# Patient Record
Sex: Female | Born: 1955 | Race: White | Hispanic: No | Smoking: Former smoker
Health system: Southern US, Community
[De-identification: ages and names within clinical notes are randomized; demographics above are authoritative.]

## PROBLEM LIST (undated history)

## (undated) DIAGNOSIS — K219 Gastro-esophageal reflux disease without esophagitis: Secondary | ICD-10-CM

## (undated) DIAGNOSIS — I739 Peripheral vascular disease, unspecified: Secondary | ICD-10-CM

## (undated) DIAGNOSIS — I219 Acute myocardial infarction, unspecified: Secondary | ICD-10-CM

## (undated) DIAGNOSIS — I509 Heart failure, unspecified: Secondary | ICD-10-CM

## (undated) DIAGNOSIS — I2510 Atherosclerotic heart disease of native coronary artery without angina pectoris: Secondary | ICD-10-CM

## (undated) DIAGNOSIS — I10 Essential (primary) hypertension: Secondary | ICD-10-CM

## (undated) DIAGNOSIS — E785 Hyperlipidemia, unspecified: Secondary | ICD-10-CM

## (undated) DIAGNOSIS — I5020 Unspecified systolic (congestive) heart failure: Secondary | ICD-10-CM

## (undated) DIAGNOSIS — I429 Cardiomyopathy, unspecified: Secondary | ICD-10-CM

## (undated) DIAGNOSIS — E119 Type 2 diabetes mellitus without complications: Secondary | ICD-10-CM

## (undated) HISTORY — DX: Heart failure, unspecified: I50.9

## (undated) HISTORY — DX: Atherosclerotic heart disease of native coronary artery without angina pectoris: I25.10

## (undated) HISTORY — PX: TONSILLECTOMY: SUR1361

## (undated) HISTORY — PX: CORONARY ARTERY BYPASS GRAFT: SHX141

## (undated) HISTORY — DX: Gastro-esophageal reflux disease without esophagitis: K21.9

## (undated) HISTORY — PX: CARDIAC DEFIBRILLATOR PLACEMENT: SHX171

## (undated) HISTORY — PX: PERCUTANEOUS CORONARY STENT INTERVENTION (PCI-S): SHX6016

## (undated) HISTORY — PX: CARDIAC CATHETERIZATION: SHX172

## (undated) HISTORY — DX: Essential (primary) hypertension: I10

## (undated) HISTORY — PX: CORONARY STENT PLACEMENT: SHX1402

## (undated) HISTORY — DX: Hyperlipidemia, unspecified: E78.5

## (undated) HISTORY — DX: Peripheral vascular disease, unspecified: I73.9

## (undated) HISTORY — PX: CATARACT EXTRACTION: SUR2

## (undated) HISTORY — DX: Unspecified systolic (congestive) heart failure: I50.20

## (undated) HISTORY — DX: Cardiomyopathy, unspecified: I42.9

## (undated) HISTORY — PX: COMBINED AUGMENTATION MAMMAPLASTY AND ABDOMINOPLASTY: SUR291

## (undated) HISTORY — DX: Type 2 diabetes mellitus without complications: E11.9

## (undated) HISTORY — DX: Acute myocardial infarction, unspecified: I21.9

---

## 2014-04-10 HISTORY — PX: BREAST BIOPSY: SHX20

## 2014-11-03 DIAGNOSIS — E1159 Type 2 diabetes mellitus with other circulatory complications: Secondary | ICD-10-CM | POA: Insufficient documentation

## 2014-11-03 DIAGNOSIS — I10 Essential (primary) hypertension: Secondary | ICD-10-CM | POA: Insufficient documentation

## 2015-04-11 HISTORY — PX: REDUCTION MAMMAPLASTY: SUR839

## 2017-08-08 HISTORY — PX: CORONARY ARTERY BYPASS GRAFT: SHX141

## 2018-12-30 ENCOUNTER — Ambulatory Visit: Payer: Self-pay | Admitting: Internal Medicine

## 2019-01-02 ENCOUNTER — Encounter: Payer: Self-pay | Admitting: *Deleted

## 2019-01-03 ENCOUNTER — Ambulatory Visit (INDEPENDENT_AMBULATORY_CARE_PROVIDER_SITE_OTHER): Payer: 59 | Admitting: Internal Medicine

## 2019-01-03 ENCOUNTER — Encounter: Payer: Self-pay | Admitting: Internal Medicine

## 2019-01-03 ENCOUNTER — Encounter: Payer: Self-pay | Admitting: Cardiology

## 2019-01-03 ENCOUNTER — Other Ambulatory Visit: Payer: Self-pay

## 2019-01-03 ENCOUNTER — Ambulatory Visit (INDEPENDENT_AMBULATORY_CARE_PROVIDER_SITE_OTHER): Payer: 59 | Admitting: Cardiology

## 2019-01-03 VITALS — BP 112/80 | HR 77 | Ht 63.0 in | Wt 181.0 lb

## 2019-01-03 DIAGNOSIS — E119 Type 2 diabetes mellitus without complications: Secondary | ICD-10-CM

## 2019-01-03 DIAGNOSIS — E7800 Pure hypercholesterolemia, unspecified: Secondary | ICD-10-CM

## 2019-01-03 DIAGNOSIS — Z95810 Presence of automatic (implantable) cardiac defibrillator: Secondary | ICD-10-CM | POA: Diagnosis not present

## 2019-01-03 DIAGNOSIS — I509 Heart failure, unspecified: Secondary | ICD-10-CM | POA: Diagnosis not present

## 2019-01-03 DIAGNOSIS — I2510 Atherosclerotic heart disease of native coronary artery without angina pectoris: Secondary | ICD-10-CM

## 2019-01-03 DIAGNOSIS — I252 Old myocardial infarction: Secondary | ICD-10-CM | POA: Insufficient documentation

## 2019-01-03 DIAGNOSIS — I10 Essential (primary) hypertension: Secondary | ICD-10-CM | POA: Insufficient documentation

## 2019-01-03 DIAGNOSIS — I429 Cardiomyopathy, unspecified: Secondary | ICD-10-CM

## 2019-01-03 DIAGNOSIS — I219 Acute myocardial infarction, unspecified: Secondary | ICD-10-CM | POA: Insufficient documentation

## 2019-01-03 DIAGNOSIS — K219 Gastro-esophageal reflux disease without esophagitis: Secondary | ICD-10-CM

## 2019-01-03 DIAGNOSIS — E785 Hyperlipidemia, unspecified: Secondary | ICD-10-CM | POA: Insufficient documentation

## 2019-01-03 NOTE — Assessment & Plan Note (Signed)
Compensated Reinforced DASH diet and exercise for weight loss Will schedule lab visit for CMET in 1 month Continue Carvedilol, Lisinopril and Furosemide

## 2019-01-03 NOTE — Assessment & Plan Note (Signed)
She will schedule lab visit in 1 month for Lipid and CMET Continue Rosuvastatin Encouraged low fat diet

## 2019-01-03 NOTE — Patient Instructions (Signed)

## 2019-01-03 NOTE — Assessment & Plan Note (Signed)
Controlled on Carvedilol, Furosemide and Lisinopril Reinforced DASH diet and exercise for weight loss Lab only appt in 1 month for CMET

## 2019-01-03 NOTE — Assessment & Plan Note (Signed)
Controlled on Carvedilol, Furosemide and Lisinopril Reinforced DASH diet and exercise for weight loss Lab only appt in 1 month for CMET 

## 2019-01-03 NOTE — Assessment & Plan Note (Signed)
Will have her schedule lab only appt in 1 month for A1c No microalbumin secondary to ACEI therapy Encouraged her to consume a low carb diet and exercise for weight loss Continue Metformin, Glimepiride and Invokana Foot exam today Eye exam UTD Flu shot today Will see if she has had a pneumonia vaccine

## 2019-01-03 NOTE — Assessment & Plan Note (Signed)
No angina Continue Rosuvastatin and ASA Established with cardiology today

## 2019-01-03 NOTE — Assessment & Plan Note (Signed)
Discussed avoiding foods that trigger your reflux Discussed how weight loss could help improve symptoms. Continue Tums PRN

## 2019-01-03 NOTE — Patient Instructions (Signed)
Medication Instructions:  Your physician recommends that you continue on your current medications as directed. Please refer to the Current Medication list given to you today.  If you need a refill on your cardiac medications before your next appointment, please call your pharmacy.   Lab work: NONE If you have labs (blood work) drawn today and your tests are completely normal, you will receive your results only by: Marland Kitchen MyChart Message (if you have MyChart) OR . A paper copy in the mail If you have any lab test that is abnormal or we need to change your treatment, we will call you to review the results.  Testing/Procedures: Your physician has requested that you have an echocardiogram. Echocardiography is a painless test that uses sound waves to create images of your heart. It provides your doctor with information about the size and shape of your heart and how well your heart's chambers and valves are working. This procedure takes approximately one hour. There are no restrictions for this procedure. You may get an IV, if needed, to receive an ultrasound enhancing agent through to better visualize your heart.    Follow-Up: You have been referred to Cardiac Electrophysiology - Dr Graciela Husbands.   At [facility], you and your health needs are our priority.  As part of our continuing mission to provide you with exceptional heart care, we have created designated Provider Care Teams.  These Care Teams include your primary Cardiologist (physician) and Advanced Practice Providers (APPs -  Physician Assistants and Nurse Practitioners) who all work together to provide you with the care you need, when you need it. You will need a follow up appointment in 2 months.   You may see Debbe Odea, MD or one of the following Advanced Practice Providers on your designated Care Team:   Nicolasa Ducking, NP Eula Listen, PA-C . Marisue Ivan, PA-C    Echocardiogram An echocardiogram is a procedure that uses  painless sound waves (ultrasound) to produce an image of the heart. Images from an echocardiogram can provide important information about:  Signs of coronary artery disease (CAD).  Aneurysm detection. An aneurysm is a weak or damaged part of an artery wall that bulges out from the normal force of blood pumping through the body.  Heart size and shape. Changes in the size or shape of the heart can be associated with certain conditions, including heart failure, aneurysm, and CAD.  Heart muscle function.  Heart valve function.  Signs of a past heart attack.  Fluid buildup around the heart.  Thickening of the heart muscle.  A tumor or infectious growth around the heart valves. Tell a health care provider about:  Any allergies you have.  All medicines you are taking, including vitamins, herbs, eye drops, creams, and over-the-counter medicines.  Any blood disorders you have.  Any surgeries you have had.  Any medical conditions you have.  Whether you are pregnant or may be pregnant. What are the risks? Generally, this is a safe procedure. However, problems may occur, including:  Allergic reaction to dye (contrast) that may be used during the procedure. What happens before the procedure? No specific preparation is needed. You may eat and drink normally. What happens during the procedure?   An IV tube may be inserted into one of your veins.  You may receive contrast through this tube. A contrast is an injection that improves the quality of the pictures from your heart.  A gel will be applied to your chest.  A wand-like tool (  transducer) will be moved over your chest. The gel will help to transmit the sound waves from the transducer.  The sound waves will harmlessly bounce off of your heart to allow the heart images to be captured in real-time motion. The images will be recorded on a computer. The procedure may vary among health care providers and hospitals. What happens after  the procedure?  You may return to your normal, everyday life, including diet, activities, and medicines, unless your health care provider tells you not to do that. Summary  An echocardiogram is a procedure that uses painless sound waves (ultrasound) to produce an image of the heart.  Images from an echocardiogram can provide important information about the size and shape of your heart, heart muscle function, heart valve function, and fluid buildup around your heart.  You do not need to do anything to prepare before this procedure. You may eat and drink normally.  After the echocardiogram is completed, you may return to your normal, everyday life, unless your health care provider tells you not to do that. This information is not intended to replace advice given to you by your health care provider. Make sure you discuss any questions you have with your health care provider. Document Released: 03/24/2000 Document Revised: 07/18/2018 Document Reviewed: 04/29/2016 Elsevier Patient Education  2020 [employer].

## 2019-01-03 NOTE — Progress Notes (Signed)
HPI  HTN with Cardiomyopathy: Her BP today is 110/68. She is taking Carvedilol, Furosemide and Lisinopril as prescribed. There is no ECG on file.  HLD with CAD, PAD s/p MI with Stents: She denies myalgias on Rosuvastatin. She is taking Carvedilol and ASA as well. She follows with cardiology.   CHF: She denies increased edema or SOB. She is taking Lisinopril, Furosemide and Carvedilol as prescribed. She is following with cardiology.   GERD: Triggered by spicy foods. She used to take Omeprazole but no longer take its. She takes Tums as needed with good relief.  DM 2: Her last A1C was 7.6%, 10/2018. She does not check her sugars routinely. She is taking Metformin, Glimepiride and Farxiga as prescribed. She checks her feet routinely. Her last eye exam was 05/2018.  Flu: 2019 Tetanus: unsure Pneumovax: unsure Shingrix: 2020 Mammogram: 2019 Pap Smear: mammogram Bone Density:> 2 years ago Colon Screening: > 10 years ago Vision Screening: annually Dentist: annually  Past Medical History:  Diagnosis Date  . Cardiomyopathy (HCC)   . CHF (congestive heart failure) (HCC)    Class I  . Coronary artery disease   . Diabetes mellitus without complication (HCC)   . GERD (gastroesophageal reflux disease)   . HFrEF (heart failure with reduced ejection fraction) (HCC)   . Hyperlipidemia   . Hypertension   . MI (myocardial infarction) (HCC)   . PAD (peripheral artery disease) (HCC)     Current Outpatient Medications  Medication Sig Dispense Refill  . aspirin EC 81 MG tablet Take 81 mg by mouth daily.    . carvedilol (COREG) 6.25 MG tablet Take 6.25 mg by mouth 2 (two) times daily with a meal.    . dapagliflozin propanediol (FARXIGA) 10 MG TABS tablet Take 10 mg by mouth daily.    . furosemide (LASIX) 40 MG tablet Take 40 mg by mouth every other day.    Marland Kitchen glimepiride (AMARYL) 2 MG tablet Take 2 mg by mouth daily with breakfast.    . lisinopril (ZESTRIL) 5 MG tablet Take 5 mg by mouth daily.     . metFORMIN (GLUCOPHAGE) 1000 MG tablet Take 1,000 mg by mouth 2 (two) times daily with a meal.    . rosuvastatin (CRESTOR) 40 MG tablet Take 40 mg by mouth daily.     No current facility-administered medications for this visit.     Allergies  Allergen Reactions  . Brilinta [Ticagrelor]     Family History  Problem Relation Age of Onset  . Heart attack Mother   . COPD Father   . Thyroid cancer Sister   . Breast cancer Sister   . Diabetes Sister   . Diabetes Brother     Social History   Socioeconomic History  . Marital status: Widowed    Spouse name: Not on file  . Number of children: Not on file  . Years of education: Not on file  . Highest education level: Not on file  Occupational History  . Not on file  Social Needs  . Financial resource strain: Not on file  . Food insecurity    Worry: Not on file    Inability: Not on file  . Transportation needs    Medical: Not on file    Non-medical: Not on file  Tobacco Use  . Smoking status: Former Smoker    Packs/day: 1.00    Years: 33.00    Pack years: 33.00  . Smokeless tobacco: Never Used  Substance and Sexual Activity  . Alcohol  use: Yes  . Drug use: Never  . Sexual activity: Not on file  Lifestyle  . Physical activity    Days per week: Not on file    Minutes per session: Not on file  . Stress: Not on file  Relationships  . Social Herbalist on phone: Not on file    Gets together: Not on file    Attends religious service: Not on file    Active member of club or organization: Not on file    Attends meetings of clubs or organizations: Not on file    Relationship status: Not on file  . Intimate partner violence    Fear of current or ex partner: Not on file    Emotionally abused: Not on file    Physically abused: Not on file    Forced sexual activity: Not on file  Other Topics Concern  . Not on file  Social History Narrative  . Not on file    ROS:  Constitutional: Denies fever, malaise,  fatigue, headache or abrupt weight changes.  HEENT: Denies eye pain, eye redness, ear pain, ringing in the ears, wax buildup, runny nose, nasal congestion, bloody nose, or sore throat. Respiratory: Denies difficulty breathing, shortness of breath, cough or sputum production.   Cardiovascular: Denies chest pain, chest tightness, palpitations or swelling in the hands or feet.  Gastrointestinal: Denies abdominal pain, bloating, constipation, diarrhea or blood in the stool.  GU: Denies frequency, urgency, pain with urination, blood in urine, odor or discharge. Musculoskeletal: Denies decrease in range of motion, difficulty with gait, muscle pain or joint pain and swelling.  Skin: Denies redness, rashes, lesions or ulcercations.  Neurological: Denies dizziness, difficulty with memory, difficulty with speech or problems with balance and coordination.  Psych: Denies anxiety, depression, SI/HI.  No other specific complaints in a complete review of systems (except as listed in HPI above).  PE:  BP 110/68   Pulse 85   Temp 98.3 F (36.8 C) (Temporal)   Ht 5' 3.25" (1.607 m)   Wt 181 lb (82.1 kg)   SpO2 98%   BMI 31.81 kg/m   Wt Readings from Last 3 Encounters:  01/03/19 181 lb (82.1 kg)    General: Appears her stated age, obese, in NAD. Skin: Dry and intact. Cardiovascular: Normal rate and rhythm. S1,S2 noted.  No murmur, rubs or gallops noted. No JVD or BLE edema. No carotid bruits noted. Pulmonary/Chest: Normal effort and positive vesicular breath sounds. No respiratory distress. No wheezes, rales or ronchi noted.  Abdomen: Soft and nontender. Normal bowel sounds MSK: No difficulty with gait. Neurological: Alert and oriented. Sensation intact to BLE. Psychiatric: Mood and affect normal. Behavior is normal. Judgment and thought content normal.    Assessment and Plan:

## 2019-01-03 NOTE — Progress Notes (Signed)
Cardiology Office Note:    Date:  01/03/2019   ID:  Monique Franklin, DOB 12/27/1955, MRN [id number]  PCP:  Lorre Munroe, NP  Cardiologist:  Debbe Odea, MD  Electrophysiologist:  None   Referring MD: No ref. provider found   Chief Complaint  Patient presents with  . New Patient (Initial Visit)    self referral. Patietn wants to establish care. Meds reviewed verbally with patient.    History of Present Illness:    Monique Franklin is a 63 y.o. female with a hx of hypertension, hyperlipidemia, CAD, PCI to LAD and LCx(5 stents total), status post CABG x2 (LIMA to LAD, SVG to OM 2019), HFrEF EF 31%, s/p ICD 2020(St. Jude's device), PAD(left femoral thrombo-endarterectomy with patch angioplasty 12/2017) who presents to establish care.  She used to be followed at [facility] and recently moved into the area.  She states having CAD and prior 5 stents.  Also had CABG last year and during the procedure her left femoral complication requiring left femoral patch angioplasty.  Earlier this year she had an ICD placed due to low EF with ejection fraction of 31%.  She is a former smoker for about 20 years.  She otherwise feels great, is able to walk about 2 miles with intermittent breaks.  She denies edema, takes her Lasix every other day.  Past Medical History:  Diagnosis Date  . Cardiomyopathy (HCC)   . CHF (congestive heart failure) (HCC)    Class I  . Coronary artery disease   . Diabetes mellitus without complication (HCC)   . GERD (gastroesophageal reflux disease)   . HFrEF (heart failure with reduced ejection fraction) (HCC)   . Hyperlipidemia   . Hypertension   . MI (myocardial infarction) (HCC)   . PAD (peripheral artery disease) (HCC)     Past Surgical History:  Procedure Laterality Date  . BREAST BIOPSY    . CARDIAC CATHETERIZATION    . CARDIAC DEFIBRILLATOR PLACEMENT    . COMBINED AUGMENTATION MAMMAPLASTY AND ABDOMINOPLASTY    . CORONARY ARTERY BYPASS GRAFT  08/2017    X2 with LIMA to LAD; 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;Severe 3 vessel CAD-PCI to LAD and LC x(5stents) Coronary dissection of LM to LAD   . CORONARY ARTERY BYPASS GRAFT    . CORONARY STENT PLACEMENT    . PERCUTANEOUS CORONARY STENT INTERVENTION (PCI-S)    . TONSILLECTOMY      Current Medications: Current Meds  Medication Sig  . aspirin EC 81 MG tablet Take 81 mg by mouth daily.  . carvedilol (COREG) 6.25 MG tablet Take 6.25 mg by mouth 2 (two) times daily with a meal.  . dapagliflozin propanediol (FARXIGA) 10 MG TABS tablet Take 10 mg by mouth daily.  . furosemide (LASIX) 40 MG tablet Take 40 mg by mouth.  Marland Kitchen glimepiride (AMARYL) 2 MG tablet Take 2 mg by mouth daily with breakfast.  . lisinopril (ZESTRIL) 5 MG tablet Take 5 mg by mouth daily.  . metFORMIN (GLUCOPHAGE) 1000 MG tablet Take 1,000 mg by mouth 2 (two) times daily with a meal.  . rosuvastatin (CRESTOR) 40 MG tablet Take 40 mg by mouth daily.     Allergies:   Brilinta [ticagrelor]   Social History   Socioeconomic History  . Marital status: Widowed    Spouse name: Not on file  . Number of children: Not on file  . Years of education: Not on file  . Highest education level: Not on file  Occupational History  . Not  on file  Social Needs  . Financial resource strain: Not on file  . Food insecurity    Worry: Not on file    Inability: Not on file  . Transportation needs    Medical: Not on file    Non-medical: Not on file  Tobacco Use  . Smoking status: Former Smoker    Packs/day: 1.00    Years: 33.00    Pack years: 33.00  . Smokeless tobacco: Never Used  Substance and Sexual Activity  . Alcohol use: Yes  . Drug use: Never  . Sexual activity: Not on file  Lifestyle  . Physical activity    Days per week: Not on file    Minutes per session: Not on file  . Stress: Not on file  Relationships  . Social Musicianconnections    Talks on phone: Not on file    Gets together: Not on file    Attends religious service: Not on file     Active member of club or organization: Not on file    Attends meetings of clubs or organizations: Not on file    Relationship status: Not on file  Other Topics Concern  . Not on file  Social History Narrative  . Not on file     Family History: The patient's family history includes Breast cancer in her sister; COPD in her father; Diabetes in her brother and sister; Heart attack in her mother; Thyroid cancer in her sister.  ROS:   Please see the history of present illness.     All other systems reviewed and are negative.  EKGs/Labs/Other Studies Reviewed:    The following studies were reviewed today:   EKG:  EKG is  ordered today.  The ekg ordered today demonstrates normal sinus rhythm old inferior infarct.  Low voltage in precordial leads.  Recent Labs: No results found for requested labs within last 8760 hours.  Recent Lipid Panel No results found for: CHOL, TRIG, HDL, CHOLHDL, VLDL, LDLCALC, LDLDIRECT  Physical Exam:    VS:  BP 112/80 (BP Location: Right Arm, Patient Position: Sitting, Cuff Size: Normal)   Pulse 77   Ht 5\' 3"  (1.6 m)   Wt 181 lb (82.1 kg)   BMI 32.06 kg/m     Wt Readings from Last 3 Encounters:  01/03/19 181 lb (82.1 kg)     GEN:  Well nourished, well developed in no acute distress HEENT: Normal NECK: No JVD; No carotid bruits LYMPHATICS: No lymphadenopathy CARDIAC: RRR, no murmurs, rubs, gallops RESPIRATORY:  Clear to auscultation without rales, wheezing or rhonchi  ABDOMEN: Soft, non-tender, non-distended MUSCULOSKELETAL:  No edema; No deformity  SKIN: Warm and dry NEUROLOGIC:  Alert and oriented x 3 PSYCHIATRIC:  Normal affect   ASSESSMENT:   Patient appears euvolemic, 1. Congestive heart failure, unspecified HF chronicity, unspecified heart failure type (HCC)   2. Coronary artery disease involving native heart, angina presence unspecified, unspecified vessel or lesion type   3. ICD (implantable cardioverter-defibrillator) in place    4. Hypertension, unspecified type   5. Pure hypercholesterolemia    PLAN:    In order of problems listed above:  1. Get echocardiogram.  Continue Coreg 6.25 twice daily, lisinopril 5 daily, Lasix 40 every other day. 2. Continue aspirin 81 mg, Crestor 40 mg daily. 3. Referral to EP for device checks.  St. Jude's device. 4. Blood pressure well controlled.  Continue current meds for blood pressure. 5. Continue Crestor 40 mg daily.  Follow-up after echo  Medication Adjustments/Labs and Tests Ordered: Current medicines are reviewed at length with the patient today.  Concerns regarding medicines are outlined above.  Orders Placed This Encounter  Procedures  . Ambulatory referral to Cardiac Electrophysiology  . EKG 12-Lead  . ECHOCARDIOGRAM COMPLETE   No orders of the defined types were placed in this encounter.   Patient Instructions  Medication Instructions:  Your physician recommends that you continue on your current medications as directed. Please refer to the Current Medication list given to you today.  If you need a refill on your cardiac medications before your next appointment, please call your pharmacy.   Lab work: NONE If you have labs (blood work) drawn today and your tests are completely normal, you will receive your results only by: Marland Kitchen MyChart Message (if you have MyChart) OR . A paper copy in the mail If you have any lab test that is abnormal or we need to change your treatment, we will call you to review the results.  Testing/Procedures: Your physician has requested that you have an echocardiogram. Echocardiography is a painless test that uses sound waves to create images of your heart. It provides your doctor with information about the size and shape of your heart and how well your heart's chambers and valves are working. This procedure takes approximately one hour. There are no restrictions for this procedure. You may get an IV, if needed, to receive an ultrasound  enhancing agent through to better visualize your heart.    Follow-Up: You have been referred to Cardiac Electrophysiology - Dr Graciela Husbands.   At [facility], you and your health needs are our priority.  As part of our continuing mission to provide you with exceptional heart care, we have created designated Provider Care Teams.  These Care Teams include your primary Cardiologist (physician) and Advanced Practice Providers (APPs -  Physician Assistants and Nurse Practitioners) who all work together to provide you with the care you need, when you need it. You will need a follow up appointment in 2 months.   You may see Debbe Odea, MD or one of the following Advanced Practice Providers on your designated Care Team:   Nicolasa Ducking, NP Eula Listen, PA-C . Marisue Ivan, PA-C    Echocardiogram An echocardiogram is a procedure that uses painless sound waves (ultrasound) to produce an image of the heart. Images from an echocardiogram can provide important information about:  Signs of coronary artery disease (CAD).  Aneurysm detection. An aneurysm is a weak or damaged part of an artery wall that bulges out from the normal force of blood pumping through the body.  Heart size and shape. Changes in the size or shape of the heart can be associated with certain conditions, including heart failure, aneurysm, and CAD.  Heart muscle function.  Heart valve function.  Signs of a past heart attack.  Fluid buildup around the heart.  Thickening of the heart muscle.  A tumor or infectious growth around the heart valves. Tell a health care provider about:  Any allergies you have.  All medicines you are taking, including vitamins, herbs, eye drops, creams, and over-the-counter medicines.  Any blood disorders you have.  Any surgeries you have had.  Any medical conditions you have.  Whether you are pregnant or may be pregnant. What are the risks? Generally, this is a safe procedure.  However, problems may occur, including:  Allergic reaction to dye (contrast) that may be used during the procedure. What happens before the procedure? No specific  preparation is needed. You may eat and drink normally. What happens during the procedure?   An IV tube may be inserted into one of your veins.  You may receive contrast through this tube. A contrast is an injection that improves the quality of the pictures from your heart.  A gel will be applied to your chest.  A wand-like tool (transducer) will be moved over your chest. The gel will help to transmit the sound waves from the transducer.  The sound waves will harmlessly bounce off of your heart to allow the heart images to be captured in real-time motion. The images will be recorded on a computer. The procedure may vary among health care providers and hospitals. What happens after the procedure?  You may return to your normal, everyday life, including diet, activities, and medicines, unless your health care provider tells you not to do that. Summary  An echocardiogram is a procedure that uses painless sound waves (ultrasound) to produce an image of the heart.  Images from an echocardiogram can provide important information about the size and shape of your heart, heart muscle function, heart valve function, and fluid buildup around your heart.  You do not need to do anything to prepare before this procedure. You may eat and drink normally.  After the echocardiogram is completed, you may return to your normal, everyday life, unless your health care provider tells you not to do that. This information is not intended to replace advice given to you by your health care provider. Make sure you discuss any questions you have with your health care provider. Document Released: 03/24/2000 Document Revised: 07/18/2018 Document Reviewed: 04/29/2016 Elsevier Patient Education  2020 [employer].       Signed, Kate Sable, MD   01/03/2019 10:08 AM    [place]

## 2019-01-22 ENCOUNTER — Other Ambulatory Visit (INDEPENDENT_AMBULATORY_CARE_PROVIDER_SITE_OTHER): Payer: 59

## 2019-01-22 ENCOUNTER — Other Ambulatory Visit: Payer: Self-pay

## 2019-01-22 DIAGNOSIS — E119 Type 2 diabetes mellitus without complications: Secondary | ICD-10-CM

## 2019-01-22 DIAGNOSIS — E782 Mixed hyperlipidemia: Secondary | ICD-10-CM

## 2019-01-22 LAB — LIPID PANEL
Cholesterol: 153 mg/dL (ref 0–200)
HDL: 56.6 mg/dL (ref >39.00)
NonHDL: 96.75
Total CHOL/HDL Ratio: 3
Triglycerides: 285 mg/dL — ABNORMAL HIGH (ref 0.0–149.0)
VLDL: 57 mg/dL — ABNORMAL HIGH (ref 0.0–40.0)

## 2019-01-22 LAB — CBC
HCT: 36.6 % (ref 36.0–46.0)
Hemoglobin: 12.2 g/dL (ref 12.0–15.0)
MCHC: 33.4 g/dL (ref 30.0–36.0)
MCV: 85.8 fl (ref 78.0–100.0)
Platelets: 247 10*3/uL (ref 150.0–400.0)
RBC: 4.26 Mil/uL (ref 3.87–5.11)
RDW: 15.4 % (ref 11.5–15.5)
WBC: 7.3 10*3/uL (ref 4.0–10.5)

## 2019-01-22 LAB — COMPREHENSIVE METABOLIC PANEL
ALT: 13 U/L (ref 0–35)
AST: 13 U/L (ref 0–37)
Albumin: 4 g/dL (ref 3.5–5.2)
Alkaline Phosphatase: 53 U/L (ref 39–117)
BUN: 18 mg/dL (ref 6–23)
CO2: 26 mEq/L (ref 19–32)
Calcium: 9.6 mg/dL (ref 8.4–10.5)
Chloride: 104 mEq/L (ref 96–112)
Creatinine, Ser: 0.98 mg/dL (ref 0.40–1.20)
GFR: 57.21 mL/min — ABNORMAL LOW (ref >60.00)
Glucose, Bld: 172 mg/dL — ABNORMAL HIGH (ref 70–99)
Potassium: 4.6 mEq/L (ref 3.5–5.1)
Sodium: 139 mEq/L (ref 135–145)
Total Bilirubin: 0.3 mg/dL (ref 0.2–1.2)
Total Protein: 7.1 g/dL (ref 6.0–8.3)

## 2019-01-22 LAB — LDL CHOLESTEROL, DIRECT: Direct LDL: 68 mg/dL

## 2019-01-22 LAB — HEMOGLOBIN A1C: Hgb A1c MFr Bld: 7 % — ABNORMAL HIGH (ref 4.6–6.5)

## 2019-01-23 MED ORDER — FENOFIBRATE 54 MG PO TABS: 54.0000 mg | ORAL_TABLET | Freq: Every day | ORAL | 2 refills | Status: DC

## 2019-01-23 NOTE — Addendum Note (Signed)
Addended by: Jearld Fenton on: 01/23/2019 09:28 AM   Modules accepted: Orders

## 2019-01-25 ENCOUNTER — Encounter: Payer: Self-pay | Admitting: Internal Medicine

## 2019-01-28 ENCOUNTER — Ambulatory Visit (INDEPENDENT_AMBULATORY_CARE_PROVIDER_SITE_OTHER): Payer: 59 | Admitting: Internal Medicine

## 2019-01-28 ENCOUNTER — Encounter: Payer: Self-pay | Admitting: Internal Medicine

## 2019-01-28 ENCOUNTER — Other Ambulatory Visit: Payer: Self-pay

## 2019-01-28 VITALS — BP 106/70 | HR 88 | Ht 63.0 in | Wt 184.5 lb

## 2019-01-28 DIAGNOSIS — I509 Heart failure, unspecified: Secondary | ICD-10-CM | POA: Diagnosis not present

## 2019-01-28 DIAGNOSIS — Z79899 Other long term (current) drug therapy: Secondary | ICD-10-CM | POA: Diagnosis not present

## 2019-01-28 DIAGNOSIS — Z95810 Presence of automatic (implantable) cardiac defibrillator: Secondary | ICD-10-CM | POA: Diagnosis not present

## 2019-01-28 DIAGNOSIS — I429 Cardiomyopathy, unspecified: Secondary | ICD-10-CM

## 2019-01-28 LAB — CUP PACEART INCLINIC DEVICE CHECK
Battery Remaining Longevity: 91 mo
Brady Statistic RV Percent Paced: 0 %
Date Time Interrogation Session: 20201020115817
HighPow Impedance: 74 Ohm
Implantable Lead Implant Date: 20200707
Implantable Lead Location: 753860
Implantable Pulse Generator Implant Date: 20200707
Lead Channel Impedance Value: 650 Ohm
Lead Channel Pacing Threshold Amplitude: 0.5 V
Lead Channel Pacing Threshold Pulse Width: 0.5 ms
Lead Channel Sensing Intrinsic Amplitude: 11.7 mV
Lead Channel Setting Pacing Amplitude: 2.5 V
Lead Channel Setting Pacing Pulse Width: 0.5 ms
Lead Channel Setting Sensing Sensitivity: 0.5 mV
Pulse Gen Serial Number: 9816687

## 2019-01-28 MED ORDER — SPIRONOLACTONE 25 MG PO TABS: 12.5000 mg | ORAL_TABLET | Freq: Every day | ORAL | 3 refills | Status: DC

## 2019-01-28 NOTE — Progress Notes (Signed)
ELECTROPHYSIOLOGY CONSULT NOTE  Patient ID: Monique Franklin, MRN: [id number], DOB/AGE: January 26, 1956 63 y.o. Admit date: (Not on file) Date of Consult: 01/28/2019  Primary Physician: Lorre Munroe, NP Primary Cardiologist: *BAE     Monique Franklin is a 63 y.o. female who is being seen today for the evaluation of ICD at the request of Dr BAE.    HPI Monique Franklin is a 63 y.o. female seen to establish care for previously implanted ICD-Saint Jude (2020) having moved from [place].  Apparently device was implanted for primary prevention for her ejection fraction of 31%; echocardiogram 5/20. Outpatient records were reviewed.  Mild shortness of breath.  No chest pain.  No peripheral edema.  Some orthostatic lightheadedness.    Date Cr K Hgb  10/20 0.98 4.6 12.2           She has a history of ischemic heart disease with prior bypass surgery; multiple prior stents.    Past Medical History:  Diagnosis Date  . Cardiomyopathy (HCC)   . CHF (congestive heart failure) (HCC)    Class I  . Coronary artery disease   . Diabetes mellitus without complication (HCC)   . GERD (gastroesophageal reflux disease)   . HFrEF (heart failure with reduced ejection fraction) (HCC)   . Hyperlipidemia   . Hypertension   . MI (myocardial infarction) (HCC)   . PAD (peripheral artery disease) [facility])       Surgical History:  Past Surgical History:  Procedure Laterality Date  . BREAST BIOPSY    . CARDIAC CATHETERIZATION    . CARDIAC DEFIBRILLATOR PLACEMENT    . COMBINED AUGMENTATION MAMMAPLASTY AND ABDOMINOPLASTY    . CORONARY ARTERY BYPASS GRAFT  08/2017   X2 with LIMA to LAD; 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;Severe 3 vessel CAD-PCI to LAD and LC x(5stents) Coronary dissection of LM to LAD   . CORONARY ARTERY BYPASS GRAFT    . CORONARY STENT PLACEMENT    . PERCUTANEOUS CORONARY STENT INTERVENTION (PCI-S)    . TONSILLECTOMY       Home Meds: Current Meds  Medication Sig  . aspirin EC 81  MG tablet Take 81 mg by mouth daily.  . carvedilol (COREG) 6.25 MG tablet Take 6.25 mg by mouth 2 (two) times daily with a meal.  . dapagliflozin propanediol (FARXIGA) 10 MG TABS tablet Take 10 mg by mouth daily.  . fenofibrate 54 MG tablet Take 1 tablet (54 mg total) by mouth daily.  . furosemide (LASIX) 40 MG tablet Take 40 mg by mouth every other day.  Marland Kitchen glimepiride (AMARYL) 2 MG tablet Take 2 mg by mouth daily with breakfast.  . lisinopril (ZESTRIL) 5 MG tablet Take 5 mg by mouth daily.  . metFORMIN (GLUCOPHAGE) 1000 MG tablet Take 1,000 mg by mouth 2 (two) times daily with a meal.  . rosuvastatin (CRESTOR) 40 MG tablet Take 40 mg by mouth daily.    Allergies:  Allergies  Allergen Reactions  . Brilinta [Ticagrelor]     Social History   Socioeconomic History  . Marital status: Widowed    Spouse name: Not on file  . Number of children: Not on file  . Years of education: Not on file  . Highest education level: Not on file  Occupational History  . Not on file  Social Needs  . Financial resource strain: Not on file  . Food insecurity    Worry: Not on file    Inability: Not on file  . Transportation needs  Medical: Not on file    Non-medical: Not on file  Tobacco Use  . Smoking status: Former Smoker    Packs/day: 1.00    Years: 33.00    Pack years: 33.00  . Smokeless tobacco: Never Used  Substance and Sexual Activity  . Alcohol use: Yes    Comment: occasional  . Drug use: Never  . Sexual activity: Not on file  Lifestyle  . Physical activity    Days per week: Not on file    Minutes per session: Not on file  . Stress: Not on file  Relationships  . Social Herbalist on phone: Not on file    Gets together: Not on file    Attends religious service: Not on file    Active member of club or organization: Not on file    Attends meetings of clubs or organizations: Not on file    Relationship status: Not on file  . Intimate partner violence    Fear of  current or ex partner: Not on file    Emotionally abused: Not on file    Physically abused: Not on file    Forced sexual activity: Not on file  Other Topics Concern  . Not on file  Social History Narrative  . Not on file     Family History  Problem Relation Age of Onset  . Heart attack Mother   . COPD Father   . Thyroid cancer Sister   . Breast cancer Sister   . Diabetes Sister   . Diabetes Brother      ROS:  Please see the history of present illness.     All other systems reviewed and negative.    Physical Exam: Blood pressure 106/70, pulse 88, height 5\' 3"  (1.6 m), weight 184 lb 8 oz (83.7 kg), SpO2 99 %. General: Well developed, well nourished female in no acute distress. Head: Normocephalic, atraumatic, sclera non-icteric, no xanthomas, nares are without discharge. EENT: normal  Lymph Nodes:  none Neck: Negative for carotid bruits. JVD not elevated. Back:without scoliosis kyphosi Lungs: Clear bilaterally to auscultation without wheezes, rales, or rhonchi. Breathing is unlabored. Heart: RRR with S1 S2. No  /6 systolic murmur . No rubs, or gallops appreciated. Abdomen: Soft, non-tender, non-distended with normoactive bowel sounds. No hepatomegaly. No rebound/guarding. No obvious abdominal masses. Msk:  Strength and tone appear normal for age. Extremities: No clubbing or cyan or edema.  Distal pedal pulses are 2+ and equal bilaterally. Skin: Warm and Dry Neuro: Alert and oriented X 3. CN III-XII intact Grossly normal sensory and motor function . Psych:  Responds to questions appropriately with a normal affect.      Labs: Cardiac Enzymes No results for input(s): CKTOTAL, CKMB, TROPONINI in the last 72 hours. CBC Lab Results  Component Value Date   WBC 7.3 01/22/2019   HGB 12.2 01/22/2019   HCT 36.6 01/22/2019   MCV 85.8 01/22/2019   PLT 247.0 01/22/2019   PROTIME: No results for input(s): LABPROT, INR in the last 72 hours. Chemistry  Recent Labs  Lab 01/22/19  0759  NA 139  K 4.6  CL 104  CO2 26  BUN 18  CREATININE 0.98  CALCIUM 9.6  PROT 7.1  BILITOT 0.3  ALKPHOS 53  ALT 13  AST 13  GLUCOSE 172*   Lipids Lab Results  Component Value Date   CHOL 153 01/22/2019   HDL 56.60 01/22/2019   TRIG 285.0 (H) 01/22/2019   BNP No results  found for: PROBNP Thyroid Function Tests: No results for input(s): TSH, T4TOTAL, T3FREE, THYROIDAB in the last 72 hours.  Invalid input(s): FREET3 Miscellaneous No results found for: DDIMER  Radiology/Studies:  No results found.  EKG: Sinus rhythm at 91 Interval 16/10/37   Assessment and Plan:  ICD-Saint Jude-primary prevention  Ischemic heart disease-prior stenting/CABG  Congestive heart failure-chronic-systolic class II  Orthostatic lightheadedness     Patient's device was functioning normally.  It was reprogrammed to offer more ATP in the VT zone as well as with heart rate between 200--240.  With her cardiomyopathy, not withstanding her mild orthostasis, will add spironolactone for further reduction in cardiovascular risk.  We have reviewed side effects including hyperkalemia.  We will check a metabolic profile in 2 weeks time  She is continuing to struggle with the complexities of the persistent nature of illness.      Sherryl Manges

## 2019-01-28 NOTE — Patient Instructions (Signed)
Medication Instructions:  - Your physician has recommended you make the following change in your medication:   1) Decrease lisinopril 5 mg- take 1/2 tablet (2.5 mg) by mouth once daily at night  2) Start aldactone (spironolactone) 25 mg- take 1/2 tablet (12.5 mg) by mouth once daily in the morning  *If you need a refill on your cardiac medications before your next appointment, please call your pharmacy*  Lab Work: - Your physician recommends that you return for lab work in: 1 week (10/30)- BMP (we will get this in the office the day of your echo)  If you have labs (blood work) drawn today and your tests are completely normal, you will receive your results only by: Marland Kitchen MyChart Message (if you have MyChart) OR . A paper copy in the mail If you have any lab test that is abnormal or we need to change your treatment, we will call you to review the results.  Testing/Procedures: - none ordered  Follow-Up: At [facility], you and your health needs are our priority.  As part of our continuing mission to provide you with exceptional heart care, we have created designated Provider Care Teams.  These Care Teams include your primary Cardiologist (physician) and Advanced Practice Providers (APPs -  Physician Assistants and Nurse Practitioners) who all work together to provide you with the care you need, when you need it.  Your next appointment:   9 months (July 2021)  The format for your next appointment:   In Person  Provider:   Virl Axe, MD  Other Instructions - N/A

## 2019-02-06 ENCOUNTER — Other Ambulatory Visit: Payer: Self-pay | Admitting: Cardiology

## 2019-02-06 DIAGNOSIS — I509 Heart failure, unspecified: Secondary | ICD-10-CM

## 2019-02-06 DIAGNOSIS — I2510 Atherosclerotic heart disease of native coronary artery without angina pectoris: Secondary | ICD-10-CM

## 2019-02-07 ENCOUNTER — Other Ambulatory Visit (INDEPENDENT_AMBULATORY_CARE_PROVIDER_SITE_OTHER): Payer: 59

## 2019-02-07 ENCOUNTER — Other Ambulatory Visit: Payer: Self-pay

## 2019-02-07 ENCOUNTER — Ambulatory Visit (INDEPENDENT_AMBULATORY_CARE_PROVIDER_SITE_OTHER): Payer: 59

## 2019-02-07 DIAGNOSIS — I429 Cardiomyopathy, unspecified: Secondary | ICD-10-CM

## 2019-02-07 DIAGNOSIS — I2510 Atherosclerotic heart disease of native coronary artery without angina pectoris: Secondary | ICD-10-CM | POA: Diagnosis not present

## 2019-02-07 DIAGNOSIS — Z79899 Other long term (current) drug therapy: Secondary | ICD-10-CM

## 2019-02-07 DIAGNOSIS — I509 Heart failure, unspecified: Secondary | ICD-10-CM

## 2019-02-07 MED ORDER — PERFLUTREN LIPID MICROSPHERE
1.0000 mL | INTRAVENOUS | Status: AC | PRN
  Administered 2019-02-07 (×2): 2 mL via INTRAVENOUS

## 2019-02-08 LAB — BASIC METABOLIC PANEL
BUN/Creatinine Ratio: 24 (ref 12–28)
BUN: 25 mg/dL (ref 8–27)
CO2: 21 mmol/L (ref 20–29)
Calcium: 9.9 mg/dL (ref 8.7–10.3)
Chloride: 102 mmol/L (ref 96–106)
Creatinine, Ser: 1.06 mg/dL — ABNORMAL HIGH (ref 0.57–1.00)
GFR calc Af Amer: 65 mL/min/{1.73_m2} (ref >59)
GFR calc non Af Amer: 56 mL/min/{1.73_m2} — ABNORMAL LOW (ref >59)
Glucose: 168 mg/dL — ABNORMAL HIGH (ref 65–99)
Potassium: 4.9 mmol/L (ref 3.5–5.2)
Sodium: 138 mmol/L (ref 134–144)

## 2019-02-13 ENCOUNTER — Telehealth: Payer: Self-pay | Admitting: *Deleted

## 2019-02-13 NOTE — Telephone Encounter (Signed)
[facility]OV[facility] (HIPAA-compliant) requesting call back to [place]. Direct number given. Will request patient's release in [employer].

## 2019-02-17 NOTE — Telephone Encounter (Signed)
[employer] health called stating that they tried to call the pt numerous times to get permission to release the pt. I told them I will call the pt to ask her to call them so they can release her.  I spoke with the pt and she states she will call them today to be released to [place].

## 2019-02-18 NOTE — Telephone Encounter (Signed)
Pt is now in our clinic in [facility]

## 2019-02-28 LAB — HM DIABETES EYE EXAM

## 2019-03-10 ENCOUNTER — Ambulatory Visit (INDEPENDENT_AMBULATORY_CARE_PROVIDER_SITE_OTHER): Payer: 59 | Admitting: Cardiology

## 2019-03-10 ENCOUNTER — Telehealth: Payer: Self-pay | Admitting: Cardiology

## 2019-03-10 ENCOUNTER — Other Ambulatory Visit: Payer: Self-pay

## 2019-03-10 ENCOUNTER — Encounter: Payer: Self-pay | Admitting: Cardiology

## 2019-03-10 VITALS — BP 110/70 | HR 85 | Temp 97.0°F | Ht 63.0 in | Wt 185.2 lb

## 2019-03-10 DIAGNOSIS — I2510 Atherosclerotic heart disease of native coronary artery without angina pectoris: Secondary | ICD-10-CM

## 2019-03-10 DIAGNOSIS — I5020 Unspecified systolic (congestive) heart failure: Secondary | ICD-10-CM | POA: Diagnosis not present

## 2019-03-10 MED ORDER — ENTRESTO 24-26 MG PO TABS: 1.0000 | ORAL_TABLET | Freq: Two times a day (BID) | ORAL | 5 refills | Status: DC

## 2019-03-10 NOTE — Telephone Encounter (Signed)
Patient states when she went to pharmacy to pick up [facility], she was told it needed a PA

## 2019-03-10 NOTE — Patient Instructions (Signed)
Medication Instructions:  Your physician has recommended you make the following change in your medication:   1) STOP Lisinopril  2) On Wed 03/12/19 START Entresto 24/26mg  twice daily. An Rx has been sent to your pharmacy.  *If you need a refill on your cardiac medications before your next appointment, please call your pharmacy*  Lab Work: None ordered If you have labs (blood work) drawn today and your tests are completely normal, you will receive your results only by: Marland Kitchen MyChart Message (if you have MyChart) OR . A paper copy in the mail If you have any lab test that is abnormal or we need to change your treatment, we will call you to review the results.  Testing/Procedures: None ordered  Follow-Up: At [facility], you and your health needs are our priority.  As part of our continuing mission to provide you with exceptional heart care, we have created designated Provider Care Teams.  These Care Teams include your primary Cardiologist (physician) and Advanced Practice Providers (APPs -  Physician Assistants and Nurse Practitioners) who all work together to provide you with the care you need, when you need it.  Your next appointment:   3-4 week(s)  The format for your next appointment:   In Person  Provider:    You may see Kate Sable, MD or one of the following Advanced Practice Providers on your designated Care Team:    Murray Hodgkins, NP  Christell Faith, PA-C  Marrianne Mood, PA-C   Other Instructions N/A

## 2019-03-10 NOTE — Progress Notes (Signed)
Cardiology Office Note:    Date:  03/10/2019   ID:  Monique Franklin, DOB 1955-12-09, MRN [id number]  PCP:  Lorre Munroe, NP  Cardiologist:  Debbe Odea, MD  Electrophysiologist:  None   Referring MD: Lorre Munroe, NP   Chief Complaint  Patient presents with  . office visit    F/U after EP eval and echo; Meds verbally reviewed with patient.    History of Present Illness:    Monique Franklin is a 63 y.o. female with a hx of hypertension, hyperlipidemia, CAD, PCI to LAD and LCx(5 stents total), status post CABG x2 (LIMA to LAD, SVG to OM 2019), HFrEF EF 31%, s/p ICD 2020(St. Jude's device), PAD(left femoral thrombo-endarterectomy with patch angioplasty 12/2017) who presents for follow-up.   Patient was originally seen to establish care.  She used to be followed at [facility] and recently moved into the area.  After CABG in 2019, she had a left femoral artery complication requiring left femoral patch angioplasty.  Earlier in 2020, she had an ICD placed due to low EF with ejection fraction of 31%.  She is a former smoker for about 20 years.  She otherwise feels great, is able to walk about 2 miles with intermittent breaks.  She denies edema, takes her Lasix every other day.  Recently started on spironolactone 12.5 mg daily.  After last visit, an echocardiogram was ordered.  Past Medical History:  Diagnosis Date  . Cardiomyopathy (HCC)   . CHF (congestive heart failure) (HCC)    Class I  . Coronary artery disease   . Diabetes mellitus without complication (HCC)   . GERD (gastroesophageal reflux disease)   . HFrEF (heart failure with reduced ejection fraction) (HCC)   . Hyperlipidemia   . Hypertension   . MI (myocardial infarction) (HCC)   . PAD (peripheral artery disease) (HCC)     Past Surgical History:  Procedure Laterality Date  . BREAST BIOPSY    . CARDIAC CATHETERIZATION    . CARDIAC DEFIBRILLATOR PLACEMENT    . COMBINED AUGMENTATION MAMMAPLASTY AND  ABDOMINOPLASTY    . CORONARY ARTERY BYPASS GRAFT  08/2017   X2 with LIMA to LAD; 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;Severe 3 vessel CAD-PCI to LAD and LC x(5stents) Coronary dissection of LM to LAD   . CORONARY ARTERY BYPASS GRAFT    . CORONARY STENT PLACEMENT    . PERCUTANEOUS CORONARY STENT INTERVENTION (PCI-S)    . TONSILLECTOMY      Current Medications: Current Meds  Medication Sig  . aspirin EC 81 MG tablet Take 81 mg by mouth daily.  . carvedilol (COREG) 6.25 MG tablet Take 6.25 mg by mouth 2 (two) times daily with a meal.  . dapagliflozin propanediol (FARXIGA) 10 MG TABS tablet Take 10 mg by mouth daily.  . fenofibrate 54 MG tablet Take 1 tablet (54 mg total) by mouth daily.  . furosemide (LASIX) 40 MG tablet Take 40 mg by mouth every other day.  Marland Kitchen glimepiride (AMARYL) 2 MG tablet Take 2 mg by mouth daily with breakfast.  . metFORMIN (GLUCOPHAGE) 1000 MG tablet Take 1,000 mg by mouth 2 (two) times daily with a meal.  . rosuvastatin (CRESTOR) 40 MG tablet Take 40 mg by mouth daily.  Marland Kitchen spironolactone (ALDACTONE) 25 MG tablet Take 0.5 tablets (12.5 mg total) by mouth daily.  . [DISCONTINUED] lisinopril (ZESTRIL) 5 MG tablet Take 1/2 tablet (2.5 mg) by mouth once daily     Allergies:   Brilinta [ticagrelor]   Social  History   Socioeconomic History  . Marital status: Widowed    Spouse name: Not on file  . Number of children: Not on file  . Years of education: Not on file  . Highest education level: Not on file  Occupational History  . Not on file  Social Needs  . Financial resource strain: Not on file  . Food insecurity    Worry: Not on file    Inability: Not on file  . Transportation needs    Medical: Not on file    Non-medical: Not on file  Tobacco Use  . Smoking status: Former Smoker    Packs/day: 1.00    Years: 33.00    Pack years: 33.00  . Smokeless tobacco: Never Used  Substance and Sexual Activity  . Alcohol use: Yes    Comment: occasional  . Drug use: Never  .  Sexual activity: Not on file  Lifestyle  . Physical activity    Days per week: Not on file    Minutes per session: Not on file  . Stress: Not on file  Relationships  . Social Musician on phone: Not on file    Gets together: Not on file    Attends religious service: Not on file    Active member of club or organization: Not on file    Attends meetings of clubs or organizations: Not on file    Relationship status: Not on file  Other Topics Concern  . Not on file  Social History Narrative  . Not on file     Family History: The patient's family history includes Breast cancer in her sister; COPD in her father; Diabetes in her brother and sister; Heart attack in her mother; Thyroid cancer in her sister.  ROS:   Please see the history of present illness.     All other systems reviewed and are negative.  EKGs/Labs/Other Studies Reviewed:    The following studies were reviewed today: TTE 2019-02-19 1. Left ventricular ejection fraction, by visual estimation, is 30 to 35%. The left ventricle has severely decreased function. There is no left ventricular hypertrophy.  2. Left ventricular diastolic parameters are consistent with Grade I diastolic dysfunction (impaired relaxation).  3. Mildly dilated left ventricular internal cavity size.  4. The left ventricle demonstrates global hypokinesis.  5. Global right ventricle has normal systolic function.The right ventricular size is normal. No increase in right ventricular wall thickness.  6. Left atrial size was normal.  7. Mild to moderate mitral valve regurgitation.  8. TR signal is inadequate for assessing pulmonary artery systolic pressure.  9. A pacer wire is visualized in the RV.  EKG:  EKG is  ordered today.  The ekg ordered today demonstrates normal sinus rhythm old inferior infarct.  .  Recent Labs: 01/22/2019: ALT 13; Hemoglobin 12.2; Platelets 247.0 02/19/2019: BUN 25; Creatinine, Ser 1.06; Potassium 4.9; Sodium 138   Recent Lipid Panel    Component Value Date/Time   CHOL 153 01/22/2019 0759   TRIG 285.0 (H) 01/22/2019 0759   HDL 56.60 01/22/2019 0759   CHOLHDL 3 01/22/2019 0759   VLDL 57.0 (H) 01/22/2019 0759   LDLDIRECT 68.0 01/22/2019 0759    Physical Exam:    VS:  BP 110/70 (BP Location: Left Arm, Patient Position: Sitting, Cuff Size: Normal)   Pulse 85   Temp (!) 97 F (36.1 C)   Ht 5\' 3"  (1.6 m)   Wt 185 lb 4 oz (84 kg)   SpO2  99%   BMI 32.82 kg/m     Wt Readings from Last 3 Encounters:  03/10/19 185 lb 4 oz (84 kg)  01/28/19 184 lb 8 oz (83.7 kg)  01/03/19 181 lb (82.1 kg)     GEN:  Well nourished, well developed in no acute distress HEENT: Normal NECK: No JVD; No carotid bruits LYMPHATICS: No lymphadenopathy CARDIAC: RRR, no murmurs, rubs, gallops RESPIRATORY:  Clear to auscultation without rales, wheezing or rhonchi  ABDOMEN: Soft, non-tender, non-distended MUSCULOSKELETAL:  No edema; No deformity  SKIN: Warm and dry NEUROLOGIC:  Alert and oriented x 3 PSYCHIATRIC:  Normal affect   ASSESSMENT:   Patient appears euvolemic with no anginal symptoms.  Echocardiogram shows EF of 30 to 35%, similar to prior. 1. Heart failure with reduced ejection fraction ([place])   2. Coronary artery disease involving native coronary artery of native heart without angina pectoris    PLAN:    In order of problems listed above:  1. Stop lisinopril, start Entresto 24/26 mg twice daily in 2 days.  Plan to titrate Entresto as blood pressure tolerates every 3 to 4 weeks.  Continue Coreg 6.25 twice daily, spironolactone 12.5 mg daily, Lasix 40 every other day. 2. Continue aspirin 81 mg, Crestor 40 mg daily.  Follow-up in 3 to 4 weeks.   Medication Adjustments/Labs and Tests Ordered: Current medicines are reviewed at length with the patient today.  Concerns regarding medicines are outlined above.  Orders Placed This Encounter  Procedures  . EKG 12-Lead   Meds ordered this encounter   Medications  . sacubitril-valsartan (ENTRESTO) 24-26 MG    Sig: Take 1 tablet by mouth 2 (two) times daily.    Dispense:  60 tablet    Refill:  5    Patient Instructions  Medication Instructions:  Your physician has recommended you make the following change in your medication:   1) STOP Lisinopril  2) On Wed 03/12/19 START Entresto 24/26mg  twice daily. An Rx has been sent to your pharmacy.  *If you need a refill on your cardiac medications before your next appointment, please call your pharmacy*  Lab Work: None ordered If you have labs (blood work) drawn today and your tests are completely normal, you will receive your results only by: Marland Kitchen MyChart Message (if you have MyChart) OR . A paper copy in the mail If you have any lab test that is abnormal or we need to change your treatment, we will call you to review the results.  Testing/Procedures: None ordered  Follow-Up: At [facility], you and your health needs are our priority.  As part of our continuing mission to provide you with exceptional heart care, we have created designated Provider Care Teams.  These Care Teams include your primary Cardiologist (physician) and Advanced Practice Providers (APPs -  Physician Assistants and Nurse Practitioners) who all work together to provide you with the care you need, when you need it.  Your next appointment:   3-4 week(s)  The format for your next appointment:   In Person  Provider:    You may see Kate Sable, MD or one of the following Advanced Practice Providers on your designated Care Team:    Murray Hodgkins, NP  Christell Faith, PA-C  Marrianne Mood, PA-C   Other Instructions N/A     Signed, Kate Sable, MD  03/10/2019 8:35 AM    [place]

## 2019-03-11 ENCOUNTER — Telehealth: Payer: Self-pay

## 2019-03-11 NOTE — Telephone Encounter (Signed)
PA started through [employer] for [facility] [address] (Key: [place]) Rx #: [id number] Entresto 24-26MG  tablets   Your PA request has been approved.  Approval dates : 03/11/2019 through 03/10/2022

## 2019-03-11 NOTE — Telephone Encounter (Signed)
Spoke to patient.  Made her aware that PA for Delene Loll was approved.

## 2019-03-11 NOTE — Telephone Encounter (Signed)
Patient calling to check on status.

## 2019-04-07 ENCOUNTER — Encounter: Payer: Self-pay | Admitting: Cardiology

## 2019-04-07 ENCOUNTER — Ambulatory Visit (INDEPENDENT_AMBULATORY_CARE_PROVIDER_SITE_OTHER): Payer: 59 | Admitting: Cardiology

## 2019-04-07 ENCOUNTER — Other Ambulatory Visit: Payer: Self-pay

## 2019-04-07 VITALS — BP 98/72 | HR 77 | Ht 63.0 in | Wt 187.0 lb

## 2019-04-07 DIAGNOSIS — I2510 Atherosclerotic heart disease of native coronary artery without angina pectoris: Secondary | ICD-10-CM | POA: Diagnosis not present

## 2019-04-07 DIAGNOSIS — I5020 Unspecified systolic (congestive) heart failure: Secondary | ICD-10-CM | POA: Diagnosis not present

## 2019-04-07 DIAGNOSIS — Z95810 Presence of automatic (implantable) cardiac defibrillator: Secondary | ICD-10-CM | POA: Diagnosis not present

## 2019-04-07 NOTE — Patient Instructions (Signed)
Medication Instructions:  Your physician recommends that you continue on your current medications as directed. Please refer to the Current Medication list given to you today.  *If you need a refill on your cardiac medications before your next appointment, please call your pharmacy*  Lab Work: None ordered If you have labs (blood work) drawn today and your tests are completely normal, you will receive your results only by: Marland Kitchen MyChart Message (if you have MyChart) OR . A paper copy in the mail If you have any lab test that is abnormal or we need to change your treatment, we will call you to review the results.  Testing/Procedures: None ordered  Follow-Up: At [facility], you and your health needs are our priority.  As part of our continuing mission to provide you with exceptional heart care, we have created designated Provider Care Teams.  These Care Teams include your primary Cardiologist (physician) and Advanced Practice Providers (APPs -  Physician Assistants and Nurse Practitioners) who all work together to provide you with the care you need, when you need it.  Your next appointment:   3 month(s)  The format for your next appointment:   In Person  Provider:    You may see Kate Sable, MD or one of the following Advanced Practice Providers on your designated Care Team:    Murray Hodgkins, NP  Christell Faith, PA-C  Marrianne Mood, PA-C   Other Instructions You have been referred to CHF clinic in [place]. They will contact you directly to schedule.

## 2019-04-07 NOTE — Progress Notes (Signed)
Cardiology Office Note:    Date:  04/07/2019   ID:  Monique Franklin, DOB 1956/01/15, MRN [id number]  PCP:  Jearld Fenton, NP  Cardiologist:  Kate Sable, MD  Electrophysiologist:  None   Referring MD: Jearld Fenton, NP   Chief Complaint  Patient presents with  . other    3-4 week F/U no complaints today. Meds reviewed verbally.     History of Present Illness:    Monique Franklin is a 63 y.o. female with a hx of hypertension, hyperlipidemia, CAD, PCI to LAD and LCx(5 stents total), status post CABG x2 (LIMA to LAD, SVG to OM 2019), HFrEF EF 31%, s/p ICD 2020(St. Jude's device), PAD(left femoral thrombo-endarterectomy with patch angioplasty 12/2017) who presents for follow-up.   Patient was originally seen to establish care.  She used to be followed at [facility] and recently moved into the area.  After CABG in 2019, she had a left femoral artery complication requiring left femoral patch angioplasty.  Earlier in 2020, she had an ICD placed due to low EF with ejection fraction of 31%.  She is a former smoker for about 20 years.  She otherwise feels great, is able to walk about 2 miles with intermittent breaks.  She denies edema, takes her Lasix every other day.  Recently started on spironolactone 12.5 mg daily.  Entresto 24-26 mg twice daily was started after last visit.   Past Medical History:  Diagnosis Date  . Cardiomyopathy ([place])   . CHF (congestive heart failure) (HCC)    Class I  . Coronary artery disease   . Diabetes mellitus without complication ([place])   . GERD (gastroesophageal reflux disease)   . HFrEF (heart failure with reduced ejection fraction) ([place])   . Hyperlipidemia   . Hypertension   . MI (myocardial infarction) ([place])   . PAD (peripheral artery disease) ([place])     Past Surgical History:  Procedure Laterality Date  . BREAST BIOPSY    . CARDIAC CATHETERIZATION    . CARDIAC DEFIBRILLATOR PLACEMENT    . COMBINED AUGMENTATION MAMMAPLASTY AND  ABDOMINOPLASTY    . CORONARY ARTERY BYPASS GRAFT  08/2017   X2 with LIMA to LAD; 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;Severe 3 vessel CAD-PCI to LAD and LC x(5stents) Coronary dissection of LM to LAD   . CORONARY ARTERY BYPASS GRAFT    . CORONARY STENT PLACEMENT    . PERCUTANEOUS CORONARY STENT INTERVENTION (PCI-S)    . TONSILLECTOMY      Current Medications: Current Meds  Medication Sig  . aspirin EC 81 MG tablet Take 81 mg by mouth daily.  . carvedilol (COREG) 6.25 MG tablet Take 6.25 mg by mouth 2 (two) times daily with a meal.  . dapagliflozin propanediol (FARXIGA) 10 MG TABS tablet Take 10 mg by mouth daily.  . fenofibrate 54 MG tablet Take 1 tablet (54 mg total) by mouth daily.  . furosemide (LASIX) 40 MG tablet Take 40 mg by mouth every other day.  Marland Kitchen glimepiride (AMARYL) 2 MG tablet Take 2 mg by mouth daily with breakfast.  . metFORMIN (GLUCOPHAGE) 1000 MG tablet Take 1,000 mg by mouth 2 (two) times daily with a meal.  . rosuvastatin (CRESTOR) 40 MG tablet Take 40 mg by mouth daily.  . sacubitril-valsartan (ENTRESTO) 24-26 MG Take 1 tablet by mouth 2 (two) times daily.  Marland Kitchen spironolactone (ALDACTONE) 25 MG tablet Take 0.5 tablets (12.5 mg total) by mouth daily.     Allergies:   Brilinta [ticagrelor]   Social  History   Socioeconomic History  . Marital status: Widowed    Spouse name: Not on file  . Number of children: Not on file  . Years of education: Not on file  . Highest education level: Not on file  Occupational History  . Not on file  Tobacco Use  . Smoking status: Former Smoker    Packs/day: 1.00    Years: 33.00    Pack years: 33.00  . Smokeless tobacco: Never Used  Substance and Sexual Activity  . Alcohol use: Yes    Comment: occasional  . Drug use: Never  . Sexual activity: Not on file  Other Topics Concern  . Not on file  Social History Narrative  . Not on file   Social Determinants of Health   Financial Resource Strain:   . Difficulty of Paying Living  Expenses: Not on file  Food Insecurity:   . Worried About RunninProgramme researcher, broadcasting/film/videog Out of Food in the Last Year: Not on file  . Ran Out of Food in the Last Year: Not on file  Transportation Needs:   . Lack of Transportation (Medical): Not on file  . Lack of Transportation (Non-Medical): Not on file  Physical Activity:   . Days of Exercise per Week: Not on file  . Minutes of Exercise per Session: Not on file  Stress:   . Feeling of Stress : Not on file  Social Connections:   . Frequency of Communication with Friends and Family: Not on file  . Frequency of Social Gatherings with Friends and Family: Not on file  . Attends Religious Services: Not on file  . Active Member of Clubs or Organizations: Not on file  . Attends BankerClub or Organization Meetings: Not on file  . Marital Status: Not on file     Family History: The patient's family history includes Breast cancer in her sister; COPD in her father; Diabetes in her brother and sister; Heart attack in her mother; Thyroid cancer in her sister.  ROS:   Please see the history of present illness.     All other systems reviewed and are negative.  EKGs/Labs/Other Studies Reviewed:    The following studies were reviewed today: TTE 02/07/2019 1. Left ventricular ejection fraction, by visual estimation, is 30 to 35%. The left ventricle has severely decreased function. There is no left ventricular hypertrophy.  2. Left ventricular diastolic parameters are consistent with Grade I diastolic dysfunction (impaired relaxation).  3. Mildly dilated left ventricular internal cavity size.  4. The left ventricle demonstrates global hypokinesis.  5. Global right ventricle has normal systolic function.The right ventricular size is normal. No increase in right ventricular wall thickness.  6. Left atrial size was normal.  7. Mild to moderate mitral valve regurgitation.  8. TR signal is inadequate for assessing pulmonary artery systolic pressure.  9. A pacer wire is  visualized in the RV.  EKG:  EKG is  ordered today.  The ekg ordered today demonstrates normal sinus rhythm, PACs.  Recent Labs: 01/22/2019: ALT 13; Hemoglobin 12.2; Platelets 247.0 02/07/2019: BUN 25; Creatinine, Ser 1.06; Potassium 4.9; Sodium 138  Recent Lipid Panel    Component Value Date/Time   CHOL 153 01/22/2019 0759   TRIG 285.0 (H) 01/22/2019 0759   HDL 56.60 01/22/2019 0759   CHOLHDL 3 01/22/2019 0759   VLDL 57.0 (H) 01/22/2019 0759   LDLDIRECT 68.0 01/22/2019 0759    Physical Exam:    VS:  BP 98/72 (BP Location: Left Arm, Patient Position: Sitting, Cuff Size:  Normal)   Pulse 77   Ht 5\' 3"  (1.6 m)   Wt 187 lb (84.8 kg)   SpO2 98%   BMI 33.13 kg/m     Wt Readings from Last 3 Encounters:  04/07/19 187 lb (84.8 kg)  03/10/19 185 lb 4 oz (84 kg)  01/28/19 184 lb 8 oz (83.7 kg)     GEN:  Well nourished, well developed in no acute distress HEENT: Normal NECK: No JVD; No carotid bruits LYMPHATICS: No lymphadenopathy CARDIAC: RRR, no murmurs, rubs, gallops RESPIRATORY:  Clear to auscultation without rales, wheezing or rhonchi  ABDOMEN: Soft, non-tender, non-distended MUSCULOSKELETAL:  No edema; No deformity  SKIN: Warm and dry NEUROLOGIC:  Alert and oriented x 3 PSYCHIATRIC:  Normal affect   ASSESSMENT:   Patient appears euvolemic with no anginal symptoms.  Echocardiogram shows EF of 30 to 35%, similar to prior.  Blood pressure today is low normal.  Will avoid titrating up Entresto for now. 1. Heart failure with reduced ejection fraction (HCC)   2. Coronary artery disease involving native coronary artery of native heart without angina pectoris   3. ICD (implantable cardioverter-defibrillator) in place    PLAN:    In order of problems listed above:  1. Pressure is low normal.  Continue Entresto 24-26 mg twice daily.  Plan to titrate Entresto as blood pressure tolerates .  Continue Coreg 6.25 twice daily, spironolactone 12.5 mg daily, Lasix 40 every other day.   Plan for patient to see heart failure clinic in [place] for any additional input. 2. Continue aspirin 81 mg, Crestor 40 mg daily. 3. Keep device checks appointment with EP.  Follow-up in 3 months   Medication Adjustments/Labs and Tests Ordered: Current medicines are reviewed at length with the patient today.  Concerns regarding medicines are outlined above.  Orders Placed This Encounter  Procedures  . AMB referral to CHF clinic  . EKG 12-Lead   No orders of the defined types were placed in this encounter.   Patient Instructions  Medication Instructions:  Your physician recommends that you continue on your current medications as directed. Please refer to the Current Medication list given to you today.  *If you need a refill on your cardiac medications before your next appointment, please call your pharmacy*  Lab Work: None ordered If you have labs (blood work) drawn today and your tests are completely normal, you will receive your results only by: Marland Kitchen MyChart Message (if you have MyChart) OR . A paper copy in the mail If you have any lab test that is abnormal or we need to change your treatment, we will call you to review the results.  Testing/Procedures: None ordered  Follow-Up: At [facility], you and your health needs are our priority.  As part of our continuing mission to provide you with exceptional heart care, we have created designated Provider Care Teams.  These Care Teams include your primary Cardiologist (physician) and Advanced Practice Providers (APPs -  Physician Assistants and Nurse Practitioners) who all work together to provide you with the care you need, when you need it.  Your next appointment:   3 month(s)  The format for your next appointment:   In Person  Provider:    You may see Debbe Odea, MD or one of the following Advanced Practice Providers on your designated Care Team:    Nicolasa Ducking, NP  Eula Listen, PA-C  Marisue Ivan,  PA-C   Other Instructions You have been referred to CHF clinic in  [place]. They will contact you directly to schedule.       Signed, Debbe Odea, MD  04/07/2019 12:40 PM     [facility]

## 2019-04-09 ENCOUNTER — Encounter: Payer: Self-pay | Admitting: Internal Medicine

## 2019-04-14 MED ORDER — CARVEDILOL 6.25 MG PO TABS: 6.2500 mg | ORAL_TABLET | Freq: Two times a day (BID) | ORAL | 0 refills | Status: DC

## 2019-04-14 MED ORDER — FENOFIBRATE 54 MG PO TABS: 54.0000 mg | ORAL_TABLET | Freq: Every day | ORAL | 0 refills | Status: DC

## 2019-04-14 MED ORDER — FARXIGA 10 MG PO TABS: 10.0000 mg | ORAL_TABLET | Freq: Every day | ORAL | 0 refills | Status: DC

## 2019-04-15 MED ORDER — CARVEDILOL 6.25 MG PO TABS: 6.2500 mg | ORAL_TABLET | Freq: Two times a day (BID) | ORAL | 0 refills | Status: DC

## 2019-04-15 MED ORDER — FARXIGA 10 MG PO TABS: 10.0000 mg | ORAL_TABLET | Freq: Every day | ORAL | 0 refills | Status: DC

## 2019-04-15 MED ORDER — FENOFIBRATE 54 MG PO TABS: 54.0000 mg | ORAL_TABLET | Freq: Every day | ORAL | 0 refills | Status: DC

## 2019-04-15 NOTE — Addendum Note (Signed)
Addended by: Roena Malady on: 04/15/2019 01:59 PM   Modules accepted: Orders

## 2019-04-17 ENCOUNTER — Other Ambulatory Visit (INDEPENDENT_AMBULATORY_CARE_PROVIDER_SITE_OTHER): Payer: 59

## 2019-04-17 ENCOUNTER — Other Ambulatory Visit: Payer: Self-pay

## 2019-04-17 ENCOUNTER — Ambulatory Visit: Payer: 59 | Admitting: Internal Medicine

## 2019-04-17 DIAGNOSIS — E782 Mixed hyperlipidemia: Secondary | ICD-10-CM | POA: Diagnosis not present

## 2019-04-17 LAB — LIPID PANEL
Cholesterol: 148 mg/dL (ref 0–200)
HDL: 54.5 mg/dL (ref >39.00)
LDL Cholesterol: 54 mg/dL (ref 0–99)
NonHDL: 93.05
Total CHOL/HDL Ratio: 3
Triglycerides: 196 mg/dL — ABNORMAL HIGH (ref 0.0–149.0)
VLDL: 39.2 mg/dL (ref 0.0–40.0)

## 2019-04-17 LAB — BASIC METABOLIC PANEL
BUN: 24 mg/dL — ABNORMAL HIGH (ref 6–23)
CO2: 27 mEq/L (ref 19–32)
Calcium: 9.6 mg/dL (ref 8.4–10.5)
Chloride: 105 mEq/L (ref 96–112)
Creatinine, Ser: 1.09 mg/dL (ref 0.40–1.20)
GFR: 50.56 mL/min — ABNORMAL LOW (ref >60.00)
Glucose, Bld: 155 mg/dL — ABNORMAL HIGH (ref 70–99)
Potassium: 4.5 mEq/L (ref 3.5–5.1)
Sodium: 139 mEq/L (ref 135–145)

## 2019-04-18 ENCOUNTER — Encounter: Payer: Self-pay | Admitting: Internal Medicine

## 2019-04-22 ENCOUNTER — Encounter: Payer: Self-pay | Admitting: Internal Medicine

## 2019-04-22 ENCOUNTER — Other Ambulatory Visit: Payer: Self-pay

## 2019-04-22 ENCOUNTER — Ambulatory Visit (INDEPENDENT_AMBULATORY_CARE_PROVIDER_SITE_OTHER): Payer: 59 | Admitting: Internal Medicine

## 2019-04-22 VITALS — BP 112/74 | HR 80 | Temp 97.4°F | Wt 187.0 lb

## 2019-04-22 DIAGNOSIS — N1831 Chronic kidney disease, stage 3a: Secondary | ICD-10-CM | POA: Diagnosis not present

## 2019-04-22 DIAGNOSIS — E119 Type 2 diabetes mellitus without complications: Secondary | ICD-10-CM | POA: Diagnosis not present

## 2019-04-22 DIAGNOSIS — I509 Heart failure, unspecified: Secondary | ICD-10-CM | POA: Diagnosis not present

## 2019-04-22 NOTE — Progress Notes (Signed)
Subjective:    Patient ID: Monique Franklin, female    DOB: Sep 06, 1955, 64 y.o.   MRN: [id number]  HPI  Pt presents to the clinic today to discuss abnormal labs. Her most recent GFR was 50.56, 1/21 down from 57.21, 10/20. She is currently taking Furosemide (every other day) and Spironolactone for CHF. Echo from 01/2019 reviewed. She is also taking Metformin for diabetes, last A1C was 7%, 01/2019. Her sugars typically range around 130-160 fasting. She reports she does not take any NSAID's OTC.  Review of Systems      Past Medical History:  Diagnosis Date  . Cardiomyopathy (HCC)   . CHF (congestive heart failure) (HCC)    Class I  . Coronary artery disease   . Diabetes mellitus without complication (HCC)   . GERD (gastroesophageal reflux disease)   . HFrEF (heart failure with reduced ejection fraction) (HCC)   . Hyperlipidemia   . Hypertension   . MI (myocardial infarction) (HCC)   . PAD (peripheral artery disease) (HCC)     Current Outpatient Medications  Medication Sig Dispense Refill  . aspirin EC 81 MG tablet Take 81 mg by mouth daily.    . carvedilol (COREG) 6.25 MG tablet Take 1 tablet (6.25 mg total) by mouth 2 (two) times daily with a meal. 180 tablet 0  . dapagliflozin propanediol (FARXIGA) 10 MG TABS tablet Take 10 mg by mouth daily. 90 tablet 0  . fenofibrate 54 MG tablet Take 1 tablet (54 mg total) by mouth daily. 90 tablet 0  . furosemide (LASIX) 40 MG tablet Take 40 mg by mouth every other day.    Marland Kitchen glimepiride (AMARYL) 2 MG tablet Take 2 mg by mouth daily with breakfast.    . metFORMIN (GLUCOPHAGE) 1000 MG tablet Take 1,000 mg by mouth 2 (two) times daily with a meal.    . rosuvastatin (CRESTOR) 40 MG tablet Take 40 mg by mouth daily.    . sacubitril-valsartan (ENTRESTO) 24-26 MG Take 1 tablet by mouth 2 (two) times daily. 60 tablet 5  . spironolactone (ALDACTONE) 25 MG tablet Take 0.5 tablets (12.5 mg total) by mouth daily. 30 tablet 3   No current  facility-administered medications for this visit.    Allergies  Allergen Reactions  . Brilinta [Ticagrelor]     Family History  Problem Relation Age of Onset  . Heart attack Mother   . COPD Father   . Thyroid cancer Sister   . Breast cancer Sister   . Diabetes Sister   . Diabetes Brother     Social History   Socioeconomic History  . Marital status: Widowed    Spouse name: Not on file  . Number of children: Not on file  . Years of education: Not on file  . Highest education level: Not on file  Occupational History  . Not on file  Tobacco Use  . Smoking status: Former Smoker    Packs/day: 1.00    Years: 33.00    Pack years: 33.00  . Smokeless tobacco: Never Used  Substance and Sexual Activity  . Alcohol use: Yes    Comment: occasional  . Drug use: Never  . Sexual activity: Not on file  Other Topics Concern  . Not on file  Social History Narrative  . Not on file   Social Determinants of Health   Financial Resource Strain:   . Difficulty of Paying Living Expenses: Not on file  Food Insecurity:   . Worried About Programme researcher, broadcasting/film/video  in the Last Year: Not on file  . Ran Out of Food in the Last Year: Not on file  Transportation Needs:   . Lack of Transportation (Medical): Not on file  . Lack of Transportation (Non-Medical): Not on file  Physical Activity:   . Days of Exercise per Week: Not on file  . Minutes of Exercise per Session: Not on file  Stress:   . Feeling of Stress : Not on file  Social Connections:   . Frequency of Communication with Friends and Family: Not on file  . Frequency of Social Gatherings with Friends and Family: Not on file  . Attends Religious Services: Not on file  . Active Member of Clubs or Organizations: Not on file  . Attends Archivist Meetings: Not on file  . Marital Status: Not on file  Intimate Partner Violence:   . Fear of Current or Ex-Partner: Not on file  . Emotionally Abused: Not on file  . Physically Abused:  Not on file  . Sexually Abused: Not on file     Constitutional: Denies fever, malaise, fatigue, headache or abrupt weight changes.  Respiratory: Denies difficulty breathing, shortness of breath, cough or sputum production.   Cardiovascular: Denies chest pain, chest tightness, palpitations or swelling in the hands or feet.  Neurological: Denies dizziness, difficulty with memory, difficulty with speech or problems with balance and coordination.    No other specific complaints in a complete review of systems (except as listed in HPI above).  Objective:   Physical Exam  BP 112/74   Pulse 80   Temp (!) 97.4 F (36.3 C) (Temporal)   Wt 187 lb (84.8 kg)   SpO2 99%   BMI 33.13 kg/m   Wt Readings from Last 3 Encounters:  04/07/19 187 lb (84.8 kg)  03/10/19 185 lb 4 oz (84 kg)  01/28/19 184 lb 8 oz (83.7 kg)    General: Appears her stated age, obese, in NAD. Skin: Warm, dry and intact. No ulcerations noted. Cardiovascular: Normal rate and rhythm. S1,S2 noted.  No murmur, rubs or gallops noted. No JVD or BLE edema.  Pulmonary/Chest: Normal effort and positive vesicular breath sounds. No respiratory distress. No wheezes, rales or ronchi noted.  Neurological: Alert and oriented.    BMET    Component Value Date/Time   NA 139 04/17/2019 0842   NA 138 02/07/2019 0753   K 4.5 04/17/2019 0842   CL 105 04/17/2019 0842   CO2 27 04/17/2019 0842   GLUCOSE 155 (H) 04/17/2019 0842   BUN 24 (H) 04/17/2019 0842   BUN 25 02/07/2019 0753   CREATININE 1.09 04/17/2019 0842   CALCIUM 9.6 04/17/2019 0842   GFRNONAA 56 (L) 02/07/2019 0753   GFRAA 65 02/07/2019 0753    Lipid Panel     Component Value Date/Time   CHOL 148 04/17/2019 0842   TRIG 196.0 (H) 04/17/2019 0842   HDL 54.50 04/17/2019 0842   CHOLHDL 3 04/17/2019 0842   VLDL 39.2 04/17/2019 0842   LDLCALC 54 04/17/2019 0842    CBC    Component Value Date/Time   WBC 7.3 01/22/2019 0759   RBC 4.26 01/22/2019 0759   HGB 12.2  01/22/2019 0759   HCT 36.6 01/22/2019 0759   PLT 247.0 01/22/2019 0759   MCV 85.8 01/22/2019 0759   MCHC 33.4 01/22/2019 0759   RDW 15.4 01/22/2019 0759    Hgb A1C Lab Results  Component Value Date   HGBA1C 7.0 (H) 01/22/2019  Assessment & Plan:   CKD 3:  Will decrease Metformin 500 mg BID Continue Glimeperide and Farxiga Monitor fasting blood sugars, if > 150 consistently, let me know so we can adjust other medications. Encouraged her to consume a low carb diet and exercise for weight loss On ARB for renal protection  CHF:  Compensated Continue Furosemide and Spironolactone as scheduled If kidney function does not improve with weaning down on Metformin, consider weaning Furosemide  RTC in 3 months for lab only BMET and A1C  Nicki Reaper, NP This visit occurred during the SARS-CoV-2 public health emergency.  Safety protocols were in place, including screening questions prior to the visit, additional usage of staff PPE, and extensive cleaning of exam room while observing appropriate contact time as indicated for disinfecting solutions.

## 2019-04-22 NOTE — Patient Instructions (Signed)
Chronic Kidney Disease, Adult Chronic kidney disease (CKD) happens when the kidneys are damaged over a long period of time. The kidneys are two organs that help with:  Getting rid of waste and extra fluid from the blood.  Making hormones that maintain the amount of fluid in your tissues and blood vessels.  Making sure that the body has the right amount of fluids and chemicals. Most of the time, CKD does not go away, but it can usually be controlled. Steps must be taken to slow down the kidney damage or to stop it from getting worse. If this is not done, the kidneys may stop working. Follow these instructions at home: Medicines  Take over-the-counter and prescription medicines only as told by your doctor. You may need to change the amount of medicines you take.  Do not take any new medicines unless your doctor says it is okay. Many medicines can make your kidney damage worse.  Do not take any vitamin and supplements unless your doctor says it is okay. Many vitamins and supplements can make your kidney damage worse. General instructions  Follow a diet as told by your doctor. You may need to stay away from: ? Alcohol. ? Salty foods. ? Foods that are high in:  Potassium.  Calcium.  Protein.  Do not use any products that contain nicotine or tobacco, such as cigarettes and e-cigarettes. If you need help quitting, ask your doctor.  Keep track of your blood pressure at home. Tell your doctor about any changes.  If you have diabetes, keep track of your blood sugar as told by your doctor.  Try to stay at a healthy weight. If you need help, ask your doctor.  Exercise at least 30 minutes a day, 5 days a week.  Stay up-to-date with your shots (immunizations) as told by your doctor.  Keep all follow-up visits as told by your doctor. This is important. Contact a doctor if:  Your symptoms get worse.  You have new symptoms. Get help right away if:  You have symptoms of end-stage  kidney disease. These may include: ? Headaches. ? Numbness in your hands or feet. ? Easy bruising. ? Having hiccups often. ? Chest pain. ? Shortness of breath. ? Stopping of menstrual periods in women.  You have a fever.  You have very little pee (urine).  You have pain or bleeding when you pee. Summary  Chronic kidney disease (CKD) happens when the kidneys are damaged over a long period of time.  Most of the time, this condition does not go away, but it can usually be controlled. Steps must be taken to slow down the kidney damage or to stop it from getting worse.  Treatment may include a combination of medicines and lifestyle changes. This information is not intended to replace advice given to you by your health care provider. Make sure you discuss any questions you have with your health care provider. Document Revised: 03/09/2017 Document Reviewed: 05/01/2016 Elsevier Patient Education  2020 Elsevier Inc.  

## 2019-04-29 ENCOUNTER — Ambulatory Visit (INDEPENDENT_AMBULATORY_CARE_PROVIDER_SITE_OTHER): Payer: 59 | Admitting: *Deleted

## 2019-04-29 DIAGNOSIS — I429 Cardiomyopathy, unspecified: Secondary | ICD-10-CM

## 2019-04-29 LAB — CUP PACEART REMOTE DEVICE CHECK
Battery Remaining Longevity: 89 mo
Battery Remaining Percentage: 87 %
Battery Voltage: 3.05 V
Brady Statistic RV Percent Paced: 1 %
Date Time Interrogation Session: 20210119020016
HighPow Impedance: 77 Ohm
HighPow Impedance: 77 Ohm
Implantable Lead Implant Date: 20200707
Implantable Lead Location: 753860
Implantable Pulse Generator Implant Date: 20200707
Lead Channel Impedance Value: 580 Ohm
Lead Channel Pacing Threshold Amplitude: 0.5 V
Lead Channel Pacing Threshold Pulse Width: 0.5 ms
Lead Channel Sensing Intrinsic Amplitude: 11.7 mV
Lead Channel Setting Pacing Amplitude: 2.5 V
Lead Channel Setting Pacing Pulse Width: 0.5 ms
Lead Channel Setting Sensing Sensitivity: 0.5 mV
Pulse Gen Serial Number: 9816687

## 2019-05-30 ENCOUNTER — Ambulatory Visit (HOSPITAL_COMMUNITY)
Admission: RE | Admit: 2019-05-30 | Discharge: 2019-05-30 | Disposition: A | Discharge status: Home / Self Care | Payer: 59 | Source: Ambulatory Visit | Attending: Cardiology | Admitting: Cardiology

## 2019-05-30 ENCOUNTER — Telehealth (HOSPITAL_COMMUNITY): Payer: Self-pay

## 2019-05-30 ENCOUNTER — Encounter (HOSPITAL_COMMUNITY): Payer: Self-pay | Admitting: Cardiology

## 2019-05-30 ENCOUNTER — Other Ambulatory Visit: Payer: Self-pay

## 2019-05-30 VITALS — BP 104/78 | HR 90 | Wt 188.4 lb

## 2019-05-30 DIAGNOSIS — Z95810 Presence of automatic (implantable) cardiac defibrillator: Secondary | ICD-10-CM | POA: Insufficient documentation

## 2019-05-30 DIAGNOSIS — I110 Hypertensive heart disease with heart failure: Secondary | ICD-10-CM | POA: Diagnosis not present

## 2019-05-30 DIAGNOSIS — I5043 Acute on chronic combined systolic (congestive) and diastolic (congestive) heart failure: Secondary | ICD-10-CM

## 2019-05-30 DIAGNOSIS — I255 Ischemic cardiomyopathy: Secondary | ICD-10-CM | POA: Diagnosis not present

## 2019-05-30 DIAGNOSIS — Z7982 Long term (current) use of aspirin: Secondary | ICD-10-CM | POA: Diagnosis not present

## 2019-05-30 DIAGNOSIS — Z7984 Long term (current) use of oral hypoglycemic drugs: Secondary | ICD-10-CM | POA: Diagnosis not present

## 2019-05-30 DIAGNOSIS — Z951 Presence of aortocoronary bypass graft: Secondary | ICD-10-CM | POA: Diagnosis not present

## 2019-05-30 DIAGNOSIS — I5022 Chronic systolic (congestive) heart failure: Secondary | ICD-10-CM | POA: Diagnosis present

## 2019-05-30 DIAGNOSIS — E119 Type 2 diabetes mellitus without complications: Secondary | ICD-10-CM | POA: Diagnosis not present

## 2019-05-30 DIAGNOSIS — I2510 Atherosclerotic heart disease of native coronary artery without angina pectoris: Secondary | ICD-10-CM | POA: Diagnosis not present

## 2019-05-30 DIAGNOSIS — Z955 Presence of coronary angioplasty implant and graft: Secondary | ICD-10-CM | POA: Diagnosis not present

## 2019-05-30 DIAGNOSIS — Z79899 Other long term (current) drug therapy: Secondary | ICD-10-CM | POA: Insufficient documentation

## 2019-05-30 DIAGNOSIS — Z8249 Family history of ischemic heart disease and other diseases of the circulatory system: Secondary | ICD-10-CM | POA: Insufficient documentation

## 2019-05-30 DIAGNOSIS — E785 Hyperlipidemia, unspecified: Secondary | ICD-10-CM | POA: Diagnosis not present

## 2019-05-30 LAB — BASIC METABOLIC PANEL
Anion gap: 10 (ref 5–15)
BUN: 19 mg/dL (ref 8–23)
CO2: 21 mmol/L — ABNORMAL LOW (ref 22–32)
Calcium: 9.9 mg/dL (ref 8.9–10.3)
Chloride: 104 mmol/L (ref 98–111)
Creatinine, Ser: 1.11 mg/dL — ABNORMAL HIGH (ref 0.44–1.00)
GFR calc Af Amer: >60 mL/min (ref >60)
GFR calc non Af Amer: 53 mL/min — ABNORMAL LOW (ref >60)
Glucose, Bld: 164 mg/dL — ABNORMAL HIGH (ref 70–99)
Potassium: 5.6 mmol/L — ABNORMAL HIGH (ref 3.5–5.1)
Sodium: 135 mmol/L (ref 135–145)

## 2019-05-30 MED ORDER — CARVEDILOL 6.25 MG PO TABS: 9.3750 mg | ORAL_TABLET | Freq: Two times a day (BID) | ORAL | 4 refills | Status: DC

## 2019-05-30 MED ORDER — SPIRONOLACTONE 25 MG PO TABS: 25.0000 mg | ORAL_TABLET | Freq: Every day | ORAL | 3 refills | Status: DC

## 2019-05-30 MED ORDER — ICOSAPENT ETHYL 1 G PO CAPS: 2.0000 g | ORAL_CAPSULE | Freq: Two times a day (BID) | ORAL | 5 refills | Status: DC

## 2019-05-30 NOTE — Telephone Encounter (Signed)
-----   Message from Laurey Morale, MD sent at 05/30/2019  4:15 PM EST ----- Hemolyzed K most likely, do not start higher spironolactone dose until labs redrawn.  Please redraw BMET on Monday, if K is in normal range can increase spironolactone to 25 mg daily.

## 2019-05-30 NOTE — Telephone Encounter (Signed)
Spoke with patient, she will not start higher dose of Spiro until she repeats labs. She will repeat on Monday 2/22 at 12pm.verbalized understanding.

## 2019-05-30 NOTE — Patient Instructions (Addendum)
START Vascepa 2g twice a day (once approved by insurance) Continue fenofibrate until we can get approval.  If we get approval, you can stop fenofibrate.   INCREASE Coreg to 9.375mg  (1.5 tabs) twice a day   INCREASE Spironolactone to 25mg  (1 tab) daily    Labs today and repeat in 10 days We will only contact you if something comes back abnormal or we need to make some changes. Otherwise no news is good news!   Your physician recommends that you schedule a follow-up appointment in: 3 weeks with the pharmacist and 6 weeks with Dr                                                                                                                                                                                                                                                                                                                                                                                                                                                                                                    At [facility], you and your health needs are our priority. As  part of our continuing mission to provide you with exceptional heart care, we have created designated Provider Care Teams. These Care Teams include your primary Cardiologist (physician) and Advanced Practice Providers (APPs- Physician Assistants and Nurse Practitioners) who all work together to provide you with the care you need, when you need it.   You may see any of the following providers on your designated Care Team at your next follow up: Marland Kitchen Dr Glori Bickers . Dr Loralie Champagne . Darrick Grinder, NP . Lyda Jester, PA . Audry Riles, PharmD   Please be sure to bring in all your medications bottles to every appointment.

## 2019-06-01 NOTE — Progress Notes (Signed)
PCP: Jearld Fenton, NP  Cardiology: Dr. Garen Lah HF Cardiology: Dr. Aundra Dubin  64 y.o. with CAD and ischemic cardiomyopathy was referred by Dr. Garen Lah for evaluation of CHF.  She lived in [place] in the past and had her initial cardiology care there.  In 4/19, she had PCI to LAD and LCx with 5 stents. She then had an MI in 5/19 and had CABG with LIMA-LAD and SVG-OM.  She developed an ischemic cardiomyopathy and had a St Jude ICD placed.  She moved to [facility] and has been followed there more recently.  Last echo in 10/20 showed EF 30-35%, mild LV dilation, mild-moderate MR.    She has been stable recently. She gets short of breath walking up a hill but does not have trouble walking on flat ground.  She does fatigue easily.  No claudication.  No chest pain.  No lightheadedness.  She takes all her meds regularly.  She does not smoke. Weight has been stable.   Labs (1/21): K 4.5, creatinine 1.09, LDL 54, TGs 196  ECG (personally reviewed): NSR, poor RWP  PMH: 1. HTN 2. Type 2 diabetes 3. CAD: PCI to LAD and LCx in 4/19, 5 stents altogether.  - MI in 5/19 with CABG (LIMA-LAD, SVG-OM).  4. Hyperlipidemia 5. Chronic systolic CHF: Ischemic cardiomyopathy.   - St Jude ICD.  - Echo (10/20): EF 30-35%, mildly dilated LV, normal RV, mild to moderate MR.  6. Left femoral thrombendarterectomy with patch angioplasty in 9/19.  Complication of Impella.   FH: Mother with CABG in 67s, MI in 24s.   SH: Widowed, lives in [place], nonsmoker. She has children locally.  Not working.   ROS: All systems reviewed and negative except as per HPI.   Current Outpatient Medications  Medication Sig Dispense Refill  . aspirin EC 81 MG tablet Take 81 mg by mouth daily.    . carvedilol (COREG) 6.25 MG tablet Take 1.5 tablets (9.375 mg total) by mouth 2 (two) times daily with a meal. 90 tablet 4  . dapagliflozin propanediol (FARXIGA) 10 MG TABS tablet Take 10 mg by mouth daily. 90 tablet 0  . furosemide  (LASIX) 40 MG tablet Take 40 mg by mouth every other day.    Marland Kitchen glimepiride (AMARYL) 2 MG tablet Take 2 mg by mouth daily with breakfast.    . metFORMIN (GLUCOPHAGE) 1000 MG tablet Take 500 mg by mouth 2 (two) times daily with a meal.    . rosuvastatin (CRESTOR) 40 MG tablet Take 40 mg by mouth daily.    . sacubitril-valsartan (ENTRESTO) 24-26 MG Take 1 tablet by mouth 2 (two) times daily. 60 tablet 5  . spironolactone (ALDACTONE) 25 MG tablet Take 1 tablet (25 mg total) by mouth daily. 90 tablet 3  . icosapent Ethyl (VASCEPA) 1 g capsule Take 2 capsules (2 g total) by mouth 2 (two) times daily. 120 capsule 5   No current facility-administered medications for this encounter.   BP 104/78   Pulse 90   Wt 85.5 kg (188 lb 6.4 oz)   SpO2 98%   BMI 33.37 kg/m  General: NAD Neck: No JVD, no thyromegaly or thyroid nodule.  Lungs: Clear to auscultation bilaterally with normal respiratory effort. CV: Nondisplaced PMI.  Heart regular S1/S2, no S3/S4, 1/6 SEM RUSB.  No peripheral edema.  No carotid bruit.  Normal pedal pulses.  Abdomen: Soft, nontender, no hepatosplenomegaly, no distention.  Skin: Intact without lesions or rashes.  Neurologic: Alert and oriented x 3.  Psych:  Normal affect. Extremities: No clubbing or cyanosis.  HEENT: Normal.   Assessment/Plan: 1. Chronic systolic CHF: Ischemic cardiomyopathy.  St Jude ICD.  Echo (10/20) with EF 30-35%, mild-moderate MR.  On exam, she is not volume overloaded.  NYHA class II symptoms.  - Increase spironolactone to 25 mg daily.  BMET today and in 10 days.  - Increase Coreg to 9.375 mg bid.  - Continue Entresto 24/26 bid.  - Continue current Lasix.  - Will need to add dapagliflozin eventually.  2. CAD: s/p CABG, no chest pain.  - Continue ASA 81 daily.  - Continue Crestor 40 daily.  3. Hyperlipidemia: She is on Crestor and fenofibrate.  - Given benefit in terms of cardiac end points for Vascepa versus fenofibrate, I think that she would  ideally stop fenofibrate and start Vascepa, but need to make sure her insurance will cover it.   Followup with HF pharmacist in 3 wks for med titration.  See me in 6 wks.   Marca Ancona 06/01/2019

## 2019-06-02 ENCOUNTER — Other Ambulatory Visit (HOSPITAL_COMMUNITY): Payer: Self-pay

## 2019-06-02 ENCOUNTER — Telehealth (HOSPITAL_COMMUNITY): Payer: Self-pay | Admitting: Pharmacist

## 2019-06-02 ENCOUNTER — Ambulatory Visit (HOSPITAL_COMMUNITY)
Admission: RE | Admit: 2019-06-02 | Discharge: 2019-06-02 | Disposition: A | Discharge status: Home / Self Care | Payer: 59 | Source: Ambulatory Visit | Attending: Cardiology | Admitting: Cardiology

## 2019-06-02 ENCOUNTER — Other Ambulatory Visit: Payer: Self-pay | Admitting: *Deleted

## 2019-06-02 ENCOUNTER — Other Ambulatory Visit: Payer: Self-pay

## 2019-06-02 DIAGNOSIS — I5022 Chronic systolic (congestive) heart failure: Secondary | ICD-10-CM | POA: Diagnosis not present

## 2019-06-02 LAB — BASIC METABOLIC PANEL
Anion gap: 12 (ref 5–15)
BUN: 26 mg/dL — ABNORMAL HIGH (ref 8–23)
CO2: 26 mmol/L (ref 22–32)
Calcium: 10.1 mg/dL (ref 8.9–10.3)
Chloride: 101 mmol/L (ref 98–111)
Creatinine, Ser: 1.32 mg/dL — ABNORMAL HIGH (ref 0.44–1.00)
GFR calc Af Amer: 50 mL/min — ABNORMAL LOW (ref >60)
GFR calc non Af Amer: 43 mL/min — ABNORMAL LOW (ref >60)
Glucose, Bld: 191 mg/dL — ABNORMAL HIGH (ref 70–99)
Potassium: 4.2 mmol/L (ref 3.5–5.1)
Sodium: 139 mmol/L (ref 135–145)

## 2019-06-02 MED ORDER — ICOSAPENT ETHYL 1 G PO CAPS: 2.0000 g | ORAL_CAPSULE | Freq: Two times a day (BID) | ORAL | 5 refills | Status: DC

## 2019-06-02 MED ORDER — ENTRESTO 24-26 MG PO TABS: 1.0000 | ORAL_TABLET | Freq: Two times a day (BID) | ORAL | 0 refills | Status: DC

## 2019-06-02 NOTE — Telephone Encounter (Signed)
Advanced Heart Failure Patient Advocate Encounter  Prior Authorization for [facility]man Sers has been approved.    Effective dates: 06/02/19 through 06/01/22  Karle Plumber, PharmD, BCPS, BCCP, CPP Heart Failure Clinic Pharmacist [phone]

## 2019-06-02 NOTE — Telephone Encounter (Signed)
Patient Advocate Encounter   Received notification from [employer] that prior authorization for Vascepa is required.   PA submitted on CoverMyMeds Key [id number] Status is pending   Will continue to follow.  Karle Plumber, PharmD, BCPS, BCCP, CPP Heart Failure Clinic Pharmacist [phone]

## 2019-06-02 NOTE — Telephone Encounter (Signed)
Requested Prescriptions   Signed Prescriptions Disp Refills  . sacubitril-valsartan (ENTRESTO) 24-26 MG 180 tablet 0    Sig: Take 1 tablet by mouth 2 (two) times daily.    Authorizing Provider: Debbe Odea    Ordering User: Kendrick Fries

## 2019-06-12 ENCOUNTER — Other Ambulatory Visit: Payer: Self-pay | Admitting: Cardiology

## 2019-06-12 ENCOUNTER — Encounter: Payer: Self-pay | Admitting: Internal Medicine

## 2019-06-12 MED ORDER — FUROSEMIDE 40 MG PO TABS: 40.0000 mg | ORAL_TABLET | ORAL | 0 refills | Status: DC

## 2019-06-12 NOTE — Progress Notes (Signed)
PCP: Jearld Fenton, NP  Cardiology: Dr. Garen Lah HF Cardiology: Dr. Aundra Dubin  HPI:  64 y.o. with CAD and ischemic cardiomyopathy was referred by Dr. Garen Lah for evaluation of CHF.  She lived in [place] in the past and had her initial cardiology care there.  In 4/19, she had PCI to LAD and LCx with 5 stents. She then had an MI in 5/19 and had CABG with LIMA-LAD and SVG-OM.  She developed an ischemic cardiomyopathy and had a St Jude ICD placed.  She moved to [facility] and has been followed there more recently.  Last echo in 10/20 showed EF 30-35%, mild LV dilation, mild-moderate MR.    Recently presented to HF Clinic with Dr. Aundra Dubin on 05/30/19. She had been stable. She reported getting short of breath walking up a hill but did not have trouble walking on flat ground.  She reported fatiguing easily.  No claudication.  No chest pain.  No lightheadedness.  She takes all her meds regularly.  She does not smoke. Weight had been stable.   Today she returns to HF clinic for pharmacist medication titration. At last visit with MD, carvedilol was increased to 9.375 mg BID and spironolactone was increased to 25 mg daily. Overall she is doing well today. She states she is feeling a lot better recently since she started a low carb, no sugar diet. Noted some dizziness when going from a sitting to standing position. This is infrequent and not bothersome. No chest pain or palpitations. She can walk 1-1.5 miles without becoming SOB but does become SOB when walking up stairs. Her weight at home ranges from 183-185 lbs. She self-decreased her furosemide to 40 mg two times per week and has done well with that change. No LEE, PND or orthopnea. Her appetite is good. She avoids processed foods and does not add salt. Taking all medications as prescribed. Tolerating all medications, although she dislikes taking the Vascepa because the capsules are large.    HF Medications: Carvedilol 9.375 mg BID Entresto 24/26 mg  BID Spironolactone 25 mg daily Dapagliflozin 10 mg daily Furosemide 40 mg every other day  Has the patient been experiencing any side effects to the medications prescribed?  no  Does the patient have any problems obtaining medications due to transportation or finances?   No - has Geneticist, molecular of regimen: good Understanding of indications: good Potential of compliance: good Patient understands to avoid NSAIDs. Patient understands to avoid decongestants.    Pertinent Lab Values (06/12/19): Marland Kitchen Serum creatinine 1.14, BUN 28, Potassium 4.7, Sodium 139  Vital Signs: . Weight: 186.8 lbs (last clinic weight: 188.4 lbs) . Blood pressure: 110/72  . Heart rate: 69   Assessment: 1. Chronic systolic CHF: Ischemic cardiomyopathy.  St Jude ICD.  Echo (10/20) with EF 30-35%, mild-moderate MR.   -On exam, she is not volume overloaded.  NYHA class II symptoms.    - Decrease furosemide to 40 mg daily PRN - Increase carvedilol to 12.5 mg BID.  - Continue Entresto 24/26 mg BID.  - Continue spironolactone 25 mg daily.  - Continue dapagliflozin 10 mg daily 2. CAD: s/p CABG, no chest pain.  - Continue ASA 81 daily.  - Continue rosuvastatin 40 daily.  3. Hyperlipidemia:  -Continue rosuvastatin 40 mg daily -Continue Vascepa 4. T2DM -Hemoglobin A1C 7.0% (01/22/19) -Continue metformin 500 mg BID -Continue dapagliflozin 10 mg daily -Continue glimepiride 2 mg daily -Continue rosuvastatin 40 mg daily   Plan: 1) Medication changes: Based on  clinical presentation, vital signs and recent labs will decrease furosemide to 40 mg daily PRN and increase carvedilol to 12.5 mg BID.  2) Follow-up: 3 weeks with PA/NP Clinic   Karle Plumber, PharmD, BCPS, BCCP, CPP Heart Failure Clinic Pharmacist [phone]

## 2019-06-13 LAB — BASIC METABOLIC PANEL WITH GFR
BUN/Creatinine Ratio: 25 (ref 12–28)
BUN: 28 mg/dL — ABNORMAL HIGH (ref 8–27)
CO2: 20 mmol/L (ref 20–29)
Calcium: 10.1 mg/dL (ref 8.7–10.3)
Chloride: 101 mmol/L (ref 96–106)
Creatinine, Ser: 1.14 mg/dL — ABNORMAL HIGH (ref 0.57–1.00)
GFR calc Af Amer: 59 mL/min/1.73 — ABNORMAL LOW
GFR calc non Af Amer: 51 mL/min/1.73 — ABNORMAL LOW
Glucose: 174 mg/dL — ABNORMAL HIGH (ref 65–99)
Potassium: 4.7 mmol/L (ref 3.5–5.2)
Sodium: 139 mmol/L (ref 134–144)

## 2019-06-20 ENCOUNTER — Other Ambulatory Visit (HOSPITAL_COMMUNITY): Payer: Self-pay | Admitting: Cardiology

## 2019-06-24 ENCOUNTER — Other Ambulatory Visit: Payer: Self-pay

## 2019-06-24 ENCOUNTER — Ambulatory Visit (HOSPITAL_COMMUNITY)
Admission: RE | Admit: 2019-06-24 | Discharge: 2019-06-24 | Disposition: A | Discharge status: Home / Self Care | Payer: 59 | Source: Ambulatory Visit | Attending: Cardiology | Admitting: Cardiology

## 2019-06-24 VITALS — BP 110/72 | HR 69 | Wt 186.8 lb

## 2019-06-24 DIAGNOSIS — I2510 Atherosclerotic heart disease of native coronary artery without angina pectoris: Secondary | ICD-10-CM | POA: Diagnosis not present

## 2019-06-24 DIAGNOSIS — E118 Type 2 diabetes mellitus with unspecified complications: Secondary | ICD-10-CM | POA: Insufficient documentation

## 2019-06-24 DIAGNOSIS — E785 Hyperlipidemia, unspecified: Secondary | ICD-10-CM | POA: Insufficient documentation

## 2019-06-24 DIAGNOSIS — I255 Ischemic cardiomyopathy: Secondary | ICD-10-CM | POA: Diagnosis not present

## 2019-06-24 DIAGNOSIS — Z951 Presence of aortocoronary bypass graft: Secondary | ICD-10-CM | POA: Insufficient documentation

## 2019-06-24 DIAGNOSIS — I5022 Chronic systolic (congestive) heart failure: Secondary | ICD-10-CM | POA: Insufficient documentation

## 2019-06-24 MED ORDER — FUROSEMIDE 40 MG PO TABS: 40.0000 mg | ORAL_TABLET | Freq: Every day | ORAL | 0 refills | Status: DC | PRN

## 2019-06-24 MED ORDER — CARVEDILOL 12.5 MG PO TABS: 12.5000 mg | ORAL_TABLET | Freq: Two times a day (BID) | ORAL | 3 refills | Status: DC

## 2019-06-24 NOTE — Patient Instructions (Signed)
It was a pleasure seeing you today!  MEDICATIONS: -We are changing your medications today -Increase carvedilol to 12.5 mg (1 tablet) twice daily. You may take 2 tablets of the 6.25 mg twice daily until you pick up the new strength.  -Call if you have questions about your medications.  NEXT APPOINTMENT: Return to clinic in 3 weeks with NP/PA Clinic.  In general, to take care of your heart failure: -Limit your fluid intake to 2 Liters (half-gallon) per day.   -Limit your salt intake to ideally 2-3 grams (2000-3000 mg) per day. -Weigh yourself daily and record, and bring that "weight diary" to your next appointment.  (Weight gain of 2-3 pounds in 1 day typically means fluid weight.) -The medications for your heart are to help your heart and help you live longer.   -Please contact us before stopping any of your heart medications.  Call the clinic at [phone] with questions or to reschedule future appointments.

## 2019-07-05 ENCOUNTER — Other Ambulatory Visit: Payer: Self-pay | Admitting: Internal Medicine

## 2019-07-08 ENCOUNTER — Ambulatory Visit (INDEPENDENT_AMBULATORY_CARE_PROVIDER_SITE_OTHER): Payer: 59 | Admitting: Cardiology

## 2019-07-08 ENCOUNTER — Other Ambulatory Visit: Payer: Self-pay

## 2019-07-08 ENCOUNTER — Encounter: Payer: Self-pay | Admitting: Cardiology

## 2019-07-08 VITALS — BP 100/78 | HR 78 | Ht 63.0 in | Wt 186.1 lb

## 2019-07-08 DIAGNOSIS — I2510 Atherosclerotic heart disease of native coronary artery without angina pectoris: Secondary | ICD-10-CM

## 2019-07-08 DIAGNOSIS — I5022 Chronic systolic (congestive) heart failure: Secondary | ICD-10-CM | POA: Diagnosis not present

## 2019-07-08 DIAGNOSIS — Z95810 Presence of automatic (implantable) cardiac defibrillator: Secondary | ICD-10-CM | POA: Diagnosis not present

## 2019-07-08 NOTE — Progress Notes (Signed)
Cardiology Office Note:    Date:  07/08/2019   ID:  Monique Franklin, DOB 1955/12/15, MRN [id number]  PCP:  Lorre Munroe, NP  Cardiologist:  Debbe Odea, MD  Electrophysiologist:  None   Referring MD: Lorre Munroe, NP   Chief Complaint  Patient presents with  . office visit    3 month F/U; Meds verbally reviewed with patient.    History of Present Illness:    Monique Franklin is a 64 y.o. female with a hx of hypertension, hyperlipidemia, former smoker, CAD, PCI to LAD and LCx(5 stents total), status post CABG x2 (LIMA to LAD, SVG to OM 2019), HFrEF EF 31%, s/p ICD 2020(St. Jude's device), PAD(left femoral thrombo-endarterectomy with patch angioplasty 12/2017) who presents for follow-up.   Patient was originally seen to establish care.  She used to be followed at [facility] and later moved into the area.  After her CABG in 2019, she had a left femoral artery complication requiring left femoral patch angioplasty.  Earlier in 2020, she had an ICD placed due to low EF with ejection fraction of 31%.  She is a former smoker for about 20 years.  She denies edema, takes her Lasix every other day.  Patient was seen in heart failure clinic in [place] where medications were titrated.   Past Medical History:  Diagnosis Date  . Cardiomyopathy (HCC)   . CHF (congestive heart failure) (HCC)    Class I  . Coronary artery disease   . Diabetes mellitus without complication (HCC)   . GERD (gastroesophageal reflux disease)   . HFrEF (heart failure with reduced ejection fraction) (HCC)   . Hyperlipidemia   . Hypertension   . MI (myocardial infarction) (HCC)   . PAD (peripheral artery disease) (HCC)     Past Surgical History:  Procedure Laterality Date  . BREAST BIOPSY    . CARDIAC CATHETERIZATION    . CARDIAC DEFIBRILLATOR PLACEMENT    . COMBINED AUGMENTATION MAMMAPLASTY AND ABDOMINOPLASTY    . CORONARY ARTERY BYPASS GRAFT  08/2017   X2 with LIMA to LAD; Thrombocytopenia  after CABG;Severe 3 vessel CAD-PCI to LAD and LC x(5stents) Coronary dissection of LM to LAD   . CORONARY ARTERY BYPASS GRAFT    . CORONARY STENT PLACEMENT    . PERCUTANEOUS CORONARY STENT INTERVENTION (PCI-S)    . TONSILLECTOMY      Current Medications: Current Meds  Medication Sig  . aspirin EC 81 MG tablet Take 81 mg by mouth daily.  . carvedilol (COREG) 12.5 MG tablet Take 1 tablet (12.5 mg total) by mouth 2 (two) times daily with a meal.  . dapagliflozin propanediol (FARXIGA) 10 MG TABS tablet Take 10 mg by mouth daily.  . furosemide (LASIX) 40 MG tablet Take 1 tablet (40 mg total) by mouth daily as needed for fluid or edema.  Marland Kitchen glimepiride (AMARYL) 2 MG tablet Take 2 mg by mouth daily with breakfast.  . icosapent Ethyl (VASCEPA) 1 g capsule Take 2 capsules (2 g total) by mouth 2 (two) times daily.  . metFORMIN (GLUCOPHAGE) 1000 MG tablet Take 500 mg by mouth 2 (two) times daily with a meal.  . rosuvastatin (CRESTOR) 40 MG tablet Take 40 mg by mouth daily.  . sacubitril-valsartan (ENTRESTO) 24-26 MG Take 1 tablet by mouth 2 (two) times daily.  Marland Kitchen spironolactone (ALDACTONE) 25 MG tablet Take 1 tablet (25 mg total) by mouth daily.     Allergies:   Brilinta [ticagrelor]   Social History  Socioeconomic History  . Marital status: Widowed    Spouse name: Not on file  . Number of children: Not on file  . Years of education: Not on file  . Highest education level: Not on file  Occupational History  . Not on file  Tobacco Use  . Smoking status: Former Smoker    Packs/day: 1.00    Years: 33.00    Pack years: 33.00  . Smokeless tobacco: Never Used  Substance and Sexual Activity  . Alcohol use: Yes    Comment: occasional  . Drug use: Never  . Sexual activity: Not on file  Other Topics Concern  . Not on file  Social History Narrative  . Not on file   Social Determinants of Health   Financial Resource Strain:   . Difficulty of Paying Living Expenses:   Food Insecurity:     . Worried About Programme researcher, broadcasting/film/video in the Last Year:   . Barista in the Last Year:   Transportation Needs:   . Freight forwarder (Medical):   Marland Kitchen Lack of Transportation (Non-Medical):   Physical Activity:   . Days of Exercise per Week:   . Minutes of Exercise per Session:   Stress:   . Feeling of Stress :   Social Connections:   . Frequency of Communication with Friends and Family:   . Frequency of Social Gatherings with Friends and Family:   . Attends Religious Services:   . Active Member of Clubs or Organizations:   . Attends Banker Meetings:   Marland Kitchen Marital Status:      Family History: The patient's family history includes Breast cancer in her sister; COPD in her father; Diabetes in her brother and sister; Heart attack in her mother; Thyroid cancer in her sister.  ROS:   Please see the history of present illness.     All other systems reviewed and are negative.  EKGs/Labs/Other Studies Reviewed:    The following studies were reviewed today: TTE 02/17/2019 1. Left ventricular ejection fraction, by visual estimation, is 30 to 35%. The left ventricle has severely decreased function. There is no left ventricular hypertrophy.  2. Left ventricular diastolic parameters are consistent with Grade I diastolic dysfunction (impaired relaxation).  3. Mildly dilated left ventricular internal cavity size.  4. The left ventricle demonstrates global hypokinesis.  5. Global right ventricle has normal systolic function.The right ventricular size is normal. No increase in right ventricular wall thickness.  6. Left atrial size was normal.  7. Mild to moderate mitral valve regurgitation.  8. TR signal is inadequate for assessing pulmonary artery systolic pressure.  9. A pacer wire is visualized in the RV.  EKG:  EKG is  ordered today.  The ekg ordered today demonstrates normal sinus rhythm, cannot rule out anterior infarct  Recent Labs: 01/22/2019: ALT 13; Hemoglobin  12.2; Platelets 247.0 06/12/2019: BUN 28; Creatinine, Ser 1.14; Potassium 4.7; Sodium 139  Recent Lipid Panel    Component Value Date/Time   CHOL 148 04/17/2019 0842   TRIG 196.0 (H) 04/17/2019 0842   HDL 54.50 04/17/2019 0842   CHOLHDL 3 04/17/2019 0842   VLDL 39.2 04/17/2019 0842   LDLCALC 54 04/17/2019 0842   LDLDIRECT 68.0 01/22/2019 0759    Physical Exam:    VS:  BP 100/78 (BP Location: Left Arm, Patient Position: Sitting, Cuff Size: Normal)   Pulse 78   Ht 5\' 3"  (1.6 m)   Wt 186 lb 2 oz (84.4 kg)  SpO2 97%   BMI 32.97 kg/m     Wt Readings from Last 3 Encounters:  07/08/19 186 lb 2 oz (84.4 kg)  06/24/19 186 lb 12.8 oz (84.7 kg)  05/30/19 188 lb 6.4 oz (85.5 kg)     GEN:  Well nourished, well developed in no acute distress HEENT: Normal NECK: No JVD; No carotid bruits LYMPHATICS: No lymphadenopathy CARDIAC: RRR, no murmurs, rubs, gallops RESPIRATORY:  Clear to auscultation without rales, wheezing or rhonchi  ABDOMEN: Soft, non-tender, non-distended MUSCULOSKELETAL:  No edema; No deformity  SKIN: Warm and dry NEUROLOGIC:  Alert and oriented x 3 PSYCHIATRIC:  Normal affect   ASSESSMENT:    1. Chronic systolic congestive heart failure ([place])   2. Coronary artery disease involving native coronary artery of native heart without angina pectoris   3. ICD (implantable cardioverter-defibrillator) in place    PLAN:    In order of problems listed above:  1. Patient with history of ischemic cardiomyopathy status post ICD.  NYHA class II symptoms.  Blood pressure is low normal.  Continue Entresto 24-26 mg twice daily.  Plan to titrate Entresto as blood pressure tolerates .  Continue Coreg 12.5mg  twice daily, spironolactone 25 mg daily, Lasix 40 as needed.  Patient counseled to check weights daily and take Lasix if she develops weight gain of over 3 pounds in a day or 5 pounds in a week. 2. History of CAD, PCI's, CABG x2.  No current symptoms of angina.  Continue aspirin 81  mg, Crestor 40 mg daily. 3. ICD in place for ischemic cardiomyopathy.  Keep device checks appointment with EP.  Follow-up in 6 months   Medication Adjustments/Labs and Tests Ordered: Current medicines are reviewed at length with the patient today.  Concerns regarding medicines are outlined above.  Orders Placed This Encounter  Procedures  . EKG 12-Lead   No orders of the defined types were placed in this encounter.   Patient Instructions  Medication Instructions:  Your physician recommends that you continue on your current medications as directed. Please refer to the Current Medication list given to you today.  *If you need a refill on your cardiac medications before your next appointment, please call your pharmacy*   Lab Work: None ordered If you have labs (blood work) drawn today and your tests are completely normal, you will receive your results only by: Marland Kitchen MyChart Message (if you have MyChart) OR . A paper copy in the mail If you have any lab test that is abnormal or we need to change your treatment, we will call you to review the results.   Testing/Procedures: None ordered   Follow-Up: At [facility], you and your health needs are our priority.  As part of our continuing mission to provide you with exceptional heart care, we have created designated Provider Care Teams.  These Care Teams include your primary Cardiologist (physician) and Advanced Practice Providers (APPs -  Physician Assistants and Nurse Practitioners) who all work together to provide you with the care you need, when you need it.  We recommend signing up for the patient portal called "MyChart".  Sign up information is provided on this After Visit Summary.  MyChart is used to connect with patients for Virtual Visits (Telemedicine).  Patients are able to view lab/test results, encounter notes, upcoming appointments, etc.  Non-urgent messages can be sent to your provider as well.   To learn more about what you  can do with MyChart, go to [email or web address].    Your  next appointment:   6 month(s)  The format for your next appointment:   In Person  Provider:    You may see Debbe Odea, MD or one of the following Advanced Practice Providers on your designated Care Team:    Nicolasa Ducking, NP  Eula Listen, PA-C  Marisue Ivan, PA-C    Other Instructions N/A     Signed, Debbe Odea, MD  07/08/2019 10:21 AM    [facility]

## 2019-07-08 NOTE — Patient Instructions (Signed)
Medication Instructions:  Your physician recommends that you continue on your current medications as directed. Please refer to the Current Medication list given to you today.  *If you need a refill on your cardiac medications before your next appointment, please call your pharmacy*   Lab Work: None ordered If you have labs (blood work) drawn today and your tests are completely normal, you will receive your results only by: . MyChart Message (if you have MyChart) OR . A paper copy in the mail If you have any lab test that is abnormal or we need to change your treatment, we will call you to review the results.   Testing/Procedures: None ordered   Follow-Up: At [facility], you and your health needs are our priority.  As part of our continuing mission to provide you with exceptional heart care, we have created designated Provider Care Teams.  These Care Teams include your primary Cardiologist (physician) and Advanced Practice Providers (APPs -  Physician Assistants and Nurse Practitioners) who all work together to provide you with the care you need, when you need it.  We recommend signing up for the patient portal called "MyChart".  Sign up information is provided on this After Visit Summary.  MyChart is used to connect with patients for Virtual Visits (Telemedicine).  Patients are able to view lab/test results, encounter notes, upcoming appointments, etc.  Non-urgent messages can be sent to your provider as well.   To learn more about what you can do with MyChart, go to [email or web address].    Your next appointment:   6 month(s)  The format for your next appointment:   In Person  Provider:    You may see Brian Agbor-Etang, MD or one of the following Advanced Practice Providers on your designated Care Team:    Christopher Berge, NP  Ryan Dunn, PA-C  Jacquelyn Visser, PA-C    Other Instructions N/A  

## 2019-07-14 ENCOUNTER — Other Ambulatory Visit: Payer: Self-pay | Admitting: Internal Medicine

## 2019-07-14 DIAGNOSIS — I10 Essential (primary) hypertension: Secondary | ICD-10-CM

## 2019-07-14 DIAGNOSIS — E119 Type 2 diabetes mellitus without complications: Secondary | ICD-10-CM

## 2019-07-15 ENCOUNTER — Other Ambulatory Visit: Payer: Self-pay

## 2019-07-15 ENCOUNTER — Ambulatory Visit (HOSPITAL_COMMUNITY)
Admission: RE | Admit: 2019-07-15 | Discharge: 2019-07-15 | Disposition: A | Discharge status: Home / Self Care | Payer: 59 | Source: Ambulatory Visit | Attending: Adult Health | Admitting: Adult Health

## 2019-07-15 VITALS — BP 120/80 | HR 64 | Wt 184.0 lb

## 2019-07-15 DIAGNOSIS — Z7982 Long term (current) use of aspirin: Secondary | ICD-10-CM | POA: Diagnosis not present

## 2019-07-15 DIAGNOSIS — E785 Hyperlipidemia, unspecified: Secondary | ICD-10-CM | POA: Insufficient documentation

## 2019-07-15 DIAGNOSIS — Z955 Presence of coronary angioplasty implant and graft: Secondary | ICD-10-CM | POA: Diagnosis not present

## 2019-07-15 DIAGNOSIS — Z79899 Other long term (current) drug therapy: Secondary | ICD-10-CM | POA: Diagnosis not present

## 2019-07-15 DIAGNOSIS — I110 Hypertensive heart disease with heart failure: Secondary | ICD-10-CM | POA: Insufficient documentation

## 2019-07-15 DIAGNOSIS — E119 Type 2 diabetes mellitus without complications: Secondary | ICD-10-CM | POA: Insufficient documentation

## 2019-07-15 DIAGNOSIS — E7800 Pure hypercholesterolemia, unspecified: Secondary | ICD-10-CM | POA: Diagnosis not present

## 2019-07-15 DIAGNOSIS — I2510 Atherosclerotic heart disease of native coronary artery without angina pectoris: Secondary | ICD-10-CM

## 2019-07-15 DIAGNOSIS — Z8249 Family history of ischemic heart disease and other diseases of the circulatory system: Secondary | ICD-10-CM | POA: Insufficient documentation

## 2019-07-15 DIAGNOSIS — Z7984 Long term (current) use of oral hypoglycemic drugs: Secondary | ICD-10-CM | POA: Insufficient documentation

## 2019-07-15 DIAGNOSIS — I255 Ischemic cardiomyopathy: Secondary | ICD-10-CM | POA: Insufficient documentation

## 2019-07-15 DIAGNOSIS — I5022 Chronic systolic (congestive) heart failure: Secondary | ICD-10-CM | POA: Insufficient documentation

## 2019-07-15 DIAGNOSIS — Z951 Presence of aortocoronary bypass graft: Secondary | ICD-10-CM | POA: Insufficient documentation

## 2019-07-15 DIAGNOSIS — Z7901 Long term (current) use of anticoagulants: Secondary | ICD-10-CM | POA: Diagnosis not present

## 2019-07-15 MED ORDER — CARVEDILOL 12.5 MG PO TABS: 18.7500 mg | ORAL_TABLET | Freq: Two times a day (BID) | ORAL | 3 refills | Status: DC

## 2019-07-15 NOTE — Progress Notes (Signed)
PCP: Jearld Fenton, NP  Cardiology: Dr. Garen Lah HF Cardiology: Dr. Aundra Dubin EP: Dr Caryl Comes   64 y.o. with CAD and ischemic cardiomyopathy was referred by Dr. Garen Lah for evaluation of CHF.  She lived in [place] in the past and had her initial cardiology care there.  In 4/19, she had PCI to LAD and LCx with 5 stents. She then had an MI in 5/19 and had CABG with LIMA-LAD and SVG-OM.  She developed an ischemic cardiomyopathy and had a St Jude ICD placed.  She moved to [facility] and has been followed there more recently.  Last echo in 10/20 showed EF 30-35%, mild LV dilation, mild-moderate MR.    Today she returns for HF follow up. Last visit carvedilol was increased to 12.5 mg twice a day. SBP < 110 at home. Overall feeling fine. Denies SOB/PND/Orthopnea. NO chest pain. Not exercising. Appetite ok. No fever or chills. Weight at home 179-180 pounds. Taking all medications.   Labs (1/21): K 4.5, creatinine 1.09, LDL 54, TGs 196  PMH: 1. HTN 2. Type 2 diabetes 3. CAD: PCI to LAD and LCx in 4/19, 5 stents altogether.  - MI in 5/19 with CABG (LIMA-LAD, SVG-OM).  4. Hyperlipidemia 5. Chronic systolic CHF: Ischemic cardiomyopathy.   - St Jude ICD.  - Echo (10/20): EF 30-35%, mildly dilated LV, normal RV, mild to moderate MR.  6. Left femoral thrombendarterectomy with patch angioplasty in 9/19.  Complication of Impella.   FH: Mother with CABG in 66s, MI in 30s.   SH: Widowed, lives in [place], nonsmoker. She has children locally.  Not working.   ROS: All systems reviewed and negative except as per HPI.   Current Outpatient Medications  Medication Sig Dispense Refill  . aspirin EC 81 MG tablet Take 81 mg by mouth daily.    . carvedilol (COREG) 12.5 MG tablet Take 1 tablet (12.5 mg total) by mouth 2 (two) times daily with a meal. 180 tablet 3  . FARXIGA 10 MG TABS tablet TAKE 1 TABLET BY MOUTH EVERY DAY 90 tablet 0  . glimepiride (AMARYL) 2 MG tablet Take 2 mg by mouth daily with  breakfast.    . icosapent Ethyl (VASCEPA) 1 g capsule Take 2 capsules (2 g total) by mouth 2 (two) times daily. 120 capsule 5  . metFORMIN (GLUCOPHAGE) 1000 MG tablet Take 500 mg by mouth 2 (two) times daily with a meal.    . rosuvastatin (CRESTOR) 40 MG tablet Take 40 mg by mouth daily.    . sacubitril-valsartan (ENTRESTO) 24-26 MG Take 1 tablet by mouth 2 (two) times daily. 180 tablet 0  . spironolactone (ALDACTONE) 25 MG tablet Take 1 tablet (25 mg total) by mouth daily. 90 tablet 3  . furosemide (LASIX) 40 MG tablet Take 1 tablet (40 mg total) by mouth daily as needed for fluid or edema. (Patient not taking: Reported on 07/15/2019) 90 tablet 0   No current facility-administered medications for this encounter.   BP 120/80   Pulse 64   Wt 83.5 kg (184 lb)   PF 98 L/min   BMI 32.59 kg/m   Wt Readings from Last 3 Encounters:  07/15/19 83.5 kg (184 lb)  07/08/19 84.4 kg (186 lb 2 oz)  06/24/19 84.7 kg (186 lb 12.8 oz)    General:  Well appearing. No resp difficulty HEENT: normal Neck: supple. no JVD. Carotids 2+ bilat; no bruits. No lymphadenopathy or thryomegaly appreciated. Cor: PMI nondisplaced. Regular rate & rhythm. No rubs, gallops or  murmurs. Lungs: clear Abdomen: soft, nontender, nondistended. No hepatosplenomegaly. No bruits or masses. Good bowel sounds. Extremities: no cyanosis, clubbing, rash, edema Neuro: alert & orientedx3, cranial nerves grossly intact. moves all 4 extremities w/o difficulty. Affect pleasant  Assessment/Plan: 1. Chronic systolic CHF: Ischemic cardiomyopathy.  St Jude ICD.  Echo (10/20) with EF 30-35%, mild-moderate MR.  .  - NYHA II. Volume status stable. Does not need diuretics.  - Continue spironolactone to 25 mg daily.    - Increase coreg to 12.5 mg twice a day.   - Continue Entresto 24/26 bid.  -Continue farxiga 10 mg daily.   - Plan bmet at PCP next week.  2. CAD: s/p CABG - No chest pain.  - Continue ASA 81 daily.  - Continue Crestor 40  daily.  - Asked her to start walking.  3. Hyperlipidemia: She is on Crestor and vascepa.    Follow up with Dr Shirlee Latch in 3-4 months.      Monique Bernards Np-C  07/15/2019

## 2019-07-15 NOTE — Patient Instructions (Signed)
INCREASE Coreg to 18.75 mg, one and one half tab twice daily  Your physician recommends that you schedule a follow-up appointment in: 2 months with Dr Shirlee Latch  Do the following things EVERYDAY: 1) Weigh yourself in the morning before breakfast. Write it down and keep it in a log. 2) Take your medicines as prescribed 3) Eat low salt foods--Limit salt (sodium) to 2000 mg per day.  4) Stay as active as you can everyday 5) Limit all fluids for the day to less than 2 liters   At the [facility], you and your health needs are our priority. As part of our continuing mission to provide you with exceptional heart care, we have created designated Provider Care Teams. These Care Teams include your primary Cardiologist (physician) and Advanced Practice Providers (APPs- Physician Assistants and Nurse Practitioners) who all work together to provide you with the care you need, when you need it.   You may see any of the following providers on your designated Care Team at your next follow up: Marland Kitchen Dr Arvilla Meres . Dr Marca Ancona . Tonye Becket, NP . Robbie Lis, PA . Karle Plumber, PharmD   Please be sure to bring in all your medications bottles to every appointment.

## 2019-07-16 ENCOUNTER — Other Ambulatory Visit: Payer: Self-pay

## 2019-07-21 ENCOUNTER — Other Ambulatory Visit: Payer: Self-pay

## 2019-07-21 ENCOUNTER — Other Ambulatory Visit (INDEPENDENT_AMBULATORY_CARE_PROVIDER_SITE_OTHER): Payer: 59

## 2019-07-21 DIAGNOSIS — E119 Type 2 diabetes mellitus without complications: Secondary | ICD-10-CM | POA: Diagnosis not present

## 2019-07-21 DIAGNOSIS — I10 Essential (primary) hypertension: Secondary | ICD-10-CM

## 2019-07-21 LAB — BASIC METABOLIC PANEL
BUN: 14 mg/dL (ref 6–23)
CO2: 25 mEq/L (ref 19–32)
Calcium: 9.4 mg/dL (ref 8.4–10.5)
Chloride: 108 mEq/L (ref 96–112)
Creatinine, Ser: 0.94 mg/dL (ref 0.40–1.20)
GFR: 59.93 mL/min — ABNORMAL LOW (ref >60.00)
Glucose, Bld: 196 mg/dL — ABNORMAL HIGH (ref 70–99)
Potassium: 4.2 mEq/L (ref 3.5–5.1)
Sodium: 139 mEq/L (ref 135–145)

## 2019-07-21 LAB — HEMOGLOBIN A1C: Hgb A1c MFr Bld: 7.1 % — ABNORMAL HIGH (ref 4.6–6.5)

## 2019-07-28 ENCOUNTER — Ambulatory Visit (INDEPENDENT_AMBULATORY_CARE_PROVIDER_SITE_OTHER): Payer: 59 | Admitting: *Deleted

## 2019-07-28 DIAGNOSIS — I429 Cardiomyopathy, unspecified: Secondary | ICD-10-CM | POA: Diagnosis not present

## 2019-07-28 LAB — CUP PACEART REMOTE DEVICE CHECK
Battery Remaining Longevity: 88 mo
Battery Remaining Percentage: 85 %
Battery Voltage: 3.02 V
Brady Statistic RV Percent Paced: 1 %
Date Time Interrogation Session: 20210419020016
HighPow Impedance: 75 Ohm
HighPow Impedance: 75 Ohm
Implantable Lead Implant Date: 20200707
Implantable Lead Location: 753860
Implantable Pulse Generator Implant Date: 20200707
Lead Channel Impedance Value: 650 Ohm
Lead Channel Pacing Threshold Amplitude: 0.5 V
Lead Channel Pacing Threshold Pulse Width: 0.5 ms
Lead Channel Sensing Intrinsic Amplitude: 11.9 mV
Lead Channel Setting Pacing Amplitude: 2.5 V
Lead Channel Setting Pacing Pulse Width: 0.5 ms
Lead Channel Setting Sensing Sensitivity: 0.5 mV
Pulse Gen Serial Number: 9816687

## 2019-07-29 NOTE — Progress Notes (Signed)
ICD Remote  

## 2019-08-25 ENCOUNTER — Other Ambulatory Visit: Payer: Self-pay | Admitting: *Deleted

## 2019-08-25 MED ORDER — ENTRESTO 24-26 MG PO TABS: 1.0000 | ORAL_TABLET | Freq: Two times a day (BID) | ORAL | 0 refills | Status: DC

## 2019-08-26 ENCOUNTER — Encounter (HOSPITAL_COMMUNITY): Payer: Self-pay

## 2019-08-26 ENCOUNTER — Other Ambulatory Visit (HOSPITAL_COMMUNITY): Payer: Self-pay | Admitting: *Deleted

## 2019-08-26 MED ORDER — ICOSAPENT ETHYL 1 G PO CAPS: 2.0000 g | ORAL_CAPSULE | Freq: Two times a day (BID) | ORAL | 5 refills | Status: DC

## 2019-09-08 ENCOUNTER — Other Ambulatory Visit: Payer: Self-pay | Admitting: Internal Medicine

## 2019-09-17 ENCOUNTER — Ambulatory Visit (INDEPENDENT_AMBULATORY_CARE_PROVIDER_SITE_OTHER): Payer: 59 | Admitting: Obstetrics

## 2019-09-17 ENCOUNTER — Other Ambulatory Visit: Payer: Self-pay

## 2019-09-17 ENCOUNTER — Encounter: Payer: Self-pay | Admitting: Obstetrics

## 2019-09-17 ENCOUNTER — Other Ambulatory Visit: Payer: Self-pay | Admitting: Internal Medicine

## 2019-09-17 VITALS — BP 120/80 | Ht 63.0 in | Wt 188.0 lb

## 2019-09-17 DIAGNOSIS — B372 Candidiasis of skin and nail: Secondary | ICD-10-CM | POA: Diagnosis not present

## 2019-09-17 DIAGNOSIS — B373 Candidiasis of vulva and vagina: Secondary | ICD-10-CM

## 2019-09-17 DIAGNOSIS — B3731 Acute candidiasis of vulva and vagina: Secondary | ICD-10-CM

## 2019-09-17 DIAGNOSIS — N898 Other specified noninflammatory disorders of vagina: Secondary | ICD-10-CM | POA: Diagnosis not present

## 2019-09-17 LAB — POCT WET PREP (WET MOUNT)
Clue Cells Wet Prep Whiff POC: NEGATIVE
Trichomonas Wet Prep HPF POC: ABSENT

## 2019-09-17 MED ORDER — FLUCONAZOLE 150 MG PO TABS: 150.0000 mg | ORAL_TABLET | ORAL | 5 refills | Status: AC

## 2019-09-17 MED ORDER — NYSTATIN-TRIAMCINOLONE 100000-0.1 UNIT/GM-% EX CREA: 1.0000 "application " | TOPICAL_CREAM | Freq: Two times a day (BID) | CUTANEOUS | 1 refills | Status: DC

## 2019-09-17 NOTE — Addendum Note (Signed)
Addended by: Mirna Mires on: 09/17/2019 04:44 PM   Modules accepted: Orders

## 2019-09-17 NOTE — Progress Notes (Signed)
Ms. Monique Franklin is a 64 y.o. G0P0000 who LMP was No LMP recorded. Patient is postmenopausal., presents today for a problem visit.   Patient complains of an abnormal vaginal discharge for 8 months. Discharge described as: scant and odorless. Vaginal symptoms include local irritation and vulvar itching.   Other associated symptoms: none.Menstrual pattern:postmenopausal  Contraception: NA- menopausal.  She denies recent antibiotic exposure, denies changes in soaps, detergents coinciding with the onset of her symptoms.  She has not previously self treated or been under treatment by another provider for these symptoms. She does not like to use vaginal creams like Monistat. She has used diflucan in the distant past. [name] has not had a GYN in many years. She is overdue for a pap smear Socially, she is widowed; lives with her sister. She is retired. Not sexually active. Uses a vibrator rarely- without lubrication.  O: Genital area : Normal hair distribution No rashes or lesions noted.  Reddened pruritic area along labia majora and minora bilaterally noted. Redness noted perianally. Some normal vaginal atrophy noted Vagina is pink to ruddy in color. Walls smooh with little rugae Less than Scant discharge noted Bimanual: No CMT noted. Anteverted uterus, non enlarged with no adnexal tenderness, ovaries not felt.  Saline wet [place]: few clue cells, no whiff, + buds noted, few hyphae noted few WBCs. Nuswab pending.  A: yeast vaginitis       Yeast dermatitis       Vaginal atrophy ( post menopausal) Plan 1) Risk factors for bacterial vaginosis and candida infections discussed.  We discussed normal vaginal flora/microbiome.  Any factors that may alter the microbiome increase the risk of these opportunistic infections.  These include changes in pH, antibiotic exposures, diabetes, wet bathing suits etc.  We discussed that treatment is aimed at eradicating abnormal bacterial overgrowth and or yeast.  There may  be some role for vaginal probiotics in restoring normal vaginal flora.    Rx Diflucan Rx Mycolog  Also strongly encouraged her to make an appt for an Annual GYN PE to address normal postmenopausal issues ie vaginal atrophy.  Mirna Mires, CNM  09/17/2019 11:26 AM

## 2019-09-22 LAB — NUSWAB VAGINITIS PLUS (VG+)
Candida albicans, NAA: NEGATIVE
Candida glabrata, NAA: NEGATIVE
Chlamydia trachomatis, NAA: NEGATIVE
Neisseria gonorrhoeae, NAA: NEGATIVE
Trich vag by NAA: NEGATIVE

## 2019-10-02 ENCOUNTER — Other Ambulatory Visit (HOSPITAL_COMMUNITY)
Admission: RE | Admit: 2019-10-02 | Discharge: 2019-10-02 | Disposition: A | Discharge status: Home / Self Care | Payer: 59 | Source: Ambulatory Visit | Attending: Obstetrics and Gynecology | Admitting: Obstetrics and Gynecology

## 2019-10-02 ENCOUNTER — Other Ambulatory Visit: Payer: Self-pay

## 2019-10-02 ENCOUNTER — Encounter: Payer: Self-pay | Admitting: Obstetrics and Gynecology

## 2019-10-02 ENCOUNTER — Ambulatory Visit (INDEPENDENT_AMBULATORY_CARE_PROVIDER_SITE_OTHER): Payer: 59 | Admitting: Obstetrics and Gynecology

## 2019-10-02 VITALS — BP 124/72 | HR 67 | Resp 18 | Ht 63.0 in | Wt 188.6 lb

## 2019-10-02 DIAGNOSIS — Z0000 Encounter for general adult medical examination without abnormal findings: Secondary | ICD-10-CM | POA: Diagnosis not present

## 2019-10-02 DIAGNOSIS — Z01419 Encounter for gynecological examination (general) (routine) without abnormal findings: Secondary | ICD-10-CM

## 2019-10-02 DIAGNOSIS — N904 Leukoplakia of vulva: Secondary | ICD-10-CM

## 2019-10-02 DIAGNOSIS — Z124 Encounter for screening for malignant neoplasm of cervix: Secondary | ICD-10-CM

## 2019-10-02 DIAGNOSIS — Z1231 Encounter for screening mammogram for malignant neoplasm of breast: Secondary | ICD-10-CM

## 2019-10-02 DIAGNOSIS — Z1211 Encounter for screening for malignant neoplasm of colon: Secondary | ICD-10-CM

## 2019-10-02 DIAGNOSIS — R350 Frequency of micturition: Secondary | ICD-10-CM

## 2019-10-02 MED ORDER — CLOBETASOL PROPIONATE 0.05 % EX OINT: TOPICAL_OINTMENT | CUTANEOUS | 5 refills | Status: DC

## 2019-10-02 NOTE — Progress Notes (Signed)
Gynecology Annual Exam  PCP: Jearld Fenton, NP  Chief Complaint:  Chief Complaint  Patient presents with  . Gynecologic Exam    History of Present Illness:Patient is a 64 y.o. G0P0000 presents for annual exam. The patient has no complaints today.   LMP: No LMP recorded. Patient is postmenopausal. Menarche:12  Menopause: 17 History of regular monthly periods without issue Remote history of genital warts Normal pap smear, no history of cervical dysplasia Denies postmenopausal bleeding   The patient does perform self breast exams.  There is notable family history of breast or ovarian cancer in her family. Younger sister with breast cancer. Patient has had a breast reduction  The patient wears seatbelts: no.     The patient denies current symptoms of depression.     Patient reports that she has been having issues with frequent urination.  She reports that she has to use the restroom every 1-2 hours.  She generally urinates 12 or more times in a 24-hour period.  She reports that sometimes she will get a sudden urge and may have asked that if she does not get to the bathroom quick enough.  She denies any history of glaucoma.  She is not currently interested on being on in the medications.  She does take medications that can worsen her urinary frequency.  She recently discontinued Lasix for this reason.    She was seen earlier this month by Gigi Gin our nurse midwife and started on ointment (mycolog) for vaginal and vulvar irritation.  Testing at that time did not show that she had any yeast or other infectious causes of her vaginal irritation.  She reports that the topical ointment has been helping significantly.  She reports a history of being being treated for low bone mass in the past.  Review of Systems: Review of Systems  Constitutional: Negative for chills, fever, malaise/fatigue and weight loss.  HENT: Negative for congestion, hearing loss and sinus pain.   Eyes:  Negative for blurred vision and double vision.  Respiratory: Negative for cough, sputum production, shortness of breath and wheezing.   Cardiovascular: Negative for chest pain, palpitations, orthopnea and leg swelling.  Gastrointestinal: Negative for abdominal pain, constipation, diarrhea, nausea and vomiting.  Genitourinary: Positive for frequency and urgency. Negative for dysuria, flank pain and hematuria.  Musculoskeletal: Negative for back pain, falls and joint pain.  Skin: Negative for itching and rash.  Neurological: Negative for dizziness and headaches.  Psychiatric/Behavioral: Negative for depression, substance abuse and suicidal ideas. The patient is not nervous/anxious.     Past Medical History:  Patient Active Problem List   Diagnosis Date Noted  . CHF (congestive heart failure) ([place]) 01/03/2019  . Cardiomyopathy ([place]) 01/03/2019  . CAD (coronary artery disease) 01/03/2019  . Diabetes mellitus without complication ([place]) 71/69/6789  . Hypertension 01/03/2019  . Hyperlipidemia 01/03/2019  . MI (myocardial infarction) ([place]) 01/03/2019  . GERD (gastroesophageal reflux disease) 01/03/2019    Past Surgical History:  Past Surgical History:  Procedure Laterality Date  . BREAST BIOPSY    . CARDIAC CATHETERIZATION    . CARDIAC DEFIBRILLATOR PLACEMENT    . COMBINED AUGMENTATION MAMMAPLASTY AND ABDOMINOPLASTY    . CORONARY ARTERY BYPASS GRAFT  08/2017   X2 with LIMA to LAD; 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;Severe 3 vessel CAD-PCI to LAD and LC x(5stents) Coronary dissection of LM to LAD   . CORONARY ARTERY BYPASS GRAFT    . CORONARY STENT PLACEMENT    . PERCUTANEOUS CORONARY STENT  INTERVENTION (PCI-S)    . TONSILLECTOMY      Gynecologic History:  No LMP recorded. Patient is postmenopausal. Last Pap: Results were: unknown Last mammogram: 2 years ago, normal per patient. Patient was in [place],  Obstetric History: G0P0000  Family History:  Family History  Problem Relation  Age of Onset  . Heart attack Mother   . COPD Father   . Thyroid cancer Sister   . Breast cancer Sister   . Diabetes Sister   . Diabetes Brother     Social History:  Social History   Socioeconomic History  . Marital status: Widowed    Spouse name: Not on file  . Number of children: Not on file  . Years of education: Not on file  . Highest education level: Not on file  Occupational History  . Not on file  Tobacco Use  . Smoking status: Former Smoker    Packs/day: 1.00    Years: 33.00    Pack years: 33.00  . Smokeless tobacco: Never Used  Vaping Use  . Vaping Use: Never used  Substance and Sexual Activity  . Alcohol use: Yes    Comment: occasional  . Drug use: Never  . Sexual activity: Not on file  Other Topics Concern  . Not on file  Social History Narrative  . Not on file   Social Determinants of Health   Financial Resource Strain:   . Difficulty of Paying Living Expenses:   Food Insecurity:   . Worried About Programme researcher, broadcasting/film/video in the Last Year:   . Barista in the Last Year:   Transportation Needs:   . Freight forwarder (Medical):   Marland Kitchen Lack of Transportation (Non-Medical):   Physical Activity:   . Days of Exercise per Week:   . Minutes of Exercise per Session:   Stress:   . Feeling of Stress :   Social Connections:   . Frequency of Communication with Friends and Family:   . Frequency of Social Gatherings with Friends and Family:   . Attends Religious Services:   . Active Member of Clubs or Organizations:   . Attends Banker Meetings:   Marland Kitchen Marital Status:   Intimate Partner Violence:   . Fear of Current or Ex-Partner:   . Emotionally Abused:   Marland Kitchen Physically Abused:   . Sexually Abused:     Allergies:  Allergies  Allergen Reactions  . Brilinta [Ticagrelor]     Medications: Prior to Admission medications   Medication Sig Start Date End Date Taking? Authorizing Provider  aspirin EC 81 MG tablet Take 81 mg by mouth daily.    Yes [provider]  carvedilol (COREG) 12.5 MG tablet Take 1.5 tablets (18.75 mg total) by mouth 2 (two) times daily with a meal. 07/15/19  Yes Clegg, Amy D, NP  carvedilol (COREG) 6.25 MG tablet TAKE 1 TABLET (6.25 MG TOTAL) BY MOUTH 2 (TWO) TIMES DAILY WITH A MEAL. 09/18/19  Yes Baity, Salvadore Oxford, NP  FARXIGA 10 MG TABS tablet TAKE 1 TABLET BY MOUTH EVERY DAY 07/14/19  Yes Baity, Salvadore Oxford, NP  fluconazole (DIFLUCAN) 150 MG tablet Take 1 tablet (150 mg total) by mouth once a week for 4 doses. 09/17/19 10/09/19 Yes Mirna Mires, CNM  furosemide (LASIX) 40 MG tablet Take 1 tablet (40 mg total) by mouth daily as needed for fluid or edema. 06/24/19  Yes Laurey Morale, MD  glimepiride (AMARYL) 2 MG tablet Take 2 mg by  mouth daily with breakfast.   Yes [provider]  icosapent Ethyl (VASCEPA) 1 g capsule Take 2 capsules (2 g total) by mouth 2 (two) times daily. 08/26/19  Yes Laurey Morale, MD  metFORMIN (GLUCOPHAGE) 1000 MG tablet Take 500 mg by mouth 2 (two) times daily with a meal.   Yes [provider]  nystatin-triamcinolone (MYCOLOG II) cream Apply 1 application topically 2 (two) times daily. 09/17/19  Yes Mirna Mires, CNM  rosuvastatin (CRESTOR) 40 MG tablet Take 40 mg by mouth daily.   Yes [provider]  sacubitril-valsartan (ENTRESTO) 24-26 MG Take 1 tablet by mouth 2 (two) times daily. 08/25/19  Yes Debbe Odea, MD  spironolactone (ALDACTONE) 25 MG tablet Take 1 tablet (25 mg total) by mouth daily. 05/30/19  Yes Laurey Morale, MD    Physical Exam Vitals: Blood pressure 124/72, pulse 67, resp. rate 18, height 5\' 3"  (1.6 m), weight 188 lb 9.6 oz (85.5 kg), SpO2 98 %.  General: NAD HEENT: normocephalic, anicteric Thyroid: no enlargement, no palpable nodules Pulmonary: No increased work of breathing, CTAB Cardiovascular: RRR, distal pulses 2+ Breast: Breast symmetrical, no tenderness, no palpable nodules or masses, no skin or nipple retraction  present, no nipple discharge.  No axillary or supraclavicular lymphadenopathy. Abdomen: NABS, soft, non-tender, non-distended.  Umbilicus without lesions.  No hepatomegaly, splenomegaly or masses palpable. No evidence of hernia  Genitourinary:  External:  Vulva red, loss of labia minora, some clitoral agglutination, small midline fissure above clitoris.  Figure of eight appearance consistent with lichen sclerosis   Vagina: Normal vaginal mucosa, no evidence of prolapse.    Cervix: Grossly normal in appearance, no bleeding  Uterus: Non-enlarged, mobile, normal contour.  No CMT  Adnexa: ovaries non-enlarged, no adnexal masses  Rectal: deferred  Lymphatic: no evidence of inguinal lymphadenopathy Extremities: no edema, erythema, or tenderness Neurologic: Grossly intact Psychiatric: mood appropriate, affect full  Female chaperone present for pelvic and breast  portions of the physical exam   Assessment: 64 y.o. G0P0000 routine annual exam  Plan: Problem List Items Addressed This Visit    None    Visit Diagnoses    Encounter for annual routine gynecological examination    -  Primary   Health maintenance examination       Breast cancer screening by mammogram       Cervical cancer screening       Screening for colon cancer       Lichen sclerosus et atrophicus of the vulva          1) Mammogram - recommend yearly screening mammogram.  Mammogram Was ordered today  2) STI screening  was notoffered and therefore not obtained  3) ASCCP guidelines and rational discussed.  Patient opts for every 3 years screening interval  4) Osteoporosis  - per USPTF routine screening DEXA at age 82 - FRAX 10 year major fracture risk 7.9,  10 year hip fracture risk 0.8  5) Routine healthcare maintenance including cholesterol, diabetes screening discussed managed by PCP  6) Colonoscopy referral mave.  Screening recommended starting at age 34 for average risk individuals, age 27 for individuals deemed at  increased risk (including African Americans) and recommended to continue until age 72.  For patient age 35-85 individualized approach is recommended.  Gold standard screening is via colonoscopy, Cologuard screening is an acceptable alternative for patient unwilling or unable to undergo colonoscopy.  "Colorectal cancer screening for average?risk adults: 2018 guideline update from the American Cancer Society"[place]: A  Cancer Journal for Clinicians: Sep 06, 2016   7) Lichen Sclerosis- clobetasol ointment ordered. Discussed yearly monotring, will follow up in 3 months   8) Urinary frequency- Patient not interested in medical management at this time. Given hand out. Referral to urogynecology made.  9) Follow up in 3 months   Adelene Idler MD, [facility], [facility] 10/02/2019 12:39 PM

## 2019-10-02 NOTE — Patient Instructions (Signed)
Institute of Medicine Recommended Dietary Allowances for Calcium and Vitamin D  Age (yr) Calcium Recommended Dietary Allowance (mg/day) Vitamin D Recommended Dietary Allowance (international units/day)  9-18 1,300 600  19-50 1,000 600  51-70 1,200 600  71 and older 1,200 800  Data from Institute of Medicine. Dietary reference intakes: calcium, vitamin D. [place], [place]: National Academies Press; 2011.     Exercising to Stay Healthy To become healthy and stay healthy, it is recommended that you do moderate-intensity and vigorous-intensity exercise. You can tell that you are exercising at a moderate intensity if your heart starts beating faster and you start breathing faster but can still hold a conversation. You can tell that you are exercising at a vigorous intensity if you are breathing much harder and faster and cannot hold a conversation while exercising. Exercising regularly is important. It has many health benefits, such as:  Improving overall fitness, flexibility, and endurance.  Increasing bone density.  Helping with weight control.  Decreasing body fat.  Increasing muscle strength.  Reducing stress and tension.  Improving overall health. How often should I exercise? Choose an activity that you enjoy, and set realistic goals. Your health care provider can help you make an activity plan that works for you. Exercise regularly as told by your health care provider. This may include:  Doing strength training two times a week, such as: ? Lifting weights. ? Using resistance bands. ? Push-ups. ? Sit-ups. ? Yoga.  Doing a certain intensity of exercise for a given amount of time. Choose from these options: ? A total of 150 minutes of moderate-intensity exercise every week. ? A total of 75 minutes of vigorous-intensity exercise every week. ? A mix of moderate-intensity and vigorous-intensity exercise every week. Children, pregnant women, people who have not exercised  regularly, people who are overweight, and older adults may need to talk with a health care provider about what activities are safe to do. If you have a medical condition, be sure to talk with your health care provider before you start a new exercise program. What are some exercise ideas? Moderate-intensity exercise ideas include:  Walking 1 mile (1.6 km) in about 15 minutes.  Biking.  Hiking.  Golfing.  Dancing.  Water aerobics. Vigorous-intensity exercise ideas include:  Walking 4.5 miles (7.2 km) or more in about 1 hour.  Jogging or running 5 miles (8 km) in about 1 hour.  Biking 10 miles (16.1 km) or more in about 1 hour.  Lap swimming.  Roller-skating or in-line skating.  Cross-country skiing.  Vigorous competitive sports, such as football, basketball, and soccer.  Jumping rope.  Aerobic dancing. What are some everyday activities that can help me to get exercise?  Yard work, such as: ? Pushing a lawn mower. ? Raking and bagging leaves.  Washing your car.  Pushing a stroller.  Shoveling snow.  Gardening.  Washing windows or floors. How can I be more active in my day-to-day activities?  Use stairs instead of an elevator.  Take a walk during your lunch break.  If you drive, park your car farther away from your work or school.  If you take public transportation, get off one stop early and walk the rest of the way.  Stand up or walk around during all of your indoor phone calls.  Get up, stretch, and walk around every 30 minutes throughout the day.  Enjoy exercise with a friend. Support to continue exercising will help you keep a regular routine of activity. What guidelines can   I follow while exercising?  Before you start a new exercise program, talk with your health care provider.  Do not exercise so much that you hurt yourself, feel dizzy, or get very short of breath.  Wear comfortable clothes and wear shoes with good support.  Drink plenty of  water while you exercise to prevent dehydration or heat stroke.  Work out until your breathing and your heartbeat get faster. Where to find more information  U.S[place] Department of Health and Human Services: www.hhs.gov  Centers for Disease Control and Prevention (CDC): www.cdc.gov Summary  Exercising regularly is important. It will improve your overall fitness, flexibility, and endurance.  Regular exercise also will improve your overall health. It can help you control your weight, reduce stress, and improve your bone density.  Do not exercise so much that you hurt yourself, feel dizzy, or get very short of breath.  Before you start a new exercise program, talk with your health care provider. This information is not intended to replace advice given to you by your health care provider. Make sure you discuss any questions you have with your health care provider. Document Revised: 03/09/2017 Document Reviewed: 02/15/2017 Elsevier Patient Education  2020 Elsevier Inc.   Budget-Friendly Healthy Eating There are many ways to save money at the grocery store and continue to eat healthy. You can be successful if you:  Plan meals according to your budget.  Make a grocery list and only purchase food according to your grocery list.  Prepare food yourself. What are tips for following this plan?  Reading food labels  Compare food labels between brand name foods and the store brand. Often the nutritional value is the same, but the store brand is lower cost.  Look for products that do not have added sugar, fat, or salt (sodium). These often cost the same but are healthier for you. Products may be labeled as: ? Sugar-free. ? Nonfat. ? Low-fat. ? Sodium-free. ? Low-sodium.  Look for lean ground beef labeled as at least 92% lean and 8% fat. Shopping  Buy only the items on your grocery list and go only to the areas of the store that have the items on your list.  Use coupons only for foods  and brands you normally buy. Avoid buying items you wouldn't normally buy simply because they are on sale.  Check online and in newspapers for weekly deals.  Buy healthy items from the bulk bins when available, such as herbs, spices, flour, pasta, nuts, and dried fruit.  Buy fruits and vegetables that are in season. Prices are usually lower on in-season produce.  Look at the unit price on the price tag. Use it to compare different brands and sizes to find out which item is the best deal.  Choose healthy items that are often low-cost, such as carrots, potatoes, apples, bananas, and oranges. Dried or canned beans are a low-cost protein source.  Buy in bulk and freeze extra food. Items you can buy in bulk include meats, fish, poultry, frozen fruits, and frozen vegetables.  Avoid buying "ready-to-eat" foods, such as pre-cut fruits and vegetables and pre-made salads.  If possible, shop around to discover where you can find the best prices. Consider other retailers such as dollar stores, larger wholesale stores, local fruit and vegetable stands, and farmers markets.  Do not shop when you are hungry. If you shop while hungry, it may be hard to stick to your list and budget.  Resist impulse buying. Use your grocery list as   your official plan for the week.  Buy a variety of vegetables and fruits by purchasing fresh, frozen, and canned items.  Look at the top and bottom shelves for deals. Foods at eye level (eye level of an adult or child) are usually more expensive.  Be efficient with your time when shopping. The more time you spend at the store, the more money you are likely to spend.  To save money when choosing more expensive foods like meats and dairy: ? Choose cheaper cuts of meat, such as bone-in chicken thighs and drumsticks instead of skinless and boneless chicken. When you are ready to prepare the chicken, you can remove the skin yourself to make it healthier. ? Choose lean meats like  chicken or turkey instead of beef. ? Choose canned seafood, such as tuna, salmon, or sardines. ? Buy eggs as a low-cost source of protein. ? Buy dried beans and peas, such as lentils, split peas, or kidney beans instead of meats. Dried beans and peas are a good alternative source of protein. ? Buy the larger tubs of yogurt instead of individual-sized containers.  Choose water instead of sodas and other sweetened beverages.  Avoid buying chips, cookies, and other "junk food." These items are usually expensive and not healthy. Cooking  Make extra food and freeze the extras in meal-sized containers or in individual portions for fast meals and snacks.  Pre-cook on days when you have extra time to prepare meals in advance. You can keep these meals in the fridge or freezer and reheat for a quick meal.  When you come home from the grocery store, wash, peel, and cut fruits and vegetables so they are ready to use and eat. This will help reduce food waste. Meal planning  Do not eat out or get fast food. Prepare food at home.  Make a grocery list and make sure to bring it with you to the store. If you have a smart phone, you could use your phone to create your shopping list.  Plan meals and snacks according to a grocery list and budget you create.  Use leftovers in your meal plan for the week.  Look for recipes where you can cook once and make enough food for two meals.  Include budget-friendly meals like stews, casseroles, and stir-fry dishes.  Try some meatless meals or try "no cook" meals like salads.  Make sure that half your plate is filled with fruits or vegetables. Choose from fresh, frozen, or canned fruits and vegetables. If eating canned, remember to rinse them before eating. This will remove any excess salt added for packaging. Summary  Eating healthy on a budget is possible if you plan your meals according to your budget, purchase according to your budget and grocery list, and  prepare food yourself.  Tips for buying more food on a limited budget include buying generic brands, using coupons only for foods you normally buy, and buying healthy items from the bulk bins when available.  Tips for buying cheaper food to replace expensive food include choosing cheaper, lean cuts of meat, and buying dried beans and peas. This information is not intended to replace advice given to you by your health care provider. Make sure you discuss any questions you have with your health care provider. Document Revised: 03/28/2017 Document Reviewed: 03/28/2017 Elsevier Patient Education  2020 Elsevier Inc.   Bone Health Bones protect organs, store calcium, anchor muscles, and support the whole body. Keeping your bones strong is important, especially as you   get older. You can take actions to help keep your bones strong and healthy. Why is keeping my bones healthy important?  Keeping your bones healthy is important because your body constantly replaces bone cells. Cells get old, and new cells take their place. As we age, we lose bone cells because the body may not be able to make enough new cells to replace the old cells. The amount of bone cells and bone tissue you have is referred to as bone mass. The higher your bone mass, the stronger your bones. The aging process leads to an overall loss of bone mass in the body, which can increase the likelihood of:  Joint pain and stiffness.  Broken bones.  A condition in which the bones become weak and brittle (osteoporosis). A large decline in bone mass occurs in older adults. In women, it occurs about the time of menopause. What actions can I take to keep my bones healthy? Good health habits are important for maintaining healthy bones. This includes eating nutritious foods and exercising regularly. To have healthy bones, you need to get enough of the right minerals and vitamins. Most nutrition experts recommend getting these nutrients from the  foods that you eat. In some cases, taking supplements may also be recommended. Doing certain types of exercise is also important for bone health. What are the nutritional recommendations for healthy bones?  Eating a well-balanced diet with plenty of calcium and vitamin D will help to protect your bones. Nutritional recommendations vary from person to person. Ask your health care provider what is healthy for you. Here are some general guidelines. Get enough calcium Calcium is the most important (essential) mineral for bone health. Most people can get enough calcium from their diet, but supplements may be recommended for people who are at risk for osteoporosis. Good sources of calcium include:  Dairy products, such as low-fat or nonfat milk, cheese, and yogurt.  Dark green leafy vegetables, such as bok choy and broccoli.  Calcium-fortified foods, such as orange juice, cereal, bread, soy beverages, and tofu products.  Nuts, such as almonds. Follow these recommended amounts for daily calcium intake:  Children, age 1-3: 700 mg.  Children, age 4-8: 1,000 mg.  Children, age 9-13: 1,300 mg.  Teens, age 14-18: 1,300 mg.  Adults, age 19-50: 1,000 mg.  Adults, age 51-70: ? Men: 1,000 mg. ? Women: 1,200 mg.  Adults, age 71 or older: 1,200 mg.  Pregnant and breastfeeding females: ? Teens: 1,300 mg. ? Adults: 1,000 mg. Get enough vitamin D Vitamin D is the most essential vitamin for bone health. It helps the body absorb calcium. Sunlight stimulates the skin to make vitamin D, so be sure to get enough sunlight. If you live in a cold climate or you do not get outside often, your health care provider may recommend that you take vitamin D supplements. Good sources of vitamin D in your diet include:  Egg yolks.  Saltwater fish.  Milk and cereal fortified with vitamin D. Follow these recommended amounts for daily vitamin D intake:  Children and teens, age 1-18: 600 international  units.  Adults, age 50 or younger: 400-800 international units.  Adults, age 51 or older: 800-1,000 international units. Get other important nutrients Other nutrients that are important for bone health include:  Phosphorus. This mineral is found in meat, poultry, dairy foods, nuts, and legumes. The recommended daily intake for adult men and adult women is 700 mg.  Magnesium. This mineral is found in seeds, nuts, dark   green vegetables, and legumes. The recommended daily intake for adult men is 400-420 mg. For adult women, it is 310-320 mg.  Vitamin K. This vitamin is found in green leafy vegetables. The recommended daily intake is 120 mg for adult men and 90 mg for adult women. What type of physical activity is best for building and maintaining healthy bones? Weight-bearing and strength-building activities are important for building and maintaining healthy bones. Weight-bearing activities cause muscles and bones to work against gravity. Strength-building activities increase the strength of the muscles that support bones. Weight-bearing and muscle-building activities include:  Walking and hiking.  Jogging and running.  Dancing.  Gym exercises.  Lifting weights.  Tennis and racquetball.  Climbing stairs.  Aerobics. Adults should get at least 30 minutes of moderate physical activity on most days. Children should get at least 60 minutes of moderate physical activity on most days. Ask your health care provider what type of exercise is best for you. How can I find out if my bone mass is low? Bone mass can be measured with an X-ray test called a bone mineral density (BMD) test. This test is recommended for all women who are age 65 or older. It may also be recommended for:  Men who are age 70 or older.  People who are at risk for osteoporosis because of: ? Having bones that break easily. ? Having a long-term disease that weakens bones, such as kidney disease or rheumatoid  arthritis. ? Having menopause earlier than normal. ? Taking medicine that weakens bones, such as steroids, thyroid hormones, or hormone treatment for breast cancer or prostate cancer. ? Smoking. ? Drinking three or more alcoholic drinks a day. If you find that you have a low bone mass, you may be able to prevent osteoporosis or further bone loss by changing your diet and lifestyle. Where can I find more information? For more information, check out the following websites:  National Osteoporosis Foundation: www.nof.org/patients  National Institutes of Health: www.bones.nih.gov  International Osteoporosis Foundation: www.iofbonehealth.org Summary  The aging process leads to an overall loss of bone mass in the body, which can increase the likelihood of broken bones and osteoporosis.  Eating a well-balanced diet with plenty of calcium and vitamin D will help to protect your bones.  Weight-bearing and strength-building activities are also important for building and maintaining strong bones.  Bone mass can be measured with an X-ray test called a bone mineral density (BMD) test. This information is not intended to replace advice given to you by your health care provider. Make sure you discuss any questions you have with your health care provider. Document Revised: 04/23/2017 Document Reviewed: 04/23/2017 Elsevier Patient Education  2020 Elsevier Inc.   

## 2019-10-06 LAB — CYTOLOGY - PAP
Comment: NEGATIVE
Diagnosis: NEGATIVE
High risk HPV: NEGATIVE

## 2019-10-08 ENCOUNTER — Ambulatory Visit
Admission: RE | Admit: 2019-10-08 | Discharge: 2019-10-08 | Disposition: A | Discharge status: Home / Self Care | Payer: 59 | Source: Ambulatory Visit | Attending: Obstetrics and Gynecology | Admitting: Obstetrics and Gynecology

## 2019-10-08 DIAGNOSIS — Z1231 Encounter for screening mammogram for malignant neoplasm of breast: Secondary | ICD-10-CM | POA: Insufficient documentation

## 2019-10-09 ENCOUNTER — Telehealth (INDEPENDENT_AMBULATORY_CARE_PROVIDER_SITE_OTHER): Payer: Self-pay | Admitting: Gastroenterology

## 2019-10-09 ENCOUNTER — Other Ambulatory Visit (INDEPENDENT_AMBULATORY_CARE_PROVIDER_SITE_OTHER): Payer: Self-pay

## 2019-10-09 DIAGNOSIS — Z1211 Encounter for screening for malignant neoplasm of colon: Secondary | ICD-10-CM

## 2019-10-09 NOTE — Progress Notes (Signed)
Gastroenterology Pre-Procedure Review  Request Date: Friday 10/24/19 Requesting Physician: Dr. Maximino Greenland  PATIENT REVIEW QUESTIONS: The patient responded to the following health history questions as indicated:    1. Are you having any GI issues? no 2. Do you have a personal history of Polyps? no 3. Do you have a family history of Colon Cancer or Polyps? no 4. Diabetes Mellitus? yes (oral meds) 5. Joint replacements in the past 12 months?no 6. Major health problems in the past 3 months?no 7. Any artificial heart valves, MVP, or defibrillator?no    MEDICATIONS & ALLERGIES:    Patient reports the following regarding taking any anticoagulation/antiplatelet therapy:   Plavix, Coumadin, Eliquis, Xarelto, Lovenox, Pradaxa, Brilinta, or Effient? no Aspirin? no  Patient confirms/reports the following medications:  Current Outpatient Medications  Medication Sig Dispense Refill  . aspirin EC 81 MG tablet Take 81 mg by mouth daily.    . carvedilol (COREG) 12.5 MG tablet Take 1.5 tablets (18.75 mg total) by mouth 2 (two) times daily with a meal. 90 tablet 3  . carvedilol (COREG) 6.25 MG tablet TAKE 1 TABLET (6.25 MG TOTAL) BY MOUTH 2 (TWO) TIMES DAILY WITH A MEAL. 60 tablet 1  . clobetasol ointment (TEMOVATE) 0.05 % Apply to affected area every night for 4 weeks, then every other day for 4 weeks and then twice a week for 4 weeks or until resolution. 30 g 5  . FARXIGA 10 MG TABS tablet TAKE 1 TABLET BY MOUTH EVERY DAY 90 tablet 0  . fluconazole (DIFLUCAN) 150 MG tablet Take 1 tablet (150 mg total) by mouth once a week for 4 doses. 4 tablet 5  . furosemide (LASIX) 40 MG tablet Take 1 tablet (40 mg total) by mouth daily as needed for fluid or edema. 90 tablet 0  . glimepiride (AMARYL) 2 MG tablet Take 2 mg by mouth daily with breakfast.    . icosapent Ethyl (VASCEPA) 1 g capsule Take 2 capsules (2 g total) by mouth 2 (two) times daily. 120 capsule 5  . lisinopril (ZESTRIL) 5 MG tablet Take by mouth.     . metFORMIN (GLUCOPHAGE) 1000 MG tablet Take 500 mg by mouth 2 (two) times daily with a meal.    . rosuvastatin (CRESTOR) 40 MG tablet Take 40 mg by mouth daily.    . sacubitril-valsartan (ENTRESTO) 24-26 MG Take 1 tablet by mouth 2 (two) times daily. 180 tablet 0  . spironolactone (ALDACTONE) 25 MG tablet Take 1 tablet (25 mg total) by mouth daily. 90 tablet 3  . nystatin-triamcinolone (MYCOLOG II) cream Apply 1 application topically 2 (two) times daily. 30 g 1   No current facility-administered medications for this visit.    Patient confirms/reports the following allergies:  Allergies  Allergen Reactions  . Brilinta [Ticagrelor]     No orders of the defined types were placed in this encounter.   AUTHORIZATION INFORMATION Primary Insurance: 1D#: Group #:  Secondary Insurance: 1D#: Group #:  SCHEDULE INFORMATION: Date: Friday 10/24/19 Time: Location:[facility]

## 2019-10-15 ENCOUNTER — Other Ambulatory Visit: Payer: Self-pay | Admitting: Internal Medicine

## 2019-10-19 ENCOUNTER — Other Ambulatory Visit: Payer: Self-pay | Admitting: Internal Medicine

## 2019-10-20 ENCOUNTER — Ambulatory Visit (HOSPITAL_COMMUNITY)
Admission: RE | Admit: 2019-10-20 | Discharge: 2019-10-20 | Disposition: A | Discharge status: Home / Self Care | Payer: 59 | Source: Ambulatory Visit | Attending: Cardiology | Admitting: Cardiology

## 2019-10-20 ENCOUNTER — Other Ambulatory Visit: Payer: Self-pay

## 2019-10-20 ENCOUNTER — Encounter (HOSPITAL_COMMUNITY): Payer: Self-pay | Admitting: Cardiology

## 2019-10-20 VITALS — BP 116/74 | HR 70 | Wt 189.0 lb

## 2019-10-20 DIAGNOSIS — Z8249 Family history of ischemic heart disease and other diseases of the circulatory system: Secondary | ICD-10-CM | POA: Diagnosis not present

## 2019-10-20 DIAGNOSIS — I509 Heart failure, unspecified: Secondary | ICD-10-CM | POA: Diagnosis not present

## 2019-10-20 DIAGNOSIS — I255 Ischemic cardiomyopathy: Secondary | ICD-10-CM | POA: Insufficient documentation

## 2019-10-20 DIAGNOSIS — Z7982 Long term (current) use of aspirin: Secondary | ICD-10-CM | POA: Insufficient documentation

## 2019-10-20 DIAGNOSIS — Z79899 Other long term (current) drug therapy: Secondary | ICD-10-CM | POA: Insufficient documentation

## 2019-10-20 DIAGNOSIS — E785 Hyperlipidemia, unspecified: Secondary | ICD-10-CM | POA: Insufficient documentation

## 2019-10-20 DIAGNOSIS — I5022 Chronic systolic (congestive) heart failure: Secondary | ICD-10-CM | POA: Diagnosis not present

## 2019-10-20 DIAGNOSIS — E119 Type 2 diabetes mellitus without complications: Secondary | ICD-10-CM | POA: Insufficient documentation

## 2019-10-20 DIAGNOSIS — Z955 Presence of coronary angioplasty implant and graft: Secondary | ICD-10-CM | POA: Insufficient documentation

## 2019-10-20 DIAGNOSIS — Z951 Presence of aortocoronary bypass graft: Secondary | ICD-10-CM | POA: Insufficient documentation

## 2019-10-20 DIAGNOSIS — Z7984 Long term (current) use of oral hypoglycemic drugs: Secondary | ICD-10-CM | POA: Insufficient documentation

## 2019-10-20 DIAGNOSIS — I2510 Atherosclerotic heart disease of native coronary artery without angina pectoris: Secondary | ICD-10-CM | POA: Diagnosis not present

## 2019-10-20 DIAGNOSIS — I110 Hypertensive heart disease with heart failure: Secondary | ICD-10-CM | POA: Insufficient documentation

## 2019-10-20 LAB — BASIC METABOLIC PANEL
Anion gap: 13 (ref 5–15)
BUN: 18 mg/dL (ref 8–23)
CO2: 21 mmol/L — ABNORMAL LOW (ref 22–32)
Calcium: 10.6 mg/dL — ABNORMAL HIGH (ref 8.9–10.3)
Chloride: 102 mmol/L (ref 98–111)
Creatinine, Ser: 0.93 mg/dL (ref 0.44–1.00)
GFR calc Af Amer: >60 mL/min (ref >60)
GFR calc non Af Amer: >60 mL/min (ref >60)
Glucose, Bld: 156 mg/dL — ABNORMAL HIGH (ref 70–99)
Potassium: 4.6 mmol/L (ref 3.5–5.1)
Sodium: 136 mmol/L (ref 135–145)

## 2019-10-20 LAB — LIPID PANEL
Cholesterol: 156 mg/dL (ref 0–200)
HDL: 57 mg/dL (ref >40)
LDL Cholesterol: 56 mg/dL (ref 0–99)
Total CHOL/HDL Ratio: 2.7 RATIO
Triglycerides: 215 mg/dL — ABNORMAL HIGH (ref <150)
VLDL: 43 mg/dL — ABNORMAL HIGH (ref 0–40)

## 2019-10-20 MED ORDER — ENTRESTO 49-51 MG PO TABS
1.0000 | ORAL_TABLET | Freq: Two times a day (BID) | ORAL | 6 refills | Status: DC
Start: 2019-10-20 — End: 2019-12-01

## 2019-10-20 NOTE — Progress Notes (Signed)
PCP: Lorre Munroe, NP  Cardiology: Dr. Azucena Cecil HF Cardiology: Dr. Shirlee Latch  64 y.o. with CAD and ischemic cardiomyopathy was referred by Dr. Azucena Cecil for evaluation of CHF.  She lived in [place] in the past and had her initial cardiology care there.  In 4/19, she had PCI to LAD and LCx with 5 stents. She then had an MI in 5/19 and had CABG with LIMA-LAD and SVG-OM.  She developed an ischemic cardiomyopathy and had a St Jude ICD placed.  She moved to [facility] and has been followed there more recently.  Last echo in 10/20 showed EF 30-35%, mild LV dilation, mild-moderate MR.    She returns for followup of CHF.  She seems to be doing well, no significant exertional dyspnea.  No chest pain.  No orthopnea/PND.  Weight is up 5 lbs, she is not getting much exercise. No lightheadedness.  No palpitations.  No orthopnea/PND.   Labs (1/21): K 4.5, creatinine 1.09, LDL 54, TGs 196 Labs (4/21): K 4.2, creatinine 0.94  St Jude device interrogation: Stable thoracic impedance.   PMH: 1. HTN 2. Type 2 diabetes 3. CAD: PCI to LAD and LCx in 4/19, 5 stents altogether.  - MI in 5/19 with CABG (LIMA-LAD, SVG-OM).  4. Hyperlipidemia 5. Chronic systolic CHF: Ischemic cardiomyopathy.   - St Jude ICD.  - Echo (10/20): EF 30-35%, mildly dilated LV, normal RV, mild to moderate MR.  6. Left femoral thrombendarterectomy with patch angioplasty in 9/19.  Complication of Impella.   FH: Mother with CABG in 43s, MI in 69s.   SH: Widowed, lives in [place], nonsmoker. She has children locally.  Not working.   ROS: All systems reviewed and negative except as per HPI.   Current Outpatient Medications  Medication Sig Dispense Refill  . aspirin EC 81 MG tablet Take 81 mg by mouth daily.    . carvedilol (COREG) 12.5 MG tablet Take 1.5 tablets (18.75 mg total) by mouth 2 (two) times daily with a meal. 90 tablet 3  . carvedilol (COREG) 6.25 MG tablet TAKE 1 TABLET (6.25 MG TOTAL) BY MOUTH 2 (TWO) TIMES DAILY  WITH A MEAL. 60 tablet 1  . clobetasol ointment (TEMOVATE) 0.05 % Apply to affected area every night for 4 weeks, then every other day for 4 weeks and then twice a week for 4 weeks or until resolution. 30 g 5  . FARXIGA 10 MG TABS tablet TAKE 1 TABLET BY MOUTH EVERY DAY 90 tablet 0  . furosemide (LASIX) 40 MG tablet Take 1 tablet (40 mg total) by mouth daily as needed for fluid or edema. 90 tablet 0  . glimepiride (AMARYL) 2 MG tablet Take 2 mg by mouth daily with breakfast.    . icosapent Ethyl (VASCEPA) 1 g capsule Take 2 capsules (2 g total) by mouth 2 (two) times daily. 120 capsule 5  . lisinopril (ZESTRIL) 5 MG tablet Take by mouth.    . metFORMIN (GLUCOPHAGE) 1000 MG tablet Take 500 mg by mouth 2 (two) times daily with a meal.    . rosuvastatin (CRESTOR) 40 MG tablet Take 40 mg by mouth daily.    Marland Kitchen spironolactone (ALDACTONE) 25 MG tablet Take 1 tablet (25 mg total) by mouth daily. 90 tablet 3  . sacubitril-valsartan (ENTRESTO) 49-51 MG Take 1 tablet by mouth 2 (two) times daily. 60 tablet 6   No current facility-administered medications for this encounter.   BP 116/74   Pulse 70   Wt 85.7 kg (189 lb)  SpO2 97%   BMI 33.48 kg/m  General: NAD Neck: No JVD, no thyromegaly or thyroid nodule.  Lungs: Clear to auscultation bilaterally with normal respiratory effort. CV: Nondisplaced PMI.  Heart regular S1/S2, no S3/S4, no murmur.  No peripheral edema.  No carotid bruit.  Normal pedal pulses.  Abdomen: Soft, nontender, no hepatosplenomegaly, no distention.  Skin: Intact without lesions or rashes.  Neurologic: Alert and oriented x 3.  Psych: Normal affect. Extremities: No clubbing or cyanosis.  HEENT: Normal.   Assessment/Plan: 1. Chronic systolic CHF: Ischemic cardiomyopathy.  St Jude ICD.  Echo (10/20) with EF 30-35%, mild-moderate MR.  She is not volume overloaded by exam or [place].  NYHA class II symptoms.  - Continue spironolactone 25 mg daily.    - Continue Coreg 18.75 mg bid.   - Increase Entresto to 49/51 bid. BMET today and in 10 days.   - Has not needed Lasix.  - Continue dapagliflozin 10 mg daily.  - Echo at followup appt.  2. CAD: s/p CABG, no chest pain.  - Continue ASA 81 daily.  - Continue Crestor 40 daily.  3. Hyperlipidemia: She is on Crestor and Vascepa.   - Check lipids today.   Followup in 3 months with echo.   Marca Ancona 10/20/2019

## 2019-10-20 NOTE — Patient Instructions (Signed)
INCREASE Entresto to 49/51 mg, one tab twice daily  Labs today We will only contact you if something comes back abnormal or we need to make some changes. Otherwise no news is good news!  Labs needed 2 weeks  Your physician recommends that you schedule a follow-up appointment in: 3 months with Dr Shirlee Latch and echo  Your physician has requested that you have an echocardiogram. Echocardiography is a painless test that uses sound waves to create images of your heart. It provides your doctor with information about the size and shape of your heart and how well your heart's chambers and valves are working. This procedure takes approximately one hour. There are no restrictions for this procedure.   Do the following things EVERYDAY: 1) Weigh yourself in the morning before breakfast. Write it down and keep it in a log. 2) Take your medicines as prescribed 3) Eat low salt foods--Limit salt (sodium) to 2000 mg per day.  4) Stay as active as you can everyday 5) Limit all fluids for the day to less than 2 liters  At the [facility], you and your health needs are our priority. As part of our continuing mission to provide you with exceptional heart care, we have created designated Provider Care Teams. These Care Teams include your primary Cardiologist (physician) and Advanced Practice Providers (APPs- Physician Assistants and Nurse Practitioners) who all work together to provide you with the care you need, when you need it.   You may see any of the following providers on your designated Care Team at your next follow up: Marland Kitchen Dr Arvilla Meres . Dr Marca Ancona . Tonye Becket, NP . Robbie Lis, PA . Karle Plumber, PharmD   Please be sure to bring in all your medications bottles to every appointment.

## 2019-10-27 ENCOUNTER — Ambulatory Visit (INDEPENDENT_AMBULATORY_CARE_PROVIDER_SITE_OTHER): Payer: 59 | Admitting: *Deleted

## 2019-10-27 DIAGNOSIS — I509 Heart failure, unspecified: Secondary | ICD-10-CM | POA: Diagnosis not present

## 2019-10-27 DIAGNOSIS — I429 Cardiomyopathy, unspecified: Secondary | ICD-10-CM

## 2019-10-28 LAB — CUP PACEART REMOTE DEVICE CHECK
Battery Remaining Longevity: 85 mo
Battery Remaining Percentage: 83 %
Battery Voltage: 3.01 V
Brady Statistic RV Percent Paced: 1 %
Date Time Interrogation Session: 20210719020017
HighPow Impedance: 81 Ohm
HighPow Impedance: 81 Ohm
Implantable Lead Implant Date: 20200707
Implantable Lead Location: 753860
Implantable Pulse Generator Implant Date: 20200707
Lead Channel Impedance Value: 660 Ohm
Lead Channel Pacing Threshold Amplitude: 0.5 V
Lead Channel Pacing Threshold Pulse Width: 0.5 ms
Lead Channel Sensing Intrinsic Amplitude: 11.7 mV
Lead Channel Setting Pacing Amplitude: 2.5 V
Lead Channel Setting Pacing Pulse Width: 0.5 ms
Lead Channel Setting Sensing Sensitivity: 0.5 mV
Pulse Gen Serial Number: 9816687

## 2019-10-28 NOTE — Progress Notes (Signed)
Remote ICD transmission.   

## 2019-10-29 ENCOUNTER — Other Ambulatory Visit: Payer: Self-pay

## 2019-10-29 ENCOUNTER — Other Ambulatory Visit
Admission: RE | Admit: 2019-10-29 | Discharge: 2019-10-29 | Disposition: A | Discharge status: Home / Self Care | Payer: 59 | Source: Ambulatory Visit | Attending: Gastroenterology | Admitting: Gastroenterology

## 2019-10-29 DIAGNOSIS — Z01812 Encounter for preprocedural laboratory examination: Secondary | ICD-10-CM | POA: Diagnosis not present

## 2019-10-29 DIAGNOSIS — Z20822 Contact with and (suspected) exposure to covid-19: Secondary | ICD-10-CM | POA: Diagnosis not present

## 2019-10-29 LAB — SARS CORONAVIRUS 2 (TAT 6-24 HRS): SARS Coronavirus 2: NEGATIVE

## 2019-10-30 ENCOUNTER — Encounter: Payer: Self-pay | Admitting: Gastroenterology

## 2019-10-31 ENCOUNTER — Ambulatory Visit: Payer: 59 | Admitting: Anesthesiology

## 2019-10-31 ENCOUNTER — Other Ambulatory Visit: Payer: Self-pay

## 2019-10-31 ENCOUNTER — Encounter
Admission: RE | Disposition: A | Discharge status: Home / Self Care | Payer: Self-pay | Source: Home / Self Care | Attending: Gastroenterology

## 2019-10-31 ENCOUNTER — Ambulatory Visit
Admission: RE | Admit: 2019-10-31 | Discharge: 2019-10-31 | Disposition: A | Discharge status: Home / Self Care | Payer: 59 | Attending: Gastroenterology | Admitting: Gastroenterology

## 2019-10-31 ENCOUNTER — Encounter: Payer: Self-pay | Admitting: Gastroenterology

## 2019-10-31 DIAGNOSIS — Z1211 Encounter for screening for malignant neoplasm of colon: Secondary | ICD-10-CM | POA: Diagnosis present

## 2019-10-31 DIAGNOSIS — E785 Hyperlipidemia, unspecified: Secondary | ICD-10-CM | POA: Diagnosis not present

## 2019-10-31 DIAGNOSIS — Z87891 Personal history of nicotine dependence: Secondary | ICD-10-CM | POA: Diagnosis not present

## 2019-10-31 DIAGNOSIS — I509 Heart failure, unspecified: Secondary | ICD-10-CM | POA: Diagnosis not present

## 2019-10-31 DIAGNOSIS — Z951 Presence of aortocoronary bypass graft: Secondary | ICD-10-CM | POA: Diagnosis not present

## 2019-10-31 DIAGNOSIS — Z95810 Presence of automatic (implantable) cardiac defibrillator: Secondary | ICD-10-CM | POA: Insufficient documentation

## 2019-10-31 DIAGNOSIS — E1151 Type 2 diabetes mellitus with diabetic peripheral angiopathy without gangrene: Secondary | ICD-10-CM | POA: Diagnosis not present

## 2019-10-31 DIAGNOSIS — I110 Hypertensive heart disease with heart failure: Secondary | ICD-10-CM | POA: Diagnosis not present

## 2019-10-31 DIAGNOSIS — Z7982 Long term (current) use of aspirin: Secondary | ICD-10-CM | POA: Diagnosis not present

## 2019-10-31 DIAGNOSIS — I2510 Atherosclerotic heart disease of native coronary artery without angina pectoris: Secondary | ICD-10-CM | POA: Diagnosis not present

## 2019-10-31 DIAGNOSIS — I252 Old myocardial infarction: Secondary | ICD-10-CM | POA: Diagnosis not present

## 2019-10-31 DIAGNOSIS — Z7984 Long term (current) use of oral hypoglycemic drugs: Secondary | ICD-10-CM | POA: Insufficient documentation

## 2019-10-31 DIAGNOSIS — K5730 Diverticulosis of large intestine without perforation or abscess without bleeding: Secondary | ICD-10-CM | POA: Diagnosis not present

## 2019-10-31 DIAGNOSIS — K219 Gastro-esophageal reflux disease without esophagitis: Secondary | ICD-10-CM | POA: Diagnosis not present

## 2019-10-31 DIAGNOSIS — K648 Other hemorrhoids: Secondary | ICD-10-CM | POA: Insufficient documentation

## 2019-10-31 DIAGNOSIS — Z79899 Other long term (current) drug therapy: Secondary | ICD-10-CM | POA: Insufficient documentation

## 2019-10-31 DIAGNOSIS — I429 Cardiomyopathy, unspecified: Secondary | ICD-10-CM | POA: Insufficient documentation

## 2019-10-31 DIAGNOSIS — Z955 Presence of coronary angioplasty implant and graft: Secondary | ICD-10-CM | POA: Diagnosis not present

## 2019-10-31 HISTORY — PX: COLONOSCOPY WITH PROPOFOL: SHX5780

## 2019-10-31 SURGERY — COLONOSCOPY WITH PROPOFOL
Anesthesia: General

## 2019-10-31 MED ORDER — PROPOFOL 10 MG/ML IV BOLUS
INTRAVENOUS | Status: DC | PRN
  Administered 2019-10-31: 50 mg via INTRAVENOUS

## 2019-10-31 MED ORDER — PROPOFOL 500 MG/50ML IV EMUL
INTRAVENOUS | Status: DC | PRN
  Administered 2019-10-31: 140 ug/kg/min via INTRAVENOUS

## 2019-10-31 MED ORDER — SODIUM CHLORIDE 0.9 % IV SOLN
INTRAVENOUS | Status: DC
  Administered 2019-10-31: 1000 mL via INTRAVENOUS

## 2019-10-31 MED ORDER — PHENYLEPHRINE HCL (PRESSORS) 10 MG/ML IV SOLN
INTRAVENOUS | Status: DC | PRN
  Administered 2019-10-31 (×2): 100 ug via INTRAVENOUS

## 2019-10-31 NOTE — Anesthesia Procedure Notes (Signed)
Date/Time: 10/31/2019 12:14 PM Performed by: Junious Silk, CRNA Pre-anesthesia Checklist: Patient identified, Emergency Drugs available, Suction available, Patient being monitored and Timeout performed Oxygen Delivery Method: Nasal cannula

## 2019-10-31 NOTE — Op Note (Signed)
[facility] Gastroenterology Patient Name: Monique Franklin Procedure Date: 10/31/2019 11:47 AM MRN: [id number] Account #: [id number] Date of Birth: 08-Jun-1955 Admit Type: Outpatient Age: 64 Room: [facility] ENDO ROOM 4 Gender: Female Note Status: Finalized Procedure:             Colonoscopy Indications:           Screening for colorectal malignant neoplasm Providers:             Lemarcus Baggerly B. Maximino Greenland MD, MD Referring MD:          Lorre Munroe (Referring MD) Medicines:             Monitored Anesthesia Care Complications:         No immediate complications. Procedure:             Pre-Anesthesia Assessment:                        - Prior to the procedure, a History and Physical was                         performed, and patient medications, allergies and                         sensitivities were reviewed. The patient's tolerance                         of previous anesthesia was reviewed.                        - The risks and benefits of the procedure and the                         sedation options and risks were discussed with the                         patient. All questions were answered and informed                         consent was obtained.                        - Patient identification and proposed procedure were                         verified prior to the procedure by the physician, the                         nurse, the anesthetist and the technician. The                         procedure was verified in the pre-procedure area in                         the procedure room in the endoscopy suite.                        - ASA Grade Assessment: II - A patient with mild  systemic disease.                        - After reviewing the risks and benefits, the patient                         was deemed in satisfactory condition to undergo the                         procedure.                        After obtaining informed consent, the  colonoscope was                         passed under direct vision. Throughout the procedure,                         the patient's blood pressure, pulse, and oxygen                         saturations were monitored continuously. The                         Colonoscope was introduced through the anus and                         advanced to the the cecum, identified by appendiceal                         orifice and ileocecal valve. The colonoscopy was                         performed with ease. The patient tolerated the                         procedure well. The quality of the bowel preparation                         was fair. Findings:      The perianal and digital rectal examinations were normal.      Multiple diverticula were found in the sigmoid colon.      The exam was otherwise without abnormality.      The rectum, sigmoid colon, descending colon, transverse colon, ascending       colon and cecum appeared normal.      Non-bleeding internal hemorrhoids were found during retroflexion. The       hemorrhoids were small. Impression:            - Preparation of the colon was fair.                        - Diverticulosis in the sigmoid colon.                        - The examination was otherwise normal.                        - The rectum, sigmoid colon, descending colon,  transverse colon, ascending colon and cecum are normal.                        - Non-bleeding internal hemorrhoids.                        - No specimens collected. Recommendation:        - Discharge patient to home.                        - Resume previous diet.                        - Continue present medications.                        - Repeat colonoscopy in 1 year, with 2 day prep.                        - Return to primary care physician as previously                         scheduled.                        - The findings and recommendations were discussed with                          the patient.                        - The findings and recommendations were discussed with                         the patient's family.                        - High fiber diet. Procedure Code(s):     --- Professional ---                        [phone], Colonoscopy, flexible; diagnostic, including                         collection of specimen(s) by brushing or washing, when                         performed (separate procedure) Diagnosis Code(s):     --- Professional ---                        Z12.11, Encounter for screening for malignant neoplasm                         of colon CPT copyright 2019 American Medical Association. All rights reserved. The codes documented in this report are preliminary and upon coder review may  be revised to meet current compliance requirements.  Melodie Bouillon, MD Michel Bickers B. Maximino Greenland MD, MD 10/31/2019 12:30:13 PM This report has been signed electronically. Number of Addenda: 0 Note Initiated On: 10/31/2019 11:47 AM Scope Withdrawal Time: 0 hours 14 minutes 3 seconds  Total Procedure Duration: 0 hours 22 minutes 4 seconds  Estimated Blood Loss:  Estimated blood loss: none.      [facility]

## 2019-10-31 NOTE — Anesthesia Postprocedure Evaluation (Signed)
Anesthesia Post Note  Patient: Monique Franklin  Procedure(s) Performed: COLONOSCOPY WITH PROPOFOL (N/A )  Patient location during evaluation: Endoscopy Anesthesia Type: General Level of consciousness: awake and alert Pain management: pain level controlled Vital Signs Assessment: post-procedure vital signs reviewed and stable Respiratory status: spontaneous breathing, nonlabored ventilation, respiratory function stable and patient connected to nasal cannula oxygen Cardiovascular status: blood pressure returned to baseline and stable Postop Assessment: no apparent nausea or vomiting Anesthetic complications: no   No complications documented.   Last Vitals:  Vitals:   10/31/19 1250 10/31/19 1300  BP: 92/76 106/69  Pulse: 76 68  Resp: 20 21  Temp:    SpO2: 98% 98%    Last Pain:  Vitals:   10/31/19 1300  TempSrc:   PainSc: 0-No pain                 Lenard Simmer

## 2019-10-31 NOTE — Anesthesia Preprocedure Evaluation (Signed)
Anesthesia Evaluation  Patient identified by MRN, date of birth, ID band Patient awake    Reviewed: Allergy & Precautions, H&P , NPO status , Patient's Chart, lab work & pertinent test results, reviewed documented beta blocker date and time   History of Anesthesia Complications Negative for: history of anesthetic complications  Airway Mallampati: II  TM Distance: >3 FB Neck ROM: full    Dental  (+) Dental Advidsory Given, Missing, Teeth Intact Permanent bridge x2:   Pulmonary neg pulmonary ROS, former smoker,    Pulmonary exam normal breath sounds clear to auscultation       Cardiovascular Exercise Tolerance: Good hypertension, (-) angina+ CAD, + Past MI, + Cardiac Stents (stents failed and then went on to bypass), + CABG, + Peripheral Vascular Disease and +CHF  Normal cardiovascular exam(-) dysrhythmias (-) Valvular Problems/Murmurs Rhythm:regular Rate:Normal     Neuro/Psych negative neurological ROS  negative psych ROS   GI/Hepatic Neg liver ROS, GERD  ,  Endo/Other  diabetes  Renal/GU negative Renal ROS  negative genitourinary   Musculoskeletal   Abdominal   Peds  Hematology negative hematology ROS (+)   Anesthesia Other Findings Past Medical History: No date: Cardiomyopathy (HCC) No date: CHF (congestive heart failure) (HCC)     Comment:  Class I No date: Coronary artery disease No date: Diabetes mellitus without complication (HCC) No date: GERD (gastroesophageal reflux disease) No date: HFrEF (heart failure with reduced ejection fraction) (HCC) No date: Hyperlipidemia No date: Hypertension No date: MI (myocardial infarction) (HCC) No date: PAD (peripheral artery disease) (HCC)  Echo 01/2019: Left ventricular ejection fraction, by visual estimation, is 30 to 35%. The left ventricle has severely decreased function. There is no left ventricular hypertrophy.  Reproductive/Obstetrics negative OB ROS                              Anesthesia Physical Anesthesia Plan  ASA: III  Anesthesia Plan: General   Post-op Pain Management:    Induction: Intravenous  PONV Risk Score and Plan: 3 and Propofol infusion and TIVA  Airway Management Planned: Natural Airway and Nasal Cannula  Additional Equipment:   Intra-op Plan:   Post-operative Plan:   Informed Consent: I have reviewed the patients History and Physical, chart, labs and discussed the procedure including the risks, benefits and alternatives for the proposed anesthesia with the patient or authorized representative who has indicated his/her understanding and acceptance.     Dental Advisory Given  Plan Discussed with: Anesthesiologist, CRNA and Surgeon  Anesthesia Plan Comments:         Anesthesia Quick Evaluation

## 2019-10-31 NOTE — H&P (Signed)
Monique Bouillon, MD [address], [address], [place], [place], [address] [address], [place], [place], [address] Phone: [phone]  Fax: [phone]  Primary Care Physician:  Lorre Munroe, NP   Pre-Procedure History & Physical: HPI:  Monique Franklin is a 64 y.o. female is here for a colonoscopy.   Past Medical History:  Diagnosis Date  . Cardiomyopathy (HCC)   . CHF (congestive heart failure) (HCC)    Class I  . Coronary artery disease   . Diabetes mellitus without complication (HCC)   . GERD (gastroesophageal reflux disease)   . HFrEF (heart failure with reduced ejection fraction) (HCC)   . Hyperlipidemia   . Hypertension   . MI (myocardial infarction) (HCC)   . PAD (peripheral artery disease) (HCC)     Past Surgical History:  Procedure Laterality Date  . BREAST BIOPSY Bilateral 2016   neg  . CARDIAC CATHETERIZATION    . CARDIAC DEFIBRILLATOR PLACEMENT    . COMBINED AUGMENTATION MAMMAPLASTY AND ABDOMINOPLASTY    . CORONARY ARTERY BYPASS GRAFT  08/2017   X2 with LIMA to LAD; 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;Severe 3 vessel CAD-PCI to LAD and LC x(5stents) Coronary dissection of LM to LAD   . CORONARY ARTERY BYPASS GRAFT    . CORONARY STENT PLACEMENT    . PERCUTANEOUS CORONARY STENT INTERVENTION (PCI-S)    . REDUCTION MAMMAPLASTY Bilateral 2017  . TONSILLECTOMY      Prior to Admission medications   Medication Sig Start Date End Date Taking? Authorizing Provider  aspirin EC 81 MG tablet Take 81 mg by mouth daily.   Yes [provider]  carvedilol (COREG) 12.5 MG tablet Take 1.5 tablets (18.75 mg total) by mouth 2 (two) times daily with a meal. 07/15/19  Yes Clegg, Amy D, NP  carvedilol (COREG) 6.25 MG tablet TAKE 1 TABLET (6.25 MG TOTAL) BY MOUTH 2 (TWO) TIMES DAILY WITH A MEAL. 10/16/19  Yes Baity, Salvadore Oxford, NP  FARXIGA 10 MG TABS tablet TAKE 1 TABLET BY MOUTH EVERY DAY 07/14/19  Yes Baity, Salvadore Oxford, NP  glimepiride (AMARYL) 2 MG tablet Take 2 mg by  mouth daily with breakfast.   Yes [provider]  icosapent Ethyl (VASCEPA) 1 g capsule Take 2 capsules (2 g total) by mouth 2 (two) times daily. 08/26/19  Yes Laurey Morale, MD  metFORMIN (GLUCOPHAGE) 1000 MG tablet Take 500 mg by mouth 2 (two) times daily with a meal.   Yes [provider]  rosuvastatin (CRESTOR) 40 MG tablet Take 40 mg by mouth daily.   Yes [provider]  sacubitril-valsartan (ENTRESTO) 49-51 MG Take 1 tablet by mouth 2 (two) times daily. 10/20/19  Yes Laurey Morale, MD  spironolactone (ALDACTONE) 25 MG tablet Take 1 tablet (25 mg total) by mouth daily. 05/30/19  Yes Laurey Morale, MD  clobetasol ointment (TEMOVATE) 0.05 % Apply to affected area every night for 4 weeks, then every other day for 4 weeks and then twice a week for 4 weeks or until resolution. 10/02/19   Schuman, Jaquelyn Bitter, MD  furosemide (LASIX) 40 MG tablet Take 1 tablet (40 mg total) by mouth daily as needed for fluid or edema. Patient not taking: Reported on 10/31/2019 06/24/19   Laurey Morale, MD  lisinopril (ZESTRIL) 5 MG tablet Take by mouth. Patient not taking: Reported on 10/31/2019 12/06/18   [provider]    Allergies as of 10/09/2019 - Review Complete 10/09/2019  Allergen Reaction Noted  . Brilinta [  ticagrelor]  01/02/2019    Family History  Problem Relation Age of Onset  . Heart attack Mother   . COPD Father   . Thyroid cancer Sister   . Breast cancer Sister 65  . Diabetes Sister   . Diabetes Brother     Social History   Socioeconomic History  . Marital status: Widowed    Spouse name: Not on file  . Number of children: Not on file  . Years of education: Not on file  . Highest education level: Not on file  Occupational History  . Not on file  Tobacco Use  . Smoking status: Former Smoker    Packs/day: 1.00    Years: 33.00    Pack years: 33.00  . Smokeless tobacco: Never Used  Vaping Use  . Vaping Use: Never used  Substance and  Sexual Activity  . Alcohol use: Yes    Comment: occasional  . Drug use: Never  . Sexual activity: Not on file  Other Topics Concern  . Not on file  Social History Narrative  . Not on file   Social Determinants of Health   Financial Resource Strain:   . Difficulty of Paying Living Expenses:   Food Insecurity:   . Worried About Programme researcher, broadcasting/film/video in the Last Year:   . Barista in the Last Year:   Transportation Needs:   . Freight forwarder (Medical):   Marland Kitchen Lack of Transportation (Non-Medical):   Physical Activity:   . Days of Exercise per Week:   . Minutes of Exercise per Session:   Stress:   . Feeling of Stress :   Social Connections:   . Frequency of Communication with Friends and Family:   . Frequency of Social Gatherings with Friends and Family:   . Attends Religious Services:   . Active Member of Clubs or Organizations:   . Attends Banker Meetings:   Marland Kitchen Marital Status:   Intimate Partner Violence:   . Fear of Current or Ex-Partner:   . Emotionally Abused:   Marland Kitchen Physically Abused:   . Sexually Abused:     Review of Systems: See HPI, otherwise negative ROS  Physical Exam: BP 111/79   Pulse 93   Temp 97.7 F (36.5 C) (Temporal)   Resp 18   Ht 5\' 3"  (1.6 m)   Wt 83.6 kg   SpO2 98%   BMI 32.66 kg/m  General:   Alert,  pleasant and cooperative in NAD Head:  Normocephalic and atraumatic. Neck:  Supple; no masses or thyromegaly. Lungs:  Clear throughout to auscultation, normal respiratory effort.    Heart:  +S1, +S2, Regular rate and rhythm, No edema. Abdomen:  Soft, nontender and nondistended. Normal bowel sounds, without guarding, and without rebound.   Neurologic:  Alert and  oriented x4;  grossly normal neurologically.  Impression/Plan: Monique Franklin is here for a colonoscopy to be performed for average risk screening.  Risks, benefits, limitations, and alternatives regarding  colonoscopy have been reviewed with the patient.   Questions have been answered.  All parties agreeable.   Monique Nova, MD  10/31/2019, 11:47 AM

## 2019-10-31 NOTE — Transfer of Care (Signed)
Immediate Anesthesia Transfer of Care Note  Patient: Monique Franklin  Procedure(s) Performed: COLONOSCOPY WITH PROPOFOL (N/A )  Patient Location: PACU  Anesthesia Type:General  Level of Consciousness: sedated  Airway & Oxygen Therapy: Patient Spontanous Breathing and Patient connected to nasal cannula oxygen  Post-op Assessment: Report given to RN and Post -op Vital signs reviewed and stable  Post vital signs: Reviewed and stable  Last Vitals:  Vitals Value Taken Time  BP    Temp    Pulse    Resp    SpO2      Last Pain:  Vitals:   10/31/19 1120  TempSrc: Temporal  PainSc: 0-No pain         Complications: No complications documented.

## 2019-11-03 ENCOUNTER — Ambulatory Visit (HOSPITAL_COMMUNITY)
Admission: RE | Admit: 2019-11-03 | Discharge: 2019-11-03 | Disposition: A | Discharge status: Home / Self Care | Payer: 59 | Source: Ambulatory Visit | Attending: Cardiology | Admitting: Cardiology

## 2019-11-03 ENCOUNTER — Other Ambulatory Visit: Payer: Self-pay

## 2019-11-03 ENCOUNTER — Encounter: Payer: Self-pay | Admitting: Gastroenterology

## 2019-11-03 DIAGNOSIS — I509 Heart failure, unspecified: Secondary | ICD-10-CM | POA: Diagnosis present

## 2019-11-03 LAB — BASIC METABOLIC PANEL
Anion gap: 8 (ref 5–15)
BUN: 15 mg/dL (ref 8–23)
CO2: 24 mmol/L (ref 22–32)
Calcium: 10.2 mg/dL (ref 8.9–10.3)
Chloride: 106 mmol/L (ref 98–111)
Creatinine, Ser: 0.96 mg/dL (ref 0.44–1.00)
GFR calc Af Amer: >60 mL/min (ref >60)
GFR calc non Af Amer: >60 mL/min (ref >60)
Glucose, Bld: 207 mg/dL — ABNORMAL HIGH (ref 70–99)
Potassium: 4.5 mmol/L (ref 3.5–5.1)
Sodium: 138 mmol/L (ref 135–145)

## 2019-11-04 ENCOUNTER — Other Ambulatory Visit (HOSPITAL_COMMUNITY): Payer: Self-pay | Admitting: Adult Health

## 2019-11-20 ENCOUNTER — Telehealth: Payer: Self-pay

## 2019-11-20 NOTE — Telephone Encounter (Signed)
Pt's medication is already at pt's pharmacy with refills. Thanks

## 2019-11-20 NOTE — Telephone Encounter (Signed)
Please review for refill for Entresto 24/26 mg

## 2019-11-27 ENCOUNTER — Other Ambulatory Visit (HOSPITAL_COMMUNITY): Payer: Self-pay

## 2019-11-27 MED ORDER — ICOSAPENT ETHYL 1 G PO CAPS: 2.0000 g | ORAL_CAPSULE | Freq: Two times a day (BID) | ORAL | 5 refills | Status: DC

## 2019-12-01 ENCOUNTER — Other Ambulatory Visit (HOSPITAL_COMMUNITY): Payer: Self-pay | Admitting: *Deleted

## 2019-12-01 ENCOUNTER — Other Ambulatory Visit: Payer: Self-pay

## 2019-12-01 ENCOUNTER — Telehealth (HOSPITAL_COMMUNITY): Payer: Self-pay | Admitting: Cardiology

## 2019-12-01 MED ORDER — ENTRESTO 49-51 MG PO TABS: 1.0000 | ORAL_TABLET | Freq: Two times a day (BID) | ORAL | 6 refills | Status: DC

## 2019-12-01 MED ORDER — ICOSAPENT ETHYL 1 G PO CAPS: 2.0000 g | ORAL_CAPSULE | Freq: Two times a day (BID) | ORAL | 5 refills | Status: DC

## 2019-12-01 NOTE — Telephone Encounter (Signed)
Patient came into office she needs a refill for Entresto, and Vascepa (completely out) please send scripts to [facility] pharmacy, pt can be reached @ [phone]. Thanks

## 2019-12-08 ENCOUNTER — Other Ambulatory Visit (HOSPITAL_COMMUNITY): Payer: Self-pay | Admitting: *Deleted

## 2019-12-08 MED ORDER — ICOSAPENT ETHYL 1 G PO CAPS: 2.0000 g | ORAL_CAPSULE | Freq: Two times a day (BID) | ORAL | 3 refills | Status: DC

## 2019-12-22 ENCOUNTER — Other Ambulatory Visit: Payer: Self-pay

## 2019-12-24 ENCOUNTER — Other Ambulatory Visit: Payer: Self-pay

## 2019-12-24 MED ORDER — ENTRESTO 49-51 MG PO TABS: 1.0000 | ORAL_TABLET | Freq: Two times a day (BID) | ORAL | 0 refills | Status: DC

## 2019-12-25 ENCOUNTER — Encounter: Payer: Self-pay | Admitting: Internal Medicine

## 2019-12-26 MED ORDER — ROSUVASTATIN CALCIUM 40 MG PO TABS: 40.0000 mg | ORAL_TABLET | Freq: Every day | ORAL | 0 refills | Status: DC

## 2020-01-02 ENCOUNTER — Encounter: Payer: Self-pay | Admitting: Obstetrics and Gynecology

## 2020-01-02 ENCOUNTER — Other Ambulatory Visit: Payer: Self-pay

## 2020-01-02 ENCOUNTER — Encounter: Payer: Self-pay | Admitting: Cardiology

## 2020-01-02 ENCOUNTER — Ambulatory Visit (INDEPENDENT_AMBULATORY_CARE_PROVIDER_SITE_OTHER): Payer: 59 | Admitting: Cardiology

## 2020-01-02 ENCOUNTER — Ambulatory Visit (INDEPENDENT_AMBULATORY_CARE_PROVIDER_SITE_OTHER): Payer: 59 | Admitting: Obstetrics and Gynecology

## 2020-01-02 VITALS — BP 100/60 | Ht 63.0 in | Wt 190.0 lb

## 2020-01-02 VITALS — BP 102/60 | HR 89 | Ht 63.0 in | Wt 189.4 lb

## 2020-01-02 DIAGNOSIS — Z6833 Body mass index (BMI) 33.0-33.9, adult: Secondary | ICD-10-CM

## 2020-01-02 DIAGNOSIS — I255 Ischemic cardiomyopathy: Secondary | ICD-10-CM | POA: Diagnosis not present

## 2020-01-02 DIAGNOSIS — N904 Leukoplakia of vulva: Secondary | ICD-10-CM | POA: Diagnosis not present

## 2020-01-02 DIAGNOSIS — Z95810 Presence of automatic (implantable) cardiac defibrillator: Secondary | ICD-10-CM

## 2020-01-02 DIAGNOSIS — I2510 Atherosclerotic heart disease of native coronary artery without angina pectoris: Secondary | ICD-10-CM

## 2020-01-02 NOTE — Progress Notes (Signed)
Patient ID: Monique Franklin, female   DOB: 03-28-1956, 64 y.o.   MRN: [id number]  Reason for Consult: Follow-up (no concerns)   Referred by Lorre Munroe, NP  Subjective:     HPI:  Monique Franklin is a 64 y.o. female she was seen previously for lichen sclerosis and started on clobetasol.  She reports that she has had good response to this therapy she is currently using it 1-2 times a week.   Past Medical History:  Diagnosis Date  . Cardiomyopathy (HCC)   . CHF (congestive heart failure) (HCC)    Class I  . Coronary artery disease   . Diabetes mellitus without complication (HCC)   . GERD (gastroesophageal reflux disease)   . HFrEF (heart failure with reduced ejection fraction) (HCC)   . Hyperlipidemia   . Hypertension   . MI (myocardial infarction) (HCC)   . PAD (peripheral artery disease) (HCC)    Family History  Problem Relation Age of Onset  . Heart attack Mother   . COPD Father   . Thyroid cancer Sister   . Breast cancer Sister 67  . Diabetes Sister   . Diabetes Brother    Past Surgical History:  Procedure Laterality Date  . BREAST BIOPSY Bilateral 2016   neg  . CARDIAC CATHETERIZATION    . CARDIAC DEFIBRILLATOR PLACEMENT    . COLONOSCOPY WITH PROPOFOL N/A 10/31/2019   Procedure: COLONOSCOPY WITH PROPOFOL;  Surgeon: Pasty Spillers, MD;  Location: [facility] ENDOSCOPY;  Service: Endoscopy;  Laterality: N/A;  priority 4  . COMBINED AUGMENTATION MAMMAPLASTY AND ABDOMINOPLASTY    . CORONARY ARTERY BYPASS GRAFT  08/2017   X2 with LIMA to LAD; 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;Severe 3 vessel CAD-PCI to LAD and LC x(5stents) Coronary dissection of LM to LAD   . CORONARY ARTERY BYPASS GRAFT    . CORONARY STENT PLACEMENT    . PERCUTANEOUS CORONARY STENT INTERVENTION (PCI-S)    . REDUCTION MAMMAPLASTY Bilateral 2017  . TONSILLECTOMY      Short Social History:  Social History   Tobacco Use  . Smoking status: Former Smoker    Packs/day: 1.00    Years: 33.00    Pack  years: 33.00  . Smokeless tobacco: Never Used  Substance Use Topics  . Alcohol use: Yes    Comment: occasional    Allergies  Allergen Reactions  . Ticagrelor Rash    Current Outpatient Medications  Medication Sig Dispense Refill  . aspirin EC 81 MG tablet Take 81 mg by mouth daily.    . carvedilol (COREG) 12.5 MG tablet TAKE 1.5 TABLETS (18.75 MG TOTAL) BY MOUTH 2 (TWO) TIMES DAILY WITH A MEAL. 270 tablet 1  . carvedilol (COREG) 6.25 MG tablet TAKE 1 TABLET (6.25 MG TOTAL) BY MOUTH 2 (TWO) TIMES DAILY WITH A MEAL. 60 tablet 1  . clobetasol ointment (TEMOVATE) 0.05 % Apply to affected area every night for 4 weeks, then every other day for 4 weeks and then twice a week for 4 weeks or until resolution. 30 g 5  . FARXIGA 10 MG TABS tablet TAKE 1 TABLET BY MOUTH EVERY DAY 90 tablet 0  . furosemide (LASIX) 40 MG tablet Take 1 tablet (40 mg total) by mouth daily as needed for fluid or edema. 90 tablet 0  . glimepiride (AMARYL) 2 MG tablet Take 2 mg by mouth daily with breakfast.    . icosapent Ethyl (VASCEPA) 1 g capsule Take 2 capsules (2 g total) by mouth 2 (two) times daily.  360 capsule 3  . metFORMIN (GLUCOPHAGE) 1000 MG tablet Take 500 mg by mouth 2 (two) times daily with a meal.    . rosuvastatin (CRESTOR) 40 MG tablet Take 1 tablet (40 mg total) by mouth daily. 90 tablet 0  . sacubitril-valsartan (ENTRESTO) 49-51 MG Take 1 tablet by mouth 2 (two) times daily. 180 tablet 0  . spironolactone (ALDACTONE) 25 MG tablet Take 1 tablet (25 mg total) by mouth daily. 90 tablet 3   No current facility-administered medications for this visit.    Review of Systems  Constitutional: Negative for chills, fatigue, fever and unexpected weight change.  HENT: Negative for trouble swallowing.  Eyes: Negative for loss of vision.  Respiratory: Negative for cough, shortness of breath and wheezing.  Cardiovascular: Negative for chest pain, leg swelling, palpitations and syncope.  GI: Negative for  abdominal pain, blood in stool, diarrhea, nausea and vomiting.  GU: Negative for difficulty urinating, dysuria, frequency and hematuria.  Musculoskeletal: Negative for back pain, leg pain and joint pain.  Skin: Negative for rash.  Neurological: Negative for dizziness, headaches, light-headedness, numbness and seizures.  Psychiatric: Negative for behavioral problem, confusion, depressed mood and sleep disturbance.        Objective:  Objective   Vitals:   01/02/20 1112  BP: 100/60  Weight: 190 lb (86.2 kg)  Height: 5\' 3"  (1.6 m)   Body mass index is 33.66 kg/m.  Physical Exam Vitals and nursing note reviewed. Exam conducted with a chaperone present.  Constitutional:      Appearance: She is well-developed.  HENT:     Head: Normocephalic and atraumatic.  Eyes:     Pupils: Pupils are equal, round, and reactive to light.  Cardiovascular:     Rate and Rhythm: Normal rate and regular rhythm.  Pulmonary:     Effort: Pulmonary effort is normal. No respiratory distress.  Genitourinary:   Skin:    General: Skin is warm and dry.  Neurological:     Mental Status: She is alert and oriented to person, place, and time.  Psychiatric:        Behavior: Behavior normal.        Thought Content: Thought content normal.        Judgment: Judgment normal.         Assessment/Plan:     64 year old with lichen sclerosis.  Patient has been using clobetasol ointment.  She notes good response to therapy.  She no longer has any urge to itch.  She followed up with urogynecology who recommended estrogen ointment.  She has not been taking this estrogen ointment.  She does not desire treatment for her urinary frequency at this time.  She feels like she is on too many other medications.  Recommended continuing with clobetasol ointment 2-3 times a week for the next 3 months.  At that point patient should reassess her bottom if there is been improvement in the coloration of the skin would recommend  decreasing to 1-2 times a week.  We will follow up in 6 months with patient.  Discussed importance of yearly surveillance for patients with lichen sclerosis nor at increased risk of vulvar cancer.  Patient is overall doing well and happy with her response to treatment.  More than 25 minutes were spent face to face with the patient in the room, reviewing the medical record, labs and images, and coordinating care for the patient. The plan of management was discussed in detail and counseling was provided.  Adelene Idler MD [facility], [facility] 01/02/2020 11:58 AM

## 2020-01-02 NOTE — Patient Instructions (Signed)
Medication Instructions:   1. Your physician recommends that you continue on your current medications as directed. Please refer to the Current Medication list given to you today.   *If you need a refill on your cardiac medications before your next appointment, please call your pharmacy*   Lab Work:  1. None ordered  If you have labs (blood work) drawn today and your tests are completely normal, you will receive your results only by:  MyChart Message (if you have MyChart) OR  A paper copy in the mail If you have any lab test that is abnormal or we need to change your treatment, we will call you to review the results.   Testing/Procedures:  1. None Ordered   Follow-Up: At [facility], you and your health needs are our priority.  As part of our continuing mission to provide you with exceptional heart care, we have created designated Provider Care Teams.  These Care Teams include your primary Cardiologist (physician) and Advanced Practice Providers (APPs -  Physician Assistants and Nurse Practitioners) who all work together to provide you with the care you need, when you need it.  We recommend signing up for the patient portal called "MyChart".  Sign up information is provided on this After Visit Summary.  MyChart is used to connect with patients for Virtual Visits (Telemedicine).  Patients are able to view lab/test results, encounter notes, upcoming appointments, etc.  Non-urgent messages can be sent to your provider as well.   To learn more about what you can do with MyChart, go to [email or web address].    Your next appointment:   6 month(s)  The format for your next appointment:   In Person  Provider:   You may see Debbe Odea, MD or one of the following Advanced Practice Providers on your designated Care Team:    Nicolasa Ducking, NP  Eula Listen, PA-C  Marisue Ivan, PA-C  Cadence Dennis, [place]

## 2020-01-02 NOTE — Progress Notes (Signed)
Cardiology Office Note:    Date:  01/02/2020   ID:  Monique Franklin, DOB 1955/09/07, MRN [id number]  PCP:  Lorre Munroe, NP  Cardiologist:  Debbe Odea, MD  Electrophysiologist:  None   Referring MD: Lorre Munroe, NP   Chief Complaint  Patient presents with  . other    6 month follow up. Meds reviewed by the pt. verbally. Pt. c/o shortness of breath.     History of Present Illness:    Monique Franklin is a 64 y.o. female with a hx of hypertension, hyperlipidemia, former smoker, CAD, PCI to LAD and LCx(5 stents total), status post CABG x2 (LIMA to LAD, SVG to OM 2019), HFrEF EF 31%, s/p ICD 2020([employer] device), PAD(left femoral thrombo-endarterectomy with patch angioplasty 12/2017) who presents for follow-up.  Patient being seen for cardiac comorbidities.  She has some shortness of breath with exertion which she attributes to deconditioning.  She has gained about 15 pounds over the past year, which she attributes to not eating well.  States eating a lot of junk food and is working on stopping.  Denies edema, has not used Lasix.  She otherwise is tolerating all her medications without any adverse effects.  Blood pressure is usually in the low 100s systolic.  Prior notes Patient was originally seen to establish care.  She used to be followed at [facility] and later moved into the area.  After her CABG in 2019, she had a left femoral artery complication requiring left femoral patch angioplasty.  Earlier in 2020, she had an ICD placed due to low EF with ejection fraction of 31%.  She is a former smoker for about 20 years.   Past Medical History:  Diagnosis Date  . Cardiomyopathy (HCC)   . CHF (congestive heart failure) (HCC)    Class I  . Coronary artery disease   . Diabetes mellitus without complication (HCC)   . GERD (gastroesophageal reflux disease)   . HFrEF (heart failure with reduced ejection fraction) (HCC)   . Hyperlipidemia   . Hypertension   . MI  (myocardial infarction) (HCC)   . PAD (peripheral artery disease) (HCC)     Past Surgical History:  Procedure Laterality Date  . BREAST BIOPSY Bilateral 2016   neg  . CARDIAC CATHETERIZATION    . CARDIAC DEFIBRILLATOR PLACEMENT    . COLONOSCOPY WITH PROPOFOL N/A 10/31/2019   Procedure: COLONOSCOPY WITH PROPOFOL;  Surgeon: Pasty Spillers, MD;  Location: [facility] ENDOSCOPY;  Service: Endoscopy;  Laterality: N/A;  priority 4  . COMBINED AUGMENTATION MAMMAPLASTY AND ABDOMINOPLASTY    . CORONARY ARTERY BYPASS GRAFT  08/2017   X2 with LIMA to LAD; 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;Severe 3 vessel CAD-PCI to LAD and LC x(5stents) Coronary dissection of LM to LAD   . CORONARY ARTERY BYPASS GRAFT    . CORONARY STENT PLACEMENT    . PERCUTANEOUS CORONARY STENT INTERVENTION (PCI-S)    . REDUCTION MAMMAPLASTY Bilateral 2017  . TONSILLECTOMY      Current Medications: Current Meds  Medication Sig  . aspirin EC 81 MG tablet Take 81 mg by mouth daily.  . carvedilol (COREG) 12.5 MG tablet TAKE 1.5 TABLETS (18.75 MG TOTAL) BY MOUTH 2 (TWO) TIMES DAILY WITH A MEAL.  . carvedilol (COREG) 6.25 MG tablet TAKE 1 TABLET (6.25 MG TOTAL) BY MOUTH 2 (TWO) TIMES DAILY WITH A MEAL.  . clobetasol ointment (TEMOVATE) 0.05 % Apply to affected area every night for 4 weeks, then every other day for 4  weeks and then twice a week for 4 weeks or until resolution.  Marland Kitchen FARXIGA 10 MG TABS tablet TAKE 1 TABLET BY MOUTH EVERY DAY  . furosemide (LASIX) 40 MG tablet Take 1 tablet (40 mg total) by mouth daily as needed for fluid or edema.  Marland Kitchen glimepiride (AMARYL) 2 MG tablet Take 2 mg by mouth daily with breakfast.  . icosapent Ethyl (VASCEPA) 1 g capsule Take 2 capsules (2 g total) by mouth 2 (two) times daily.  . metFORMIN (GLUCOPHAGE) 1000 MG tablet Take 500 mg by mouth 2 (two) times daily with a meal.  . rosuvastatin (CRESTOR) 40 MG tablet Take 1 tablet (40 mg total) by mouth daily.  . sacubitril-valsartan (ENTRESTO) 49-51 MG  Take 1 tablet by mouth 2 (two) times daily.  Marland Kitchen spironolactone (ALDACTONE) 25 MG tablet Take 1 tablet (25 mg total) by mouth daily.     Allergies:   Ticagrelor   Social History   Socioeconomic History  . Marital status: Widowed    Spouse name: Not on file  . Number of children: Not on file  . Years of education: Not on file  . Highest education level: Not on file  Occupational History  . Not on file  Tobacco Use  . Smoking status: Former Smoker    Packs/day: 1.00    Years: 33.00    Pack years: 33.00  . Smokeless tobacco: Never Used  Vaping Use  . Vaping Use: Never used  Substance and Sexual Activity  . Alcohol use: Yes    Comment: occasional  . Drug use: Never  . Sexual activity: Not Currently    Birth control/protection: Post-menopausal  Other Topics Concern  . Not on file  Social History Narrative  . Not on file   Social Determinants of Health   Financial Resource Strain:   . Difficulty of Paying Living Expenses: Not on file  Food Insecurity:   . Worried About Programme researcher, broadcasting/film/video in the Last Year: Not on file  . Ran Out of Food in the Last Year: Not on file  Transportation Needs:   . Lack of Transportation (Medical): Not on file  . Lack of Transportation (Non-Medical): Not on file  Physical Activity:   . Days of Exercise per Week: Not on file  . Minutes of Exercise per Session: Not on file  Stress:   . Feeling of Stress : Not on file  Social Connections:   . Frequency of Communication with Friends and Family: Not on file  . Frequency of Social Gatherings with Friends and Family: Not on file  . Attends Religious Services: Not on file  . Active Member of Clubs or Organizations: Not on file  . Attends Banker Meetings: Not on file  . Marital Status: Not on file     Family History: The patient's family history includes Breast cancer (age of onset: 50) in her sister; COPD in her father; Diabetes in her brother and sister; Heart attack in her  mother; Thyroid cancer in her sister.  ROS:   Please see the history of present illness.     All other systems reviewed and are negative.  EKGs/Labs/Other Studies Reviewed:    The following studies were reviewed today: TTE 2019/03/03 1. Left ventricular ejection fraction, by visual estimation, is 30 to 35%. The left ventricle has severely decreased function. There is no left ventricular hypertrophy.  2. Left ventricular diastolic parameters are consistent with Grade I diastolic dysfunction (impaired relaxation).  3. Mildly dilated  left ventricular internal cavity size.  4. The left ventricle demonstrates global hypokinesis.  5. Global right ventricle has normal systolic function.The right ventricular size is normal. No increase in right ventricular wall thickness.  6. Left atrial size was normal.  7. Mild to moderate mitral valve regurgitation.  8. TR signal is inadequate for assessing pulmonary artery systolic pressure.  9. A pacer wire is visualized in the RV.  EKG:  EKG is  ordered today.  The ekg ordered today demonstrates normal sinus rhythm  Recent Labs: 01/22/2019: ALT 13; Hemoglobin 12.2; Platelets 247.0 11/03/2019: BUN 15; Creatinine, Ser 0.96; Potassium 4.5; Sodium 138  Recent Lipid Panel    Component Value Date/Time   CHOL 156 10/20/2019 1223   TRIG 215 (H) 10/20/2019 1223   HDL 57 10/20/2019 1223   CHOLHDL 2.7 10/20/2019 1223   VLDL 43 (H) 10/20/2019 1223   LDLCALC 56 10/20/2019 1223   LDLDIRECT 68.0 01/22/2019 0759    Physical Exam:    VS:  BP 102/60 (BP Location: Right Arm, Patient Position: Sitting, Cuff Size: Normal)   Pulse 89   Ht 5\' 3"  (1.6 m)   Wt 189 lb 6 oz (85.9 kg)   SpO2 99%   BMI 33.55 kg/m     Wt Readings from Last 3 Encounters:  01/02/20 190 lb (86.2 kg)  01/02/20 189 lb 6 oz (85.9 kg)  10/31/19 184 lb 5.9 oz (83.6 kg)     GEN:  Well nourished, well developed in no acute distress HEENT: Normal NECK: No JVD; No carotid  bruits LYMPHATICS: No lymphadenopathy CARDIAC: RRR, no murmurs, rubs, gallops RESPIRATORY:  Clear to auscultation without rales, wheezing or rhonchi  ABDOMEN: Soft, non-tender, non-distended MUSCULOSKELETAL:  No edema; No deformity  SKIN: Warm and dry NEUROLOGIC:  Alert and oriented x 3 PSYCHIATRIC:  Normal affect   ASSESSMENT:    1. Ischemic cardiomyopathy   2. Coronary artery disease involving native coronary artery of native heart without angina pectoris   3. ICD (implantable cardioverter-defibrillator) in place   4. BMI 33.0-33.9,adult    PLAN:    In order of problems listed above:  1. Patient with history of ischemic cardiomyopathy EF 30 to 35%, status post ICD.  NYHA class II symptoms.  Blood pressure is low normal preventing additional titration of CHF meds.  Continue Entresto 45-51 mg twice daily.  Farxiga 10 mg daily.  Continue Coreg 18.75mg  twice daily, spironolactone 25 mg daily, Lasix 40 as needed.  Patient counseled to check weights daily and take Lasix if she develops weight gain of over 3 pounds in a day or 5 pounds in a week.  Get repeat echocardiogram next month.  Keep follow-up appointment with heart failure team. 2. History of CAD, PCI's, CABG x2.  No current symptoms of angina.  Continue aspirin 81 mg, Crestor 40 mg daily. 3. ICD in place for ischemic cardiomyopathy.  Keep device checks appointment with EP. 4. Deconditioning, overweight.  Low calorie diet, exercises as tolerated encouraged.  Follow-up in 6 months  Total encounter time 40 minutes  Greater than 50% was spent in counseling and coordination of care with the patient   Medication Adjustments/Labs and Tests Ordered: Current medicines are reviewed at length with the patient today.  Concerns regarding medicines are outlined above.  Orders Placed This Encounter  Procedures  . EKG 12-Lead   No orders of the defined types were placed in this encounter.   Patient Instructions  Medication Instructions:    1. Your physician recommends  that you continue on your current medications as directed. Please refer to the Current Medication list given to you today.   *If you need a refill on your cardiac medications before your next appointment, please call your pharmacy*   Lab Work:  1. None ordered  If you have labs (blood work) drawn today and your tests are completely normal, you will receive your results only by: Marland Kitchen MyChart Message (if you have MyChart) OR . A paper copy in the mail If you have any lab test that is abnormal or we need to change your treatment, we will call you to review the results.   Testing/Procedures:  1. None Ordered   Follow-Up: At [facility], you and your health needs are our priority.  As part of our continuing mission to provide you with exceptional heart care, we have created designated Provider Care Teams.  These Care Teams include your primary Cardiologist (physician) and Advanced Practice Providers (APPs -  Physician Assistants and Nurse Practitioners) who all work together to provide you with the care you need, when you need it.  We recommend signing up for the patient portal called "MyChart".  Sign up information is provided on this After Visit Summary.  MyChart is used to connect with patients for Virtual Visits (Telemedicine).  Patients are able to view lab/test results, encounter notes, upcoming appointments, etc.  Non-urgent messages can be sent to your provider as well.   To learn more about what you can do with MyChart, go to [email or web address].    Your next appointment:   6 month(s)  The format for your next appointment:   In Person  Provider:   You may see Debbe Odea, MD or one of the following Advanced Practice Providers on your designated Care Team:    Nicolasa Ducking, NP  Eula Listen, PA-C  Marisue Ivan, PA-C  Cadence Century, [place]       Signed, Debbe Odea, MD  01/02/2020 12:35 PM    [facility]

## 2020-01-20 ENCOUNTER — Other Ambulatory Visit (HOSPITAL_COMMUNITY): Payer: Self-pay

## 2020-01-20 ENCOUNTER — Telehealth: Payer: Self-pay | Admitting: Cardiology

## 2020-01-20 MED ORDER — ENTRESTO 49-51 MG PO TABS: 1.0000 | ORAL_TABLET | Freq: Two times a day (BID) | ORAL | 0 refills | Status: DC

## 2020-01-20 NOTE — Telephone Encounter (Signed)
*  STAT* If patient is at the pharmacy, call can be transferred to refill team.   1. Which medications need to be refilled? (please list name of each medication and dose if known)   entresto 49-51 mg po BID  2. Which pharmacy/location (including street and city if local pharmacy) is medication to be sent to?  [facility]   3. Do they need a 30 day or 90 day supply? 90

## 2020-01-20 NOTE — Telephone Encounter (Signed)
Prescribing Provider Encounter Provider  Laurey Morale, MD Annabell Sabal, CMA  Outpatient Medication Detail   Disp Refills Start End   sacubitril-valsartan (ENTRESTO) 49-51 MG 180 tablet 0 01/20/2020    Sig - Route: Take 1 tablet by mouth 2 (two) times daily. - Oral   Sent to pharmacy as: sacubitril-valsartan (ENTRESTO) 49-51 MG   E-Prescribing Status: Receipt confirmed by pharmacy (01/20/2020  8:58 AM EDT)

## 2020-01-21 ENCOUNTER — Other Ambulatory Visit (HOSPITAL_COMMUNITY): Payer: Self-pay | Admitting: *Deleted

## 2020-01-21 MED ORDER — ENTRESTO 49-51 MG PO TABS: 1.0000 | ORAL_TABLET | Freq: Two times a day (BID) | ORAL | 0 refills | Status: DC

## 2020-01-26 ENCOUNTER — Ambulatory Visit (INDEPENDENT_AMBULATORY_CARE_PROVIDER_SITE_OTHER): Payer: 59

## 2020-01-26 DIAGNOSIS — I429 Cardiomyopathy, unspecified: Secondary | ICD-10-CM | POA: Diagnosis not present

## 2020-01-27 LAB — CUP PACEART REMOTE DEVICE CHECK
Battery Remaining Longevity: 84 mo
Battery Remaining Percentage: 82 %
Battery Voltage: 2.99 V
Brady Statistic RV Percent Paced: 1 %
Date Time Interrogation Session: 20211018033212
HighPow Impedance: 80 Ohm
HighPow Impedance: 80 Ohm
Implantable Lead Implant Date: 20200707
Implantable Lead Location: 753860
Implantable Pulse Generator Implant Date: 20200707
Lead Channel Impedance Value: 680 Ohm
Lead Channel Pacing Threshold Amplitude: 0.5 V
Lead Channel Pacing Threshold Pulse Width: 0.5 ms
Lead Channel Sensing Intrinsic Amplitude: 11.7 mV
Lead Channel Setting Pacing Amplitude: 2.5 V
Lead Channel Setting Pacing Pulse Width: 0.5 ms
Lead Channel Setting Sensing Sensitivity: 0.5 mV
Pulse Gen Serial Number: 9816687

## 2020-01-29 ENCOUNTER — Ambulatory Visit (HOSPITAL_COMMUNITY)
Admission: RE | Admit: 2020-01-29 | Discharge: 2020-01-29 | Disposition: A | Discharge status: Home / Self Care | Payer: 59 | Source: Ambulatory Visit | Attending: Cardiology | Admitting: Cardiology

## 2020-01-29 ENCOUNTER — Encounter (HOSPITAL_COMMUNITY): Payer: Self-pay | Admitting: Cardiology

## 2020-01-29 ENCOUNTER — Ambulatory Visit (HOSPITAL_BASED_OUTPATIENT_CLINIC_OR_DEPARTMENT_OTHER)
Admission: RE | Admit: 2020-01-29 | Discharge: 2020-01-29 | Disposition: A | Discharge status: Home / Self Care | Payer: 59 | Source: Ambulatory Visit | Attending: Cardiology | Admitting: Cardiology

## 2020-01-29 ENCOUNTER — Other Ambulatory Visit: Payer: Self-pay

## 2020-01-29 VITALS — BP 120/84 | HR 70 | Wt 191.2 lb

## 2020-01-29 DIAGNOSIS — I255 Ischemic cardiomyopathy: Secondary | ICD-10-CM | POA: Diagnosis not present

## 2020-01-29 DIAGNOSIS — Z7982 Long term (current) use of aspirin: Secondary | ICD-10-CM | POA: Insufficient documentation

## 2020-01-29 DIAGNOSIS — Z95810 Presence of automatic (implantable) cardiac defibrillator: Secondary | ICD-10-CM | POA: Diagnosis not present

## 2020-01-29 DIAGNOSIS — I429 Cardiomyopathy, unspecified: Secondary | ICD-10-CM | POA: Diagnosis not present

## 2020-01-29 DIAGNOSIS — E785 Hyperlipidemia, unspecified: Secondary | ICD-10-CM | POA: Diagnosis not present

## 2020-01-29 DIAGNOSIS — I2510 Atherosclerotic heart disease of native coronary artery without angina pectoris: Secondary | ICD-10-CM | POA: Diagnosis not present

## 2020-01-29 DIAGNOSIS — Z7984 Long term (current) use of oral hypoglycemic drugs: Secondary | ICD-10-CM | POA: Insufficient documentation

## 2020-01-29 DIAGNOSIS — I252 Old myocardial infarction: Secondary | ICD-10-CM | POA: Diagnosis not present

## 2020-01-29 DIAGNOSIS — I110 Hypertensive heart disease with heart failure: Secondary | ICD-10-CM | POA: Insufficient documentation

## 2020-01-29 DIAGNOSIS — Z8249 Family history of ischemic heart disease and other diseases of the circulatory system: Secondary | ICD-10-CM | POA: Insufficient documentation

## 2020-01-29 DIAGNOSIS — Z951 Presence of aortocoronary bypass graft: Secondary | ICD-10-CM | POA: Diagnosis not present

## 2020-01-29 DIAGNOSIS — E119 Type 2 diabetes mellitus without complications: Secondary | ICD-10-CM | POA: Diagnosis not present

## 2020-01-29 DIAGNOSIS — I5022 Chronic systolic (congestive) heart failure: Secondary | ICD-10-CM

## 2020-01-29 DIAGNOSIS — I509 Heart failure, unspecified: Secondary | ICD-10-CM

## 2020-01-29 DIAGNOSIS — Z79899 Other long term (current) drug therapy: Secondary | ICD-10-CM | POA: Diagnosis not present

## 2020-01-29 DIAGNOSIS — Z955 Presence of coronary angioplasty implant and graft: Secondary | ICD-10-CM | POA: Diagnosis not present

## 2020-01-29 LAB — BASIC METABOLIC PANEL
Anion gap: 12 (ref 5–15)
BUN: 22 mg/dL (ref 8–23)
CO2: 21 mmol/L — ABNORMAL LOW (ref 22–32)
Calcium: 9.8 mg/dL (ref 8.9–10.3)
Chloride: 104 mmol/L (ref 98–111)
Creatinine, Ser: 1.02 mg/dL — ABNORMAL HIGH (ref 0.44–1.00)
GFR, Estimated: >60 mL/min (ref >60)
Glucose, Bld: 210 mg/dL — ABNORMAL HIGH (ref 70–99)
Potassium: 4.5 mmol/L (ref 3.5–5.1)
Sodium: 137 mmol/L (ref 135–145)

## 2020-01-29 LAB — ECHOCARDIOGRAM COMPLETE
Area-P 1/2: 3.37 cm2
Calc EF: 35.5 %
S' Lateral: 3.6 cm
Single Plane A2C EF: 23.4 %
Single Plane A4C EF: 38.5 %

## 2020-01-29 MED ORDER — PERFLUTREN LIPID MICROSPHERE
1.0000 mL | INTRAVENOUS | Status: DC | PRN
  Administered 2020-01-29: 2 mL via INTRAVENOUS
  Filled 2020-01-29: qty 10

## 2020-01-29 MED ORDER — CARVEDILOL 25 MG PO TABS
25.0000 mg | ORAL_TABLET | Freq: Two times a day (BID) | ORAL | 3 refills | Status: DC
Start: 2020-01-29 — End: 2020-10-25

## 2020-01-29 NOTE — Progress Notes (Signed)
  Echocardiogram 2D Echocardiogram with definity has been performed.  Leta Jungling M 01/29/2020, 9:57 AM

## 2020-01-29 NOTE — Patient Instructions (Signed)
Increase Carvedilol to 25 mg Twice daily   Labs done today, your results will be available in MyChart, we will contact you for abnormal readings.  Your physician recommends that you schedule a follow-up appointment in: 3-4 months  If you have any questions or concerns before your next appointment please send [place] a message through [place] or call our office at [phone].    TO LEAVE A MESSAGE FOR THE NURSE SELECT OPTION 2, PLEASE LEAVE A MESSAGE INCLUDING: . YOUR NAME . DATE OF BIRTH . CALL BACK NUMBER . REASON FOR CALL**this is important as we prioritize the call backs  YOU WILL RECEIVE A CALL BACK THE SAME DAY AS LONG AS YOU CALL BEFORE 4:00 PM  At [facility], you and your health needs are our priority. As part of our continuing mission to provide you with exceptional heart care, we have created designated Provider Care Teams. These Care Teams include your primary Cardiologist (physician) and Advanced Practice Providers (APPs- Physician Assistants and Nurse Practitioners) who all work together to provide you with the care you need, when you need it.   You may see any of the following providers on your designated Care Team at your next follow up: Marland Kitchen Dr Arvilla Meres . Dr Marca Ancona . Tonye Becket, NP . Robbie Lis, PA . Karle Plumber, PharmD   Please be sure to bring in all your medications bottles to every appointment.

## 2020-01-30 NOTE — Progress Notes (Signed)
PCP: Lorre Munroe, NP  Cardiology: Dr. Azucena Cecil HF Cardiology: Dr. Shirlee Latch  64 y.o. with CAD and ischemic cardiomyopathy was referred by Dr. Azucena Cecil for evaluation of CHF.  She lived in [place] in the past and had her initial cardiology care there.  In 4/19, she had PCI to LAD and LCx with 5 stents. She then had an MI in 5/19 and had CABG with LIMA-LAD and SVG-OM.  She developed an ischemic cardiomyopathy and had a St Jude ICD placed.  She moved to [facility] and has been followed there more recently.  Echo in 10/20 showed EF 30-35%, mild LV dilation, mild-moderate MR.  Echo was done today and reviewed, EF 35-40%.      She returns for followup of CHF.  She is short of breath only if she walks a long distance.  No orthopnea/PND.  No chest pain.  She has not had to use Lasix.  Weight fairly stable.    Labs (1/21): K 4.5, creatinine 1.09, LDL 54, TGs 196 Labs (4/21): K 4.2, creatinine 0.94 Labs (7/21): K 4.5, creatinine 0.96, LDL 56, HDl 57  PMH: 1. HTN 2. Type 2 diabetes 3. CAD: PCI to LAD and LCx in 4/19, 5 stents altogether.  - MI in 5/19 with CABG (LIMA-LAD, SVG-OM).  4. Hyperlipidemia 5. Chronic systolic CHF: Ischemic cardiomyopathy.   - St Jude ICD.  - Echo (10/20): EF 30-35%, mildly dilated LV, normal RV, mild to moderate MR.  - Echo (10/21): EF 35-40%, diffuse hypokinesis, normal RV 6. Left femoral thrombendarterectomy with patch angioplasty in 9/19.  Complication of Impella.   FH: Mother with CABG in 70s, MI in 31s.   SH: Widowed, lives in [place], nonsmoker. She has children locally.  Not working.   ROS: All systems reviewed and negative except as per HPI.   Current Outpatient Medications  Medication Sig Dispense Refill  . aspirin EC 81 MG tablet Take 81 mg by mouth daily.    . clobetasol ointment (TEMOVATE) 0.05 % Apply to affected area every night for 4 weeks, then every other day for 4 weeks and then twice a week for 4 weeks or until resolution. 30 g 5  .  FARXIGA 10 MG TABS tablet TAKE 1 TABLET BY MOUTH EVERY DAY 90 tablet 0  . glimepiride (AMARYL) 2 MG tablet Take 2 mg by mouth daily with breakfast.    . icosapent Ethyl (VASCEPA) 1 g capsule Take 2 capsules (2 g total) by mouth 2 (two) times daily. 360 capsule 3  . metFORMIN (GLUCOPHAGE) 1000 MG tablet Take 500 mg by mouth 2 (two) times daily with a meal.    . rosuvastatin (CRESTOR) 40 MG tablet Take 1 tablet (40 mg total) by mouth daily. 90 tablet 0  . sacubitril-valsartan (ENTRESTO) 49-51 MG Take 1 tablet by mouth 2 (two) times daily. 180 tablet 0  . spironolactone (ALDACTONE) 25 MG tablet Take 1 tablet (25 mg total) by mouth daily. 90 tablet 3  . carvedilol (COREG) 25 MG tablet Take 1 tablet (25 mg total) by mouth 2 (two) times daily. 180 tablet 3  . furosemide (LASIX) 40 MG tablet Take 1 tablet (40 mg total) by mouth daily as needed for fluid or edema. (Patient not taking: Reported on 01/29/2020) 90 tablet 0   No current facility-administered medications for this encounter.   BP 120/84   Pulse 70   Wt 86.7 kg (191 lb 3.2 oz)   SpO2 97%   BMI 33.87 kg/m  General: NAD Neck: No  JVD, no thyromegaly or thyroid nodule.  Lungs: Clear to auscultation bilaterally with normal respiratory effort. CV: Nondisplaced PMI.  Heart regular S1/S2, no S3/S4, no murmur.  No peripheral edema.  No carotid bruit.  Normal pedal pulses.  Abdomen: Soft, nontender, no hepatosplenomegaly, no distention.  Skin: Intact without lesions or rashes.  Neurologic: Alert and oriented x 3.  Psych: Normal affect. Extremities: No clubbing or cyanosis.  HEENT: Normal.   Assessment/Plan: 1. Chronic systolic CHF: Ischemic cardiomyopathy.  St Jude ICD.  Echo (10/20) with EF 30-35%, mild-moderate MR.  Echo in 10/21 with EF 35-40%.   She is not volume overloaded by exam.  NYHA class II symptoms.  - Continue spironolactone 25 mg daily.    - Increase Coreg to 25 mg bid. - Continue Entresto 49/51 bid. BMET today.   - Has not  needed Lasix.  - Continue dapagliflozin 10 mg daily.  2. CAD: s/p CABG, no chest pain.  - Continue ASA 81 daily.  - Continue Crestor 40 daily, good lipids in 7/21.  3. Hyperlipidemia: She is on Crestor and Vascepa.  Good lipids in 7/21.   Followup in 3-4 months  Marca Ancona 01/30/2020

## 2020-02-02 NOTE — Progress Notes (Signed)
Remote ICD transmission.   

## 2020-02-03 ENCOUNTER — Other Ambulatory Visit: Payer: Self-pay | Admitting: Internal Medicine

## 2020-02-20 ENCOUNTER — Telehealth: Payer: Self-pay

## 2020-02-20 NOTE — Telephone Encounter (Signed)
Advised caller per [employer] Jude rep, ICD is protected from interaction with dental equipment including utrasonic devices.

## 2020-02-20 NOTE — Telephone Encounter (Signed)
Veronica from a dental office. She wanted to know if there implanted dental device will interact with the ICD.

## 2020-02-24 ENCOUNTER — Telehealth (INDEPENDENT_AMBULATORY_CARE_PROVIDER_SITE_OTHER): Payer: 59 | Admitting: Internal Medicine

## 2020-02-24 ENCOUNTER — Encounter: Payer: Self-pay | Admitting: Internal Medicine

## 2020-02-24 DIAGNOSIS — J3089 Other allergic rhinitis: Secondary | ICD-10-CM | POA: Diagnosis not present

## 2020-02-24 NOTE — Progress Notes (Addendum)
Virtual Visit via Video Note  I connected with Monique Franklin on 02/24/20 at  8:30 AM EST by a video enabled telemedicine application and verified that I am speaking with the correct person using two identifiers.  Location: Patient: In her car Provider: Office   Person's participating in this video call: Nicki Reaper, NP-C and [name].  I discussed the limitations of evaluation and management by telemedicine and the availability of in person appointments. The patient expressed understanding and agreed to proceed.  History of Present Illness:  Pt reports sneezing and scratchy throat. This started 3 days ago. She denies difficulty swallowing. She denies headache, runny nose, nasal congestion, ear pain, loss of taste/smell, cough or SOB. She denies fever, chills or body aches. She has tried ASA OTC with minimal relief. She has not had sick contacts or exposure to Covid that she is aware of. She has had her Covid vaccine.  Past Medical History:  Diagnosis Date  . Cardiomyopathy (HCC)   . CHF (congestive heart failure) (HCC)    Class I  . Coronary artery disease   . Diabetes mellitus without complication (HCC)   . GERD (gastroesophageal reflux disease)   . HFrEF (heart failure with reduced ejection fraction) (HCC)   . Hyperlipidemia   . Hypertension   . MI (myocardial infarction) (HCC)   . PAD (peripheral artery disease) (HCC)     Current Outpatient Medications  Medication Sig Dispense Refill  . aspirin EC 81 MG tablet Take 81 mg by mouth daily.    . carvedilol (COREG) 25 MG tablet Take 1 tablet (25 mg total) by mouth 2 (two) times daily. 180 tablet 3  . clobetasol ointment (TEMOVATE) 0.05 % Apply to affected area every night for 4 weeks, then every other day for 4 weeks and then twice a week for 4 weeks or until resolution. 30 g 5  . FARXIGA 10 MG TABS tablet TAKE 1 TABLET BY MOUTH EVERY DAY 90 tablet 0  . furosemide (LASIX) 40 MG tablet Take 1 tablet (40 mg total) by mouth  daily as needed for fluid or edema. 90 tablet 0  . glimepiride (AMARYL) 2 MG tablet Take 2 mg by mouth daily with breakfast.    . icosapent Ethyl (VASCEPA) 1 g capsule Take 2 capsules (2 g total) by mouth 2 (two) times daily. 360 capsule 3  . metFORMIN (GLUCOPHAGE) 1000 MG tablet Take 500 mg by mouth 2 (two) times daily with a meal.    . rosuvastatin (CRESTOR) 40 MG tablet Take 1 tablet (40 mg total) by mouth daily. 90 tablet 0  . sacubitril-valsartan (ENTRESTO) 49-51 MG Take 1 tablet by mouth 2 (two) times daily. 180 tablet 0  . spironolactone (ALDACTONE) 25 MG tablet Take 1 tablet (25 mg total) by mouth daily. 90 tablet 3   No current facility-administered medications for this visit.    Allergies  Allergen Reactions  . Ticagrelor Rash    Family History  Problem Relation Age of Onset  . Heart attack Mother   . COPD Father   . Thyroid cancer Sister   . Breast cancer Sister 49  . Diabetes Sister   . Diabetes Brother     Social History   Socioeconomic History  . Marital status: Widowed    Spouse name: Not on file  . Number of children: Not on file  . Years of education: Not on file  . Highest education level: Not on file  Occupational History  . Not on file  Tobacco Use  . Smoking status: Former Smoker    Packs/day: 1.00    Years: 33.00    Pack years: 33.00  . Smokeless tobacco: Never Used  Vaping Use  . Vaping Use: Never used  Substance and Sexual Activity  . Alcohol use: Yes    Comment: occasional  . Drug use: Never  . Sexual activity: Not Currently    Birth control/protection: Post-menopausal  Other Topics Concern  . Not on file  Social History Narrative  . Not on file   Social Determinants of Health   Financial Resource Strain:   . Difficulty of Paying Living Expenses: Not on file  Food Insecurity:   . Worried About Programme researcher, broadcasting/film/video in the Last Year: Not on file  . Ran Out of Food in the Last Year: Not on file  Transportation Needs:   . Lack of  Transportation (Medical): Not on file  . Lack of Transportation (Non-Medical): Not on file  Physical Activity:   . Days of Exercise per Week: Not on file  . Minutes of Exercise per Session: Not on file  Stress:   . Feeling of Stress : Not on file  Social Connections:   . Frequency of Communication with Friends and Family: Not on file  . Frequency of Social Gatherings with Friends and Family: Not on file  . Attends Religious Services: Not on file  . Active Member of Clubs or Organizations: Not on file  . Attends Banker Meetings: Not on file  . Marital Status: Not on file  Intimate Partner Violence:   . Fear of Current or Ex-Partner: Not on file  . Emotionally Abused: Not on file  . Physically Abused: Not on file  . Sexually Abused: Not on file     Constitutional: Denies fever, malaise, fatigue, headache or abrupt weight changes.  HEENT: Pt reports sneezing, scratchy throat. Denies eye pain, eye redness, ear pain, ringing in the ears, wax buildup, runny nose, nasal congestion, bloody nose. Respiratory: Denies difficulty breathing, shortness of breath, cough or sputum production.   Cardiovascular: Denies chest pain, chest tightness, palpitations or swelling in the hands or feet.   No other specific complaints in a complete review of systems (except as listed in HPI above).  Observations/Objective:   Wt Readings from Last 3 Encounters:  01/29/20 191 lb 3.2 oz (86.7 kg)  01/02/20 190 lb (86.2 kg)  01/02/20 189 lb 6 oz (85.9 kg)    General: Appears her stated age. HEENT: Nose: no congestion noted; Throat/Mouth: no hoarseness noted Pulmonary/Chest: Normal effort. No respiratory distress.  Neurological: Alert and oriented.    BMET    Component Value Date/Time   NA 137 01/29/2020 1044   NA 139 06/12/2019 0840   K 4.5 01/29/2020 1044   CL 104 01/29/2020 1044   CO2 21 (L) 01/29/2020 1044   GLUCOSE 210 (H) 01/29/2020 1044   BUN 22 01/29/2020 1044   BUN 28 (H)  06/12/2019 0840   CREATININE 1.02 (H) 01/29/2020 1044   CALCIUM 9.8 01/29/2020 1044   GFRNONAA >60 01/29/2020 1044   GFRAA >60 11/03/2019 1139    Lipid Panel     Component Value Date/Time   CHOL 156 10/20/2019 1223   TRIG 215 (H) 10/20/2019 1223   HDL 57 10/20/2019 1223   CHOLHDL 2.7 10/20/2019 1223   VLDL 43 (H) 10/20/2019 1223   LDLCALC 56 10/20/2019 1223    CBC    Component Value Date/Time   WBC 7.3 01/22/2019  0759   RBC 4.26 01/22/2019 0759   HGB 12.2 01/22/2019 0759   HCT 36.6 01/22/2019 0759   PLT 247.0 01/22/2019 0759   MCV 85.8 01/22/2019 0759   MCHC 33.4 01/22/2019 0759   RDW 15.4 01/22/2019 0759    Hgb A1C Lab Results  Component Value Date   HGBA1C 7.1 (H) 07/21/2019       Assessment and Plan:  Allergic Rhinitis:  Start Zyrtec and Flonase OTC No indication for strep or Covid testing at this time Can use salt water gargles, hot tea etc for comfort  Update me later in the week if symptoms persist or worsen  Follow Up Instructions:    I discussed the assessment and treatment plan with the patient. The patient was provided an opportunity to ask questions and all were answered. The patient agreed with the plan and demonstrated an understanding of the instructions.   The patient was advised to call back or seek an in-person evaluation if the symptoms worsen or if the condition fails to improve as anticipated.   Nicki Reaper, NP

## 2020-02-24 NOTE — Patient Instructions (Signed)
Allergic Rhinitis, Adult Allergic rhinitis is a reaction to allergens in the air. Allergens are tiny specks (particles) in the air that cause your body to have an allergic reaction. This condition cannot be passed from person to person (is not contagious). Allergic rhinitis cannot be cured, but it can be controlled. There are two types of allergic rhinitis:  Seasonal. This type is also called hay fever. It happens only during certain times of the year.  Perennial. This type can happen at any time of the year. What are the causes? This condition may be caused by:  Pollen from grasses, trees, and weeds.  House dust mites.  Pet dander.  Mold. What are the signs or symptoms? Symptoms of this condition include:  Sneezing.  Runny or stuffy nose (nasal congestion).  A lot of mucus in the back of the throat (postnasal drip).  Itchy nose.  Tearing of the eyes.  Trouble sleeping.  Being sleepy during day. How is this treated? There is no cure for this condition. You should avoid things that trigger your symptoms (allergens). Treatment can help to relieve symptoms. This may include:  Medicines that block allergy symptoms, such as antihistamines. These may be given as a shot, nasal spray, or pill.  Shots that are given until your body becomes less sensitive to the allergen (desensitization).  Stronger medicines, if all other treatments have not worked. Follow these instructions at home: Avoiding allergens   Find out what you are allergic to. Common allergens include smoke, dust, and pollen.  Avoid them if you can. These are some of the things that you can do to avoid allergens: ? Replace carpet with wood, tile, or vinyl flooring. Carpet can trap dander and dust. ? Clean any mold found in the home. ? Do not smoke. Do not allow smoking in your home. ? Change your heating and air conditioning filter at least once a month. ? During allergy season:  Keep windows closed as much as  you can. If possible, use air conditioning when there is a lot of pollen in the air.  Use a special filter for allergies with your furnace and air conditioner.  Plan outdoor activities when pollen counts are lowest. This is usually during the early morning or evening hours.  If you do go outdoors when pollen count is high, wear a special mask for people with allergies.  When you come indoors, take a shower and change your clothes before sitting on furniture or bedding. General instructions  Do not use fans in your home.  Do not hang clothes outside to dry.  Wear sunglasses to keep pollen out of your eyes.  Wash your hands right away after you touch household pets.  Take over-the-counter and prescription medicines only as told by your doctor.  Keep all follow-up visits as told by your doctor. This is important. Contact a doctor if:  You have a fever.  You have a cough that does not go away (is persistent).  You start to make whistling sounds when you breathe (wheeze).  Your symptoms do not get better with treatment.  You have thick fluid coming from your nose.  You start to have nosebleeds. Get help right away if:  Your tongue or your lips are swollen.  You have trouble breathing.  You feel dizzy or you feel like you are going to pass out (faint).  You have cold sweats. Summary  Allergic rhinitis is a reaction to allergens in the air.  This condition may be   caused by allergens. These include pollen, dust mites, pet dander, and mold.  Symptoms include a runny, itchy nose, sneezing, or tearing eyes. You may also have trouble sleeping or feel sleepy during the day.  Treatment includes taking medicines and avoiding allergens. You may also get shots or take stronger medicines.  Get help if you have a fever or a cough that does not stop. Get help right away if you are short of breath. This information is not intended to replace advice given to you by your health care  provider. Make sure you discuss any questions you have with your health care provider. Document Revised: 07/16/2018 Document Reviewed: 10/16/2017 Elsevier Patient Education  2020 Elsevier Inc.  

## 2020-03-08 ENCOUNTER — Other Ambulatory Visit: Payer: Self-pay | Admitting: Internal Medicine

## 2020-03-22 ENCOUNTER — Encounter: Payer: Self-pay | Admitting: Internal Medicine

## 2020-03-23 MED ORDER — DAPAGLIFLOZIN PROPANEDIOL 10 MG PO TABS: 10.0000 mg | ORAL_TABLET | Freq: Every day | ORAL | 0 refills | Status: DC

## 2020-03-23 MED ORDER — DAPAGLIFLOZIN PROPANEDIOL 10 MG PO TABS
10.0000 mg | ORAL_TABLET | Freq: Every day | ORAL | 0 refills | Status: DC
Start: 2020-03-23 — End: 2020-03-23

## 2020-03-23 NOTE — Addendum Note (Signed)
Addended by: Roena Malady on: 03/23/2020 08:34 AM   Modules accepted: Orders

## 2020-04-14 ENCOUNTER — Ambulatory Visit: Payer: 59 | Admitting: Dermatology

## 2020-04-26 ENCOUNTER — Ambulatory Visit (INDEPENDENT_AMBULATORY_CARE_PROVIDER_SITE_OTHER): Payer: 59

## 2020-04-26 DIAGNOSIS — I429 Cardiomyopathy, unspecified: Secondary | ICD-10-CM | POA: Diagnosis not present

## 2020-04-26 DIAGNOSIS — I5022 Chronic systolic (congestive) heart failure: Secondary | ICD-10-CM

## 2020-04-28 LAB — CUP PACEART REMOTE DEVICE CHECK
Battery Remaining Longevity: 82 mo
Battery Remaining Percentage: 79 %
Battery Voltage: 2.99 V
Brady Statistic RV Percent Paced: 1 %
Date Time Interrogation Session: 20220117020017
HighPow Impedance: 78 Ohm
HighPow Impedance: 78 Ohm
Implantable Lead Implant Date: 20200707
Implantable Lead Location: 753860
Implantable Pulse Generator Implant Date: 20200707
Lead Channel Impedance Value: 660 Ohm
Lead Channel Pacing Threshold Amplitude: 0.5 V
Lead Channel Pacing Threshold Pulse Width: 0.5 ms
Lead Channel Sensing Intrinsic Amplitude: 11.7 mV
Lead Channel Setting Pacing Amplitude: 2.5 V
Lead Channel Setting Pacing Pulse Width: 0.5 ms
Lead Channel Setting Sensing Sensitivity: 0.5 mV
Pulse Gen Serial Number: 9816687

## 2020-04-30 ENCOUNTER — Other Ambulatory Visit: Payer: Self-pay

## 2020-04-30 ENCOUNTER — Ambulatory Visit (HOSPITAL_COMMUNITY)
Admission: RE | Admit: 2020-04-30 | Discharge: 2020-04-30 | Disposition: A | Discharge status: Home / Self Care | Payer: 59 | Source: Ambulatory Visit | Attending: Cardiology | Admitting: Cardiology

## 2020-04-30 ENCOUNTER — Encounter (HOSPITAL_COMMUNITY): Payer: Self-pay | Admitting: Cardiology

## 2020-04-30 VITALS — BP 110/78 | HR 69 | Wt 191.8 lb

## 2020-04-30 DIAGNOSIS — Z951 Presence of aortocoronary bypass graft: Secondary | ICD-10-CM | POA: Insufficient documentation

## 2020-04-30 DIAGNOSIS — E785 Hyperlipidemia, unspecified: Secondary | ICD-10-CM | POA: Insufficient documentation

## 2020-04-30 DIAGNOSIS — I5022 Chronic systolic (congestive) heart failure: Secondary | ICD-10-CM | POA: Diagnosis not present

## 2020-04-30 DIAGNOSIS — Z79899 Other long term (current) drug therapy: Secondary | ICD-10-CM | POA: Diagnosis not present

## 2020-04-30 DIAGNOSIS — I255 Ischemic cardiomyopathy: Secondary | ICD-10-CM | POA: Diagnosis not present

## 2020-04-30 DIAGNOSIS — I2510 Atherosclerotic heart disease of native coronary artery without angina pectoris: Secondary | ICD-10-CM | POA: Diagnosis not present

## 2020-04-30 DIAGNOSIS — Z8249 Family history of ischemic heart disease and other diseases of the circulatory system: Secondary | ICD-10-CM | POA: Insufficient documentation

## 2020-04-30 DIAGNOSIS — I252 Old myocardial infarction: Secondary | ICD-10-CM | POA: Diagnosis not present

## 2020-04-30 DIAGNOSIS — I509 Heart failure, unspecified: Secondary | ICD-10-CM | POA: Diagnosis not present

## 2020-04-30 DIAGNOSIS — Z7982 Long term (current) use of aspirin: Secondary | ICD-10-CM | POA: Insufficient documentation

## 2020-04-30 DIAGNOSIS — I110 Hypertensive heart disease with heart failure: Secondary | ICD-10-CM | POA: Diagnosis not present

## 2020-04-30 LAB — BASIC METABOLIC PANEL
Anion gap: 10 (ref 5–15)
BUN: 23 mg/dL (ref 8–23)
CO2: 21 mmol/L — ABNORMAL LOW (ref 22–32)
Calcium: 9.4 mg/dL (ref 8.9–10.3)
Chloride: 104 mmol/L (ref 98–111)
Creatinine, Ser: 1.01 mg/dL — ABNORMAL HIGH (ref 0.44–1.00)
GFR, Estimated: >60 mL/min (ref >60)
Glucose, Bld: 295 mg/dL — ABNORMAL HIGH (ref 70–99)
Potassium: 4.7 mmol/L (ref 3.5–5.1)
Sodium: 135 mmol/L (ref 135–145)

## 2020-04-30 MED ORDER — ENTRESTO 97-103 MG PO TABS: 1.0000 | ORAL_TABLET | Freq: Two times a day (BID) | ORAL | 3 refills | Status: DC

## 2020-04-30 NOTE — Patient Instructions (Addendum)
INCREASE Entresto 97/103mg  (1 tablet) twice daily  Labs done today, your results will be available in MyChart, we will contact you for abnormal readings.  Your physician recommends that you schedule repeat labs in 2 weeks  Your physician recommends that you schedule a follow-up appointment in: 4 months  If you have any questions or concerns before your next appointment please send [place] a message through [place] or call our office at [phone].    TO LEAVE A MESSAGE FOR THE NURSE SELECT OPTION 2, PLEASE LEAVE A MESSAGE INCLUDING: . YOUR NAME . DATE OF BIRTH . CALL BACK NUMBER . REASON FOR CALL**this is important as we prioritize the call backs  YOU WILL RECEIVE A CALL BACK THE SAME DAY AS LONG AS YOU CALL BEFORE 4:00 PM

## 2020-05-01 NOTE — Progress Notes (Signed)
PCP: Lorre Munroe, NP  Cardiology: Dr. Azucena Cecil HF Cardiology: Dr. Shirlee Latch  65 y.o. with CAD and ischemic cardiomyopathy was referred by Dr. Azucena Cecil for evaluation of CHF.  She lived in [place] in the past and had her initial cardiology care there.  In 4/19, she had PCI to LAD and LCx with 5 stents. She then had an MI in 5/19 and had CABG with LIMA-LAD and SVG-OM.  She developed an ischemic cardiomyopathy and had a St Jude ICD placed.  She moved to [facility] and has been followed there more recently.  Echo in 10/20 showed EF 30-35%, mild LV dilation, mild-moderate MR.  Echo in 10/21 showed EF 35-40%.      She returns for followup of CHF.  No dyspnea walking on flat ground. She gets fatigued walking around the grocery store.  She has bendopnea.  No orthopnea/PND. She has not been taking Lasix.  Weight is stable.    St Jude device interrogation: Stable thoracic impedance (though about 1 week ago, impedance was low).   Labs (1/21): K 4.5, creatinine 1.09, LDL 54, TGs 196 Labs (4/21): K 4.2, creatinine 0.94 Labs (7/21): K 4.5, creatinine 0.96, LDL 56, HDl 57 Labs (10/21) K 4.5, creatinine 1.02  ECG (personally reviewed): NSR, poor RWP  PMH: 1. HTN 2. Type 2 diabetes 3. CAD: PCI to LAD and LCx in 4/19, 5 stents altogether.  - MI in 5/19 with CABG (LIMA-LAD, SVG-OM).  4. Hyperlipidemia 5. Chronic systolic CHF: Ischemic cardiomyopathy.   - St Jude ICD.  - Echo (10/20): EF 30-35%, mildly dilated LV, normal RV, mild to moderate MR.  - Echo (10/21): EF 35-40%, diffuse hypokinesis, normal RV 6. Left femoral thrombendarterectomy with patch angioplasty in 9/19.  Complication of Impella.   FH: Mother with CABG in 58s, MI in 66s.   SH: Widowed, lives in [place], nonsmoker. She has children locally.  Not working.   ROS: All systems reviewed and negative except as per HPI.   Current Outpatient Medications  Medication Sig Dispense Refill  . aspirin EC 81 MG tablet Take 81 mg by  mouth daily.    . carvedilol (COREG) 25 MG tablet Take 1 tablet (25 mg total) by mouth 2 (two) times daily. 180 tablet 3  . clobetasol ointment (TEMOVATE) 0.05 % Apply to affected area every night for 4 weeks, then every other day for 4 weeks and then twice a week for 4 weeks or until resolution. 30 g 5  . dapagliflozin propanediol (FARXIGA) 10 MG TABS tablet Take 1 tablet (10 mg total) by mouth daily. 90 tablet 0  . furosemide (LASIX) 40 MG tablet Take 1 tablet (40 mg total) by mouth daily as needed for fluid or edema. 90 tablet 0  . glimepiride (AMARYL) 2 MG tablet Take 2 mg by mouth daily with breakfast.    . icosapent Ethyl (VASCEPA) 1 g capsule Take 2 capsules (2 g total) by mouth 2 (two) times daily. 360 capsule 3  . metFORMIN (GLUCOPHAGE) 1000 MG tablet Take 500 mg by mouth 2 (two) times daily with a meal.    . rosuvastatin (CRESTOR) 40 MG tablet Take 1 tablet (40 mg total) by mouth daily at 6 PM. MUST SCHEDULE PHYSICAL 90 tablet 0  . sacubitril-valsartan (ENTRESTO) 97-103 MG Take 1 tablet by mouth 2 (two) times daily. 60 tablet 3  . spironolactone (ALDACTONE) 25 MG tablet Take 1 tablet (25 mg total) by mouth daily. 90 tablet 3   No current facility-administered medications for this  encounter.   BP 110/78   Pulse 69   Wt 87 kg (191 lb 12.8 oz)   SpO2 98%   BMI 33.98 kg/m  General: NAD Neck: No JVD, no thyromegaly or thyroid nodule.  Lungs: Clear to auscultation bilaterally with normal respiratory effort. CV: Nondisplaced PMI.  Heart regular S1/S2, no S3/S4, no murmur.  No peripheral edema.  No carotid bruit.  Normal pedal pulses.  Abdomen: Soft, nontender, no hepatosplenomegaly, no distention.  Skin: Intact without lesions or rashes.  Neurologic: Alert and oriented x 3.  Psych: Normal affect. Extremities: No clubbing or cyanosis.  HEENT: Normal.   Assessment/Plan: 1. Chronic systolic CHF: Ischemic cardiomyopathy.  St Jude ICD.  Echo (10/20) with EF 30-35%, mild-moderate MR.   Echo in 10/21 with EF 35-40%.   She is not volume overloaded by exam.  NYHA class II symptoms.  - Continue spironolactone 25 mg daily.    - Continue Coreg 25 mg bid. - Increase Entresto to 97/103 bid with BMET today and in 10 days.   - Has not needed Lasix.  - Continue dapagliflozin 10 mg daily.  2. CAD: s/p CABG, no chest pain.  - Continue ASA 81 daily.  - Continue Crestor 40 daily, good lipids in 7/21.  3. Hyperlipidemia: She is on Crestor and Vascepa.  Good lipids in 7/21.   Followup in 4 months  Marca Ancona 05/01/2020

## 2020-05-03 ENCOUNTER — Telehealth (HOSPITAL_COMMUNITY): Payer: Self-pay

## 2020-05-03 ENCOUNTER — Other Ambulatory Visit (HOSPITAL_COMMUNITY): Payer: Self-pay | Admitting: *Deleted

## 2020-05-03 MED ORDER — ENTRESTO 97-103 MG PO TABS: 1.0000 | ORAL_TABLET | Freq: Two times a day (BID) | ORAL | 3 refills | Status: DC

## 2020-05-03 NOTE — Telephone Encounter (Signed)
-----   Message from Laurey Morale, MD sent at 04/30/2020  2:19 PM EST ----- Labs ok except glucose high.  Forward to PCP.

## 2020-05-03 NOTE — Telephone Encounter (Signed)
Pt aware of results and states that she is currently working on getting her blood sugar better managed. Aware results will be forwarded to primary. Pt request 90 day supply of entresto. She was ordered for 30 day recently. 90 day called in to [facility].

## 2020-05-10 NOTE — Progress Notes (Signed)
Remote ICD transmission.   

## 2020-05-11 ENCOUNTER — Other Ambulatory Visit: Payer: Self-pay

## 2020-05-12 ENCOUNTER — Ambulatory Visit (INDEPENDENT_AMBULATORY_CARE_PROVIDER_SITE_OTHER): Payer: Medicare Other | Admitting: Internal Medicine

## 2020-05-12 ENCOUNTER — Encounter: Payer: Self-pay | Admitting: Internal Medicine

## 2020-05-12 VITALS — BP 114/72 | HR 73 | Temp 97.1°F | Ht 63.25 in | Wt 189.0 lb

## 2020-05-12 DIAGNOSIS — I10 Essential (primary) hypertension: Secondary | ICD-10-CM

## 2020-05-12 DIAGNOSIS — E7800 Pure hypercholesterolemia, unspecified: Secondary | ICD-10-CM | POA: Diagnosis not present

## 2020-05-12 DIAGNOSIS — I428 Other cardiomyopathies: Secondary | ICD-10-CM

## 2020-05-12 DIAGNOSIS — E119 Type 2 diabetes mellitus without complications: Secondary | ICD-10-CM

## 2020-05-12 DIAGNOSIS — K219 Gastro-esophageal reflux disease without esophagitis: Secondary | ICD-10-CM

## 2020-05-12 DIAGNOSIS — I219 Acute myocardial infarction, unspecified: Secondary | ICD-10-CM

## 2020-05-12 DIAGNOSIS — I2510 Atherosclerotic heart disease of native coronary artery without angina pectoris: Secondary | ICD-10-CM | POA: Diagnosis not present

## 2020-05-12 DIAGNOSIS — Z0000 Encounter for general adult medical examination without abnormal findings: Secondary | ICD-10-CM

## 2020-05-12 DIAGNOSIS — I509 Heart failure, unspecified: Secondary | ICD-10-CM

## 2020-05-12 NOTE — Progress Notes (Unsigned)
HPI:  Patient presents the clinic today for her welcome to Medicare exam.  She is also due to follow-up chronic conditions.  HTN with Cardiomyopathy: Her BP today is 114/72.  She is taking Carvedilol, Furosemide, Spironolactone, Entresto as prescribed.  ECG from 04/2020 reviewed.  HLD with CAD, PAD status post MI with Stents: She denies myalgias on Rosuvastatin.  She is taking Vascepa, Carvedilol and Aspirin as well.  She is follows with cardiology.  CHF: She denies chronic cough, shortness of breath or edema.  She is taking Entresto, Spironolactone, Furosemide and Carvedilol as prescribed.  She follows with cardiology.  GERD: Intermittent.  She takes Tums as needed with good relief.  There is no upper GI on file.  DM2: Her last A1c was 7.1%, 07/2019.  She is taking Metformin, Glimepiride and Farxiga as prescribed.  She checks her feet routinely.  Past Medical History:  Diagnosis Date  . Cardiomyopathy ([place])   . CHF (congestive heart failure) (HCC)    Class I  . Coronary artery disease   . Diabetes mellitus without complication ([place])   . GERD (gastroesophageal reflux disease)   . HFrEF (heart failure with reduced ejection fraction) ([place])   . Hyperlipidemia   . Hypertension   . MI (myocardial infarction) ([place])   . PAD (peripheral artery disease) (HCC)     Current Outpatient Medications  Medication Sig Dispense Refill  . aspirin EC 81 MG tablet Take 81 mg by mouth daily.    . carvedilol (COREG) 25 MG tablet Take 1 tablet (25 mg total) by mouth 2 (two) times daily. 180 tablet 3  . clobetasol ointment (TEMOVATE) 0.05 % Apply to affected area every night for 4 weeks, then every other day for 4 weeks and then twice a week for 4 weeks or until resolution. 30 g 5  . dapagliflozin propanediol (FARXIGA) 10 MG TABS tablet Take 1 tablet (10 mg total) by mouth daily. 90 tablet 0  . furosemide (LASIX) 40 MG tablet Take 1 tablet (40 mg total) by mouth daily as needed for fluid or edema. 90 tablet  0  . glimepiride (AMARYL) 2 MG tablet Take 2 mg by mouth daily with breakfast.    . icosapent Ethyl (VASCEPA) 1 g capsule Take 2 capsules (2 g total) by mouth 2 (two) times daily. 360 capsule 3  . metFORMIN (GLUCOPHAGE) 1000 MG tablet Take 500 mg by mouth 2 (two) times daily with a meal.    . rosuvastatin (CRESTOR) 40 MG tablet Take 1 tablet (40 mg total) by mouth daily at 6 PM. MUST SCHEDULE PHYSICAL 90 tablet 0  . sacubitril-valsartan (ENTRESTO) 97-103 MG Take 1 tablet by mouth 2 (two) times daily. 180 tablet 3  . spironolactone (ALDACTONE) 25 MG tablet Take 1 tablet (25 mg total) by mouth daily. 90 tablet 3   No current facility-administered medications for this visit.    Allergies  Allergen Reactions  . Ticagrelor Rash    Family History  Problem Relation Age of Onset  . Heart attack Mother   . COPD Father   . Thyroid cancer Sister   . Breast cancer Sister 8  . Diabetes Sister   . Diabetes Brother     Social History   Socioeconomic History  . Marital status: Widowed    Spouse name: Not on file  . Number of children: Not on file  . Years of education: Not on file  . Highest education level: Not on file  Occupational History  . Not on file  Tobacco Use  . Smoking status: Former Smoker    Packs/day: 1.00    Years: 33.00    Pack years: 33.00  . Smokeless tobacco: Never Used  Vaping Use  . Vaping Use: Never used  Substance and Sexual Activity  . Alcohol use: Yes    Comment: occasional  . Drug use: Never  . Sexual activity: Not Currently    Birth control/protection: Post-menopausal  Other Topics Concern  . Not on file  Social History Narrative  . Not on file   Social Determinants of Health   Financial Resource Strain: Not on file  Food Insecurity: Not on file  Transportation Needs: Not on file  Physical Activity: Not on file  Stress: Not on file  Social Connections: Not on file  Intimate Partner Violence: Not on file    Hospitiliaztions: None  Health  Maintenance:    Flu: 12/2018  Tetanus: unsure  Shingrix: 2020  Covid: [place] x3  Mammogram: 10/2019  Pap Smear: 09/2019  Colon Screening: 10/2019  Eye Doctor: annually  Dental Exam: quarterly   Providers:   PCP: Webb Silversmith, NP  Cardiologist: Dr. Almeta Monas, Dr. Caryl Comes    I have personally reviewed and have noted:  1. The patient's medical and social history 2. Their use of alcohol, tobacco or illicit drugs 3. Their current medications and supplements 4. The patient's functional ability including ADL's, fall risks, home safety risks and hearing or visual impairment. 5. Diet and physical activities 6. Evidence for depression or mood disorder  Subjective:   Review of Systems:   Constitutional: Denies fever, malaise, fatigue, headache or abrupt weight changes.  HEENT: Denies eye pain, eye redness, ear pain, ringing in the ears, wax buildup, runny nose, nasal congestion, bloody nose, or sore throat. Respiratory: Denies difficulty breathing, shortness of breath, cough or sputum production.   Cardiovascular: Denies chest pain, chest tightness, palpitations or swelling in the hands or feet.  Gastrointestinal: Pt reports intermittent reflux. Denies abdominal pain, bloating, constipation, diarrhea or blood in the stool.  GU: Denies urgency, frequency, pain with urination, burning sensation, blood in urine, odor or discharge. Musculoskeletal: Denies decrease in range of motion, difficulty with gait, muscle pain or joint pain and swelling.  Skin: Denies redness, rashes, lesions or ulcercations.  Neurological: Denies dizziness, difficulty with memory, difficulty with speech or problems with balance and coordination.  Psych: Denies anxiety, depression, SI/HI.  No other specific complaints in a complete review of systems (except as listed in HPI above).  Objective:  PE:  BP 114/72   Pulse 73   Temp (!) 97.1 F (36.2 C) (Temporal)   Ht 5' 3.25" (1.607 m)   Wt 189 lb (85.7 kg)    SpO2 98%   BMI 33.22 kg/m   Wt Readings from Last 3 Encounters:  04/30/20 191 lb 12.8 oz (87 kg)  01/29/20 191 lb 3.2 oz (86.7 kg)  01/02/20 190 lb (86.2 kg)    General: Appears her stated age, obese, in NAD. Skin: Warm, dry and intact. No ulcerations noted. HEENT: Head: normal shape and size; Eyes: sclera white, no icterus, conjunctiva pink, PERRLA and EOMs intact;  Neck: Neck supple, trachea midline. No masses, lumps or thyromegaly present.  Cardiovascular: Normal rate and rhythm. S1,S2 noted.  No murmur, rubs or gallops noted. No JVD or BLE edema. No carotid bruits noted. Pulmonary/Chest: Normal effort and positive vesicular breath sounds. No respiratory distress. No wheezes, rales or ronchi noted.  Abdomen: Soft and nontender. Normal bowel sounds. No distention  or masses noted. Liver, spleen and kidneys non palpable. Musculoskeletal:  Strength 5/5 BUE/BLE. No signs of joint swelling.  Neurological: Alert and oriented. Cranial nerves II-XII grossly intact. Coordination normal.  Psychiatric: Mood and affect normal. Behavior is normal. Judgment and thought content normal.   EKG: Pt refused  BMET    Component Value Date/Time   NA 135 04/30/2020 1051   NA 139 06/12/2019 0840   K 4.7 04/30/2020 1051   CL 104 04/30/2020 1051   CO2 21 (L) 04/30/2020 1051   GLUCOSE 295 (H) 04/30/2020 1051   BUN 23 04/30/2020 1051   BUN 28 (H) 06/12/2019 0840   CREATININE 1.01 (H) 04/30/2020 1051   CALCIUM 9.4 04/30/2020 1051   GFRNONAA >60 04/30/2020 1051   GFRAA >60 11/03/2019 1139    Lipid Panel     Component Value Date/Time   CHOL 156 10/20/2019 1223   TRIG 215 (H) 10/20/2019 1223   HDL 57 10/20/2019 1223   CHOLHDL 2.7 10/20/2019 1223   VLDL 43 (H) 10/20/2019 1223   LDLCALC 56 10/20/2019 1223    CBC    Component Value Date/Time   WBC 7.3 01/22/2019 0759   RBC 4.26 01/22/2019 0759   HGB 12.2 01/22/2019 0759   HCT 36.6 01/22/2019 0759   PLT 247.0 01/22/2019 0759   MCV 85.8  01/22/2019 0759   MCHC 33.4 01/22/2019 0759   RDW 15.4 01/22/2019 0759    Hgb A1C Lab Results  Component Value Date   HGBA1C 7.1 (H) 07/21/2019      Assessment and Plan:   Medicare Annual Wellness Visit:  Diet: She does eat meat. She consumes some fruits and veggies. She does eat some fried foods. She drinks mostly water, or fruit flavered drinks Physical activity: Sedentary Depression/mood screen: Negative, PHQ 9 score of 0 Hearing: Intact to whispered voice Visual acuity: Grossly normal, performs annual eye exam  ADLs: Capable Fall risk: None Home safety: Good Cognitive evaluation: Intact to orientation, naming, recall and repetition EOL planning: Adv directives, full code/ I agree  Preventative Medicine: Flu shot today.  She declines tetanus for financial reasons.  Covid vaccine UTD.  She will get Prevnar at her next Medicare wellness exam.  Shingrix UTD.  Pap smear UTD.  Mammogram UTD.  Will obtain bone density at her next Medicare wellness exam.  Colon screening UTD.  Encouraged her to consume a balanced diet and exercise regimen.  Advised her to see an eye doctor and dentist annually.  Will check CBC, c-Met, lipid and A1c today.  Due dates for screening exam given to patient as part of her AVS.   Next appointment: 6 months, follow up chronic conditions.  Webb Silversmith, NP This visit occurred during the SARS-CoV-2 public health emergency.  Safety protocols were in place, including screening questions prior to the visit, additional usage of staff PPE, and extensive cleaning of exam room while observing appropriate contact time as indicated for disinfecting solutions.

## 2020-05-13 ENCOUNTER — Encounter: Payer: Self-pay | Admitting: Internal Medicine

## 2020-05-13 LAB — CBC
HCT: 37.2 % (ref 36.0–46.0)
Hemoglobin: 12.3 g/dL (ref 12.0–15.0)
MCHC: 33.1 g/dL (ref 30.0–36.0)
MCV: 85 fl (ref 78.0–100.0)
Platelets: 261 10*3/uL (ref 150.0–400.0)
RBC: 4.37 Mil/uL (ref 3.87–5.11)
RDW: 15.4 % (ref 11.5–15.5)
WBC: 9.1 10*3/uL (ref 4.0–10.5)

## 2020-05-13 LAB — LIPID PANEL
Cholesterol: 135 mg/dL (ref 0–200)
HDL: 44.4 mg/dL (ref >39.00)
NonHDL: 90.13
Total CHOL/HDL Ratio: 3
Triglycerides: 307 mg/dL — ABNORMAL HIGH (ref 0.0–149.0)
VLDL: 61.4 mg/dL — ABNORMAL HIGH (ref 0.0–40.0)

## 2020-05-13 LAB — COMPREHENSIVE METABOLIC PANEL
ALT: 15 U/L (ref 0–35)
AST: 13 U/L (ref 0–37)
Albumin: 4.2 g/dL (ref 3.5–5.2)
Alkaline Phosphatase: 53 U/L (ref 39–117)
BUN: 26 mg/dL — ABNORMAL HIGH (ref 6–23)
CO2: 29 mEq/L (ref 19–32)
Calcium: 10.2 mg/dL (ref 8.4–10.5)
Chloride: 103 mEq/L (ref 96–112)
Creatinine, Ser: 1.09 mg/dL (ref 0.40–1.20)
GFR: 53.53 mL/min — ABNORMAL LOW (ref >60.00)
Glucose, Bld: 109 mg/dL — ABNORMAL HIGH (ref 70–99)
Potassium: 4.2 mEq/L (ref 3.5–5.1)
Sodium: 138 mEq/L (ref 135–145)
Total Bilirubin: 0.4 mg/dL (ref 0.2–1.2)
Total Protein: 7.7 g/dL (ref 6.0–8.3)

## 2020-05-13 LAB — LDL CHOLESTEROL, DIRECT: Direct LDL: 59 mg/dL

## 2020-05-13 LAB — HEMOGLOBIN A1C: Hgb A1c MFr Bld: 8.8 % — ABNORMAL HIGH (ref 4.6–6.5)

## 2020-05-13 NOTE — Assessment & Plan Note (Signed)
C-Met and lipid profile today Continue Rosuvastatin, Vascepa, Carvedilol and aspirin Encourage low-fat diet 

## 2020-05-13 NOTE — Assessment & Plan Note (Signed)
Avoid foods that trigger reflux Encourage weight loss Continue Tums as needed

## 2020-05-13 NOTE — Assessment & Plan Note (Signed)
Controlled on Carvedilol, Furosemide, Spironolactone and Entresto C met today Encouraged DASH diet and exercise for weight loss

## 2020-05-13 NOTE — Assessment & Plan Note (Signed)
Managed on Carvedilol, Furosemide, Spironolactone and Entresto C met today Reinforced DASH diet and exercise weight loss

## 2020-05-13 NOTE — Patient Instructions (Signed)

## 2020-05-13 NOTE — Assessment & Plan Note (Signed)
A1c today No urine microalbumin secondary to ACEI therapy Encouraged her to consume a low carb diet and exercise for weight loss Continue Metformin, glimepiride and Farxiga Encourage routine foot exams Encourage routine eye exams

## 2020-05-13 NOTE — Assessment & Plan Note (Signed)
C-Met and lipid profile today Continue Rosuvastatin, Vascepa, Carvedilol and aspirin Encourage low-fat diet

## 2020-05-13 NOTE — Assessment & Plan Note (Signed)
Compensated Continue Entresto, Spironolactone, Furosemide and Carvedilol Encouraged DASH diet Monitor weights daily

## 2020-05-14 ENCOUNTER — Other Ambulatory Visit (HOSPITAL_COMMUNITY): Payer: 59

## 2020-05-17 ENCOUNTER — Encounter: Payer: Self-pay | Admitting: Internal Medicine

## 2020-05-26 ENCOUNTER — Encounter (HOSPITAL_COMMUNITY): Payer: Self-pay

## 2020-05-26 ENCOUNTER — Other Ambulatory Visit (HOSPITAL_COMMUNITY): Payer: Self-pay | Admitting: Cardiology

## 2020-05-26 ENCOUNTER — Other Ambulatory Visit (HOSPITAL_COMMUNITY): Payer: Self-pay

## 2020-05-26 MED ORDER — SPIRONOLACTONE 25 MG PO TABS: 25.0000 mg | ORAL_TABLET | Freq: Every day | ORAL | 3 refills | Status: DC

## 2020-06-22 ENCOUNTER — Other Ambulatory Visit: Payer: Self-pay

## 2020-06-22 ENCOUNTER — Ambulatory Visit (INDEPENDENT_AMBULATORY_CARE_PROVIDER_SITE_OTHER): Payer: Medicare Other | Admitting: Cardiology

## 2020-06-22 ENCOUNTER — Encounter: Payer: Self-pay | Admitting: Cardiology

## 2020-06-22 VITALS — BP 90/62 | HR 75 | Ht 63.25 in | Wt 189.0 lb

## 2020-06-22 DIAGNOSIS — I2510 Atherosclerotic heart disease of native coronary artery without angina pectoris: Secondary | ICD-10-CM | POA: Diagnosis not present

## 2020-06-22 DIAGNOSIS — I255 Ischemic cardiomyopathy: Secondary | ICD-10-CM

## 2020-06-22 DIAGNOSIS — Z95810 Presence of automatic (implantable) cardiac defibrillator: Secondary | ICD-10-CM

## 2020-06-22 NOTE — Patient Instructions (Signed)
Medication Instructions:  Your physician recommends that you continue on your current medications as directed. Please refer to the Current Medication list given to you today.  *If you need a refill on your cardiac medications before your next appointment, please call your pharmacy*   Lab Work: None ordered If you have labs (blood work) drawn today and your tests are completely normal, you will receive your results only by: Marland Kitchen MyChart Message (if you have MyChart) OR . A paper copy in the mail If you have any lab test that is abnormal or we need to change your treatment, we will call you to review the results.   Testing/Procedures:   Your physician has requested that you have an echocardiogram in 3 months. Echocardiography is a painless test that uses sound waves to create images of your heart. It provides your doctor with information about the size and shape of your heart and how well your heart's chambers and valves are working. This procedure takes approximately one hour. There are no restrictions for this procedure.     Follow-Up: At [facility], you and your health needs are our priority.  As part of our continuing mission to provide you with exceptional heart care, we have created designated Provider Care Teams.  These Care Teams include your primary Cardiologist (physician) and Advanced Practice Providers (APPs -  Physician Assistants and Nurse Practitioners) who all work together to provide you with the care you need, when you need it.  We recommend signing up for the patient portal called "MyChart".  Sign up information is provided on this After Visit Summary.  MyChart is used to connect with patients for Virtual Visits (Telemedicine).  Patients are able to view lab/test results, encounter notes, upcoming appointments, etc.  Non-urgent messages can be sent to your provider as well.   To learn more about what you can do with MyChart, go to [email or web address].    Your next  appointment:   6 month(s)  The format for your next appointment:   In Person  Provider:   Debbe Odea, MD   Other Instructions

## 2020-06-22 NOTE — Progress Notes (Signed)
Cardiology Office Note:    Date:  06/22/2020   ID:  Monique Franklin, DOB 01/19/1956, MRN [id number]  PCP:  Lorre Munroe, NP  Cardiologist:  Debbe Odea, MD  Electrophysiologist:  None   Referring MD: Lorre Munroe, NP   Chief Complaint  Patient presents with  . Follow-up    6 months  Pt states no new Sx, doing ok    History of Present Illness:    Monique Franklin is a 65 y.o. female with a hx of hypertension, hyperlipidemia, former smoker, CAD, PCI to LAD and LCx(5 stents total), status post CABG x2 (LIMA to LAD, SVG to OM 2019), HFrEF EF, s/p ICD 2020([employer] device), PAD(left femoral thrombo-endarterectomy with patch angioplasty 12/2017) who presents for follow-up.  Patient being seen for CAD, CHF.  Medications are being titrated by CHF clinic.  Tolerating heart failure medications, denies chest pain, dizziness or syncope.  Denies edema, has not required Lasix use.  Blood pressure is usually in the high 90s to low 100s, patient asymptomatic.   Prior notes Patient was originally seen to establish care.  She used to be followed at [facility] and later moved into the area.  After her CABG in 2019, she had a left femoral artery complication requiring left femoral patch angioplasty.  Earlier in 2020, she had an ICD placed due to low EF with ejection fraction of 31%.  She is a former smoker for about 20 years.   Past Medical History:  Diagnosis Date  . Cardiomyopathy (HCC)   . CHF (congestive heart failure) (H[facility])    Class I  . Coronary artery disease   . Diabetes mellitus without complication ([facility])   . GERD (gastroesophageal reflux disease)   . HFrEF (heart failure with reduced ejection fraction) ([facility])   . Hyperlipidemia   . Hypertension   . MI (myocardial infarction) (H[facility])   . PAD (peripheral artery disease) (H[facility])     Past Surgical History:  Procedure Laterality Date  . BREAST BIOPSY Bilateral 2016   neg  . CARDIAC CATHETERIZATION    . CARDIAC  DEFIBRILLATOR PLACEMENT    . COLONOSCOPY WITH PROPOFOL N/A 10/31/2019   Procedure: COLONOSCOPY WITH PROPOFOL;  Surgeon: Pasty Spillers, MD;  Location: [facility] ENDOSCOPY;  Service: Endoscopy;  Laterality: N/A;  priority 4  . COMBINED AUGMENTATION MAMMAPLASTY AND ABDOMINOPLASTY    . CORONARY ARTERY BYPASS GRAFT  08/2017   X2 with LIMA to LAD; 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;Severe 3 vessel CAD-PCI to LAD and LC x(5stents) Coronary dissection of LM to LAD   . CORONARY ARTERY BYPASS GRAFT    . CORONARY STENT PLACEMENT    . PERCUTANEOUS CORONARY STENT INTERVENTION (PCI-S)    . REDUCTION MAMMAPLASTY Bilateral 2017  . TONSILLECTOMY      Current Medications: Current Meds  Medication Sig  . aspirin EC 81 MG tablet Take 81 mg by mouth daily.  . carvedilol (COREG) 25 MG tablet Take 1 tablet (25 mg total) by mouth 2 (two) times daily.  . clobetasol ointment (TEMOVATE) 0.05 % Apply to affected area every night for 4 weeks, then every other day for 4 weeks and then twice a week for 4 weeks or until resolution.  . dapagliflozin propanediol (FARXIGA) 10 MG TABS tablet Take 1 tablet (10 mg total) by mouth daily.  . furosemide (LASIX) 40 MG tablet Take 1 tablet (40 mg total) by mouth daily as needed for fluid or edema.  Marland Kitchen glimepiride (AMARYL) 2 MG tablet Take 2 mg by mouth  daily with breakfast.  . icosapent Ethyl (VASCEPA) 1 g capsule Take 2 capsules (2 g total) by mouth 2 (two) times daily.  . metFORMIN (GLUCOPHAGE) 1000 MG tablet Take 500 mg by mouth 2 (two) times daily with a meal.  . rosuvastatin (CRESTOR) 40 MG tablet Take 1 tablet (40 mg total) by mouth daily at 6 PM. MUST SCHEDULE PHYSICAL  . sacubitril-valsartan (ENTRESTO) 97-103 MG Take 1 tablet by mouth 2 (two) times daily.  Marland Kitchen spironolactone (ALDACTONE) 25 MG tablet Take 1 tablet (25 mg total) by mouth daily.     Allergies:   Ticagrelor   Social History   Socioeconomic History  . Marital status: Widowed    Spouse name: Not on file  . Number  of children: Not on file  . Years of education: Not on file  . Highest education level: Not on file  Occupational History  . Not on file  Tobacco Use  . Smoking status: Former Smoker    Packs/day: 1.00    Years: 33.00    Pack years: 33.00  . Smokeless tobacco: Never Used  Vaping Use  . Vaping Use: Never used  Substance and Sexual Activity  . Alcohol use: Yes    Comment: occasional  . Drug use: Never  . Sexual activity: Not Currently    Birth control/protection: Post-menopausal  Other Topics Concern  . Not on file  Social History Narrative  . Not on file   Social Determinants of Health   Financial Resource Strain: Not on file  Food Insecurity: Not on file  Transportation Needs: Not on file  Physical Activity: Not on file  Stress: Not on file  Social Connections: Not on file     Family History: The patient's family history includes Breast cancer (age of onset: 71) in her sister; COPD in her father; Diabetes in her brother and sister; Heart attack in her mother; Thyroid cancer in her sister.  ROS:   Please see the history of present illness.     All other systems reviewed and are negative.  EKGs/Labs/Other Studies Reviewed:    The following studies were reviewed today: TTE 02-13-19 1. Left ventricular ejection fraction, by visual estimation, is 30 to 35%. The left ventricle has severely decreased function. There is no left ventricular hypertrophy.  2. Left ventricular diastolic parameters are consistent with Grade I diastolic dysfunction (impaired relaxation).  3. Mildly dilated left ventricular internal cavity size.  4. The left ventricle demonstrates global hypokinesis.  5. Global right ventricle has normal systolic function.The right ventricular size is normal. No increase in right ventricular wall thickness.  6. Left atrial size was normal.  7. Mild to moderate mitral valve regurgitation.  8. TR signal is inadequate for assessing pulmonary artery systolic  pressure.  9. A pacer wire is visualized in the RV.  EKG:  EKG not ordered today.   Recent Labs: 05/12/2020: ALT 15; BUN 26; Creatinine, Ser 1.09; Hemoglobin 12.3; Platelets 261.0; Potassium 4.2; Sodium 138  Recent Lipid Panel    Component Value Date/Time   CHOL 135 05/12/2020 1537   TRIG 307.0 (H) 05/12/2020 1537   HDL 44.40 05/12/2020 1537   CHOLHDL 3 05/12/2020 1537   VLDL 61.4 (H) 05/12/2020 1537   LDLCALC 56 10/20/2019 1223   LDLDIRECT 59.0 05/12/2020 1537    Physical Exam:    VS:  BP 90/62   Pulse 75   Ht 5' 3.25" (1.607 m)   Wt 189 lb (85.7 kg)   BMI 33.22 kg/m  Wt Readings from Last 3 Encounters:  06/22/20 189 lb (85.7 kg)  05/12/20 189 lb (85.7 kg)  04/30/20 191 lb 12.8 oz (87 kg)     GEN:  Well nourished, well developed in no acute distress HEENT: Normal NECK: No JVD; No carotid bruits LYMPHATICS: No lymphadenopathy CARDIAC: RRR, no murmurs, rubs, gallops RESPIRATORY:  Clear to auscultation without rales, wheezing or rhonchi  ABDOMEN: Soft, non-tender, non-distended MUSCULOSKELETAL:  No edema; No deformity  SKIN: Warm and dry NEUROLOGIC:  Alert and oriented x 3 PSYCHIATRIC:  Normal affect   ASSESSMENT:    1. Ischemic cardiomyopathy   2. Coronary artery disease involving native coronary artery of native heart without angina pectoris   3. ICD (implantable cardioverter-defibrillator) in place    PLAN:    In order of problems listed above:  1.  ischemic cardiomyopathy last EF 35-40%%, status post ICD.  NYHA class II symptoms.  Blood pressure is low normal.  Patient is on optimal CHF medications including Coreg 25 mg twice daily, Entresto 97-1 03, Aldactone 25 mg daily.  Continue Farxiga 10 mg daily.  Repeat echocardiogram in 3 months while patient is on optimal CHF medications. 2. History of CAD, PCI's, CABG x2.  No current symptoms of angina.  Continue aspirin 81 mg, Crestor 40 mg daily. 3. ICD in place for ischemic cardiomyopathy.  Keep device checks  appointment with EP.   Follow-up in 6 months  Total encounter time 40 minutes  Greater than 50% was spent in counseling and coordination of care with the patient   Medication Adjustments/Labs and Tests Ordered: Current medicines are reviewed at length with the patient today.  Concerns regarding medicines are outlined above.  Orders Placed This Encounter  Procedures  . ECHOCARDIOGRAM COMPLETE   No orders of the defined types were placed in this encounter.   Patient Instructions  Medication Instructions:  Your physician recommends that you continue on your current medications as directed. Please refer to the Current Medication list given to you today.  *If you need a refill on your cardiac medications before your next appointment, please call your pharmacy*   Lab Work: None ordered If you have labs (blood work) drawn today and your tests are completely normal, you will receive your results only by: Marland Kitchen MyChart Message (if you have MyChart) OR . A paper copy in the mail If you have any lab test that is abnormal or we need to change your treatment, we will call you to review the results.   Testing/Procedures:   Your physician has requested that you have an echocardiogram in 3 months. Echocardiography is a painless test that uses sound waves to create images of your heart. It provides your doctor with information about the size and shape of your heart and how well your heart's chambers and valves are working. This procedure takes approximately one hour. There are no restrictions for this procedure.     Follow-Up: At [facility], you and your health needs are our priority.  As part of our continuing mission to provide you with exceptional heart care, we have created designated Provider Care Teams.  These Care Teams include your primary Cardiologist (physician) and Advanced Practice Providers (APPs -  Physician Assistants and Nurse Practitioners) who all work together to provide  you with the care you need, when you need it.  We recommend signing up for the patient portal called "MyChart".  Sign up information is provided on this After Visit Summary.  MyChart is used to connect  with patients for Virtual Visits (Telemedicine).  Patients are able to view lab/test results, encounter notes, upcoming appointments, etc.  Non-urgent messages can be sent to your provider as well.   To learn more about what you can do with MyChart, go to [email or web address].    Your next appointment:   6 month(s)  The format for your next appointment:   In Person  Provider:   Debbe Odea, MD   Other Instructions      Signed, Debbe Odea, MD  06/22/2020 10:44 AM    [facility]

## 2020-06-26 ENCOUNTER — Encounter (HOSPITAL_COMMUNITY): Payer: Self-pay

## 2020-06-28 ENCOUNTER — Other Ambulatory Visit (HOSPITAL_COMMUNITY): Payer: Self-pay | Admitting: *Deleted

## 2020-06-28 MED ORDER — ICOSAPENT ETHYL 1 G PO CAPS: 2.0000 g | ORAL_CAPSULE | Freq: Two times a day (BID) | ORAL | 3 refills | Status: DC

## 2020-07-01 ENCOUNTER — Ambulatory Visit: Payer: 59 | Admitting: Obstetrics and Gynecology

## 2020-07-02 ENCOUNTER — Other Ambulatory Visit: Payer: Self-pay

## 2020-07-02 ENCOUNTER — Ambulatory Visit (INDEPENDENT_AMBULATORY_CARE_PROVIDER_SITE_OTHER): Payer: Medicare Other | Admitting: Obstetrics and Gynecology

## 2020-07-02 ENCOUNTER — Encounter: Payer: Self-pay | Admitting: Obstetrics and Gynecology

## 2020-07-02 ENCOUNTER — Other Ambulatory Visit: Payer: Self-pay | Admitting: Obstetrics and Gynecology

## 2020-07-02 VITALS — BP 120/70 | Ht 63.0 in | Wt 191.2 lb

## 2020-07-02 DIAGNOSIS — N904 Leukoplakia of vulva: Secondary | ICD-10-CM

## 2020-07-02 DIAGNOSIS — R350 Frequency of micturition: Secondary | ICD-10-CM

## 2020-07-02 DIAGNOSIS — R875 Abnormal microbiological findings in specimens from female genital organs: Secondary | ICD-10-CM | POA: Diagnosis not present

## 2020-07-02 DIAGNOSIS — R300 Dysuria: Secondary | ICD-10-CM | POA: Diagnosis not present

## 2020-07-02 LAB — POCT URINALYSIS DIPSTICK
Bilirubin, UA: NEGATIVE
Blood, UA: POSITIVE
Glucose, UA: POSITIVE — AB
Ketones, UA: NEGATIVE
Nitrite, UA: NEGATIVE
Protein, UA: POSITIVE — AB
Spec Grav, UA: >=1.03 — AB (ref 1.010–1.025)
Urobilinogen, UA: 1 E.U./dL
pH, UA: 5 (ref 5.0–8.0)

## 2020-07-02 MED ORDER — CLOBETASOL PROPIONATE 0.05 % EX OINT: TOPICAL_OINTMENT | CUTANEOUS | 5 refills | Status: AC

## 2020-07-02 MED ORDER — OXYBUTYNIN CHLORIDE ER 5 MG PO TB24: 5.0000 mg | ORAL_TABLET | Freq: Every day | ORAL | 1 refills | Status: DC

## 2020-07-02 NOTE — Progress Notes (Signed)
Patient ID: Monique Franklin, female   DOB: [birth year], 65 y.o.   MRN: [id number]  Reason for Consult: Gynecologic Exam   Referred by Lorre Munroe, NP  Subjective:     HPI:  Monique Franklin is a 65 y.o. female.   She has been using clobetasol 4 times a week for itching. She reports she still has a lot of persistent skin irritation Her urinary frequency has become increasingly difficult. She urinates nearly every hour while awake.  She also wakes 3 times at night to urinate.  She was seen previously by urogynecology but premarin ointment was recommended and she is not comfortable using hormonal medication.    Past Medical History:  Diagnosis Date  . Cardiomyopathy (HCC)   . CHF (congestive heart failure) (HCC)    Class I  . Coronary artery disease   . Diabetes mellitus without complication (HCC)   . GERD (gastroesophageal reflux disease)   . HFrEF (heart failure with reduced ejection fraction) (HCC)   . Hyperlipidemia   . Hypertension   . MI (myocardial infarction) (HCC)   . PAD (peripheral artery disease) (HCC)    Family History  Problem Relation Age of Onset  . Heart attack Mother   . COPD Father   . Thyroid cancer Sister   . Breast cancer Sister 48  . Diabetes Sister   . Diabetes Brother    Past Surgical History:  Procedure Laterality Date  . BREAST BIOPSY Bilateral 2016   neg  . CARDIAC CATHETERIZATION    . CARDIAC DEFIBRILLATOR PLACEMENT    . COLONOSCOPY WITH PROPOFOL N/A 10/31/2019   Procedure: COLONOSCOPY WITH PROPOFOL;  Surgeon: Pasty Spillers, MD;  Location: [facility] ENDOSCOPY;  Service: Endoscopy;  Laterality: N/A;  priority 4  . COMBINED AUGMENTATION MAMMAPLASTY AND ABDOMINOPLASTY    . CORONARY ARTERY BYPASS GRAFT  08/2017   X2 with LIMA to LAD; 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;Severe 3 vessel CAD-PCI to LAD and LC x(5stents) Coronary dissection of LM to LAD   . CORONARY ARTERY BYPASS GRAFT    . CORONARY STENT PLACEMENT    . PERCUTANEOUS CORONARY STENT  INTERVENTION (PCI-S)    . REDUCTION MAMMAPLASTY Bilateral 2017  . TONSILLECTOMY      Short Social History:  Social History   Tobacco Use  . Smoking status: Former Smoker    Packs/day: 1.00    Years: 33.00    Pack years: 33.00  . Smokeless tobacco: Never Used  Substance Use Topics  . Alcohol use: Yes    Comment: occasional    Allergies  Allergen Reactions  . Ticagrelor Rash    Current Outpatient Medications  Medication Sig Dispense Refill  . aspirin EC 81 MG tablet Take 81 mg by mouth daily.    . carvedilol (COREG) 25 MG tablet Take 1 tablet (25 mg total) by mouth 2 (two) times daily. 180 tablet 3  . dapagliflozin propanediol (FARXIGA) 10 MG TABS tablet Take 1 tablet (10 mg total) by mouth daily. 90 tablet 0  . furosemide (LASIX) 40 MG tablet Take 1 tablet (40 mg total) by mouth daily as needed for fluid or edema. 90 tablet 0  . glimepiride (AMARYL) 2 MG tablet Take 2 mg by mouth daily with breakfast.    . icosapent Ethyl (VASCEPA) 1 g capsule Take 2 capsules (2 g total) by mouth 2 (two) times daily. 360 capsule 3  . metFORMIN (GLUCOPHAGE) 1000 MG tablet Take 500 mg by mouth 2 (two) times daily with a meal.    .  oxybutynin (DITROPAN-XL) 5 MG 24 hr tablet Take 1 tablet (5 mg total) by mouth at bedtime. 30 tablet 1  . rosuvastatin (CRESTOR) 40 MG tablet Take 1 tablet (40 mg total) by mouth daily at 6 PM. MUST SCHEDULE PHYSICAL 90 tablet 0  . sacubitril-valsartan (ENTRESTO) 97-103 MG Take 1 tablet by mouth 2 (two) times daily. 180 tablet 3  . spironolactone (ALDACTONE) 25 MG tablet Take 1 tablet (25 mg total) by mouth daily. 90 tablet 3  . clobetasol ointment (TEMOVATE) 0.05 % Apply to affected area every night for 4 weeks, then every other day for 4 weeks and then twice a week for 4 weeks or until resolution. 30 g 5   No current facility-administered medications for this visit.    Review of Systems  Constitutional: Negative for chills, fatigue, fever and unexpected weight  change.  HENT: Negative for trouble swallowing.  Eyes: Negative for loss of vision.  Respiratory: Negative for cough, shortness of breath and wheezing.  Cardiovascular: Negative for chest pain, leg swelling, palpitations and syncope.  GI: Negative for abdominal pain, blood in stool, diarrhea, nausea and vomiting.  GU: Negative for difficulty urinating, dysuria, frequency and hematuria.  Musculoskeletal: Negative for back pain, leg pain and joint pain.  Skin: Negative for rash.  Neurological: Negative for dizziness, headaches, light-headedness, numbness and seizures.  Psychiatric: Negative for behavioral problem, confusion, depressed mood and sleep disturbance.        Objective:  Objective   Vitals:   07/02/20 0910  BP: 120/70  Weight: 191 lb 3.2 oz (86.7 kg)  Height: 5\' 3"  (1.6 m)   Body mass index is 33.87 kg/m.  Physical Exam Vitals and nursing note reviewed. Exam conducted with a chaperone present.  Constitutional:      Appearance: Normal appearance. She is well-developed.  HENT:     Head: Normocephalic and atraumatic.  Eyes:     Extraocular Movements: Extraocular movements intact.     Pupils: Pupils are equal, round, and reactive to light.  Cardiovascular:     Rate and Rhythm: Normal rate and regular rhythm.  Pulmonary:     Effort: Pulmonary effort is normal. No respiratory distress.     Breath sounds: Normal breath sounds.  Abdominal:     General: Abdomen is flat.     Palpations: Abdomen is soft.  Genitourinary:   Musculoskeletal:        General: No signs of injury.  Skin:    General: Skin is warm and dry.  Neurological:     Mental Status: She is alert and oriented to person, place, and time.  Psychiatric:        Behavior: Behavior normal.        Thought Content: Thought content normal.        Judgment: Judgment normal.     Assessment/Plan:     65 yo with lichen sclerosis and urinary frequency.  1. Lichen scloerisis- ongoing use of clobetasol, try  to decrease to 1-2 times a week. Discussed addition  of a topical skin protectant.  2. Will start ditropan for urinary frequency. Reviewed possible side effects of this medication.   Check urine culture Check for yeast infection  More than 20 minutes were spent face to face with the patient in the room, reviewing the medical record, labs and images, and coordinating care for the patient. The plan of management was discussed in detail and counseling was provided.      76 MD [facility], [facility]  07/02/2020 9:52 AM

## 2020-07-02 NOTE — Telephone Encounter (Signed)
Please see RX Request 

## 2020-07-02 NOTE — Patient Instructions (Signed)
Urinary Frequency, Adult Urinary frequency means urinating more often than usual. You may urinate every 1-2 hours even though you drink a normal amount of fluid and do not have a bladder infection or condition. Although you urinate more often than normal,the total amount of urine produced in a day is normal. With urinary frequency, you may have an urgent need to urinate often. The stress and anxiety of needing to find a bathroom quickly can make this urge worse. This condition may go away on its own or you may need treatment at home. Home treatment may include bladder training, exercises, taking medicines, ormaking changes to your diet. Follow these instructions at home: Bladder health  Keep a bladder diary if told by your health care provider. Keep track of: What you eat and drink. How often you urinate. How much you urinate. Follow a bladder training program if told by your health care provider. This may include: Learning to delay going to the bathroom. Double urinating (voiding). This helps if you are not completely emptying your bladder. Scheduled voiding. Do Kegel exercises as told by your health care provider. Kegel exercises strengthen the muscles that help control urination, which may help the condition.  Eating and drinking If told by your health care provider, make diet changes, such as: Avoiding caffeine. Drinking fewer fluids, especially alcohol. Not drinking in the evening. Avoiding foods or drinks that may irritate the bladder. These include coffee, tea, soda, artificial sweeteners, citrus, tomato-based foods, and chocolate. Eating foods that help prevent or ease constipation. Constipation can make this condition worse. Your health care provider may recommend that you: Drink enough fluid to keep your urine pale yellow. Take over-the-counter or prescription medicines. Eat foods that are high in fiber, such as beans, whole grains, and fresh fruits and vegetables. Limit foods  that are high in fat and processed sugars, such as fried or sweet foods. General instructions Take over-the-counter and prescription medicines only as told by your health care provider. Keep all follow-up visits as told by your health care provider. This is important. Contact a health care provider if: You start urinating more often. You feel pain or irritation when you urinate. You notice blood in your urine. Your urine looks cloudy. You develop a fever. You begin vomiting. Get help right away if: You are unable to urinate. Summary Urinary frequency means urinating more often than usual. With urinary frequency, you may urinate every 1-2 hours even though you drink a normal amount of fluid and do not have a bladder infection or other bladder condition. Your health care provider may recommend that you keep a bladder diary, follow a bladder training program, or make dietary changes. If told by your health care provider, do Kegel exercises to strengthen the muscles that help control urination. Take over-the-counter and prescription medicines only as told by your health care provider. Contact a health care provider if your symptoms do not improve or get worse. This information is not intended to replace advice given to you by your health care provider. Make sure you discuss any questions you have with your healthcare provider. Document Revised: 10/04/2017 Document Reviewed: 10/04/2017 Elsevier Patient Education  2021 Elsevier Inc.  

## 2020-07-05 LAB — NUSWAB BV AND CANDIDA, NAA
Candida albicans, NAA: NEGATIVE
Candida glabrata, NAA: NEGATIVE

## 2020-07-05 LAB — URINE CULTURE: Organism ID, Bacteria: NO GROWTH

## 2020-07-26 ENCOUNTER — Ambulatory Visit (INDEPENDENT_AMBULATORY_CARE_PROVIDER_SITE_OTHER): Payer: Medicare Other

## 2020-07-26 DIAGNOSIS — I255 Ischemic cardiomyopathy: Secondary | ICD-10-CM | POA: Diagnosis not present

## 2020-07-28 LAB — CUP PACEART REMOTE DEVICE CHECK
Battery Remaining Longevity: 80 mo
Battery Remaining Percentage: 78 %
Battery Voltage: 2.99 V
Brady Statistic RV Percent Paced: 1 %
Date Time Interrogation Session: 20220418020016
HighPow Impedance: 80 Ohm
HighPow Impedance: 80 Ohm
Implantable Lead Implant Date: 20200707
Implantable Lead Location: 753860
Implantable Pulse Generator Implant Date: 20200707
Lead Channel Impedance Value: 700 Ohm
Lead Channel Pacing Threshold Amplitude: 0.5 V
Lead Channel Pacing Threshold Pulse Width: 0.5 ms
Lead Channel Sensing Intrinsic Amplitude: 11.7 mV
Lead Channel Setting Pacing Amplitude: 2.5 V
Lead Channel Setting Pacing Pulse Width: 0.5 ms
Lead Channel Setting Sensing Sensitivity: 0.5 mV
Pulse Gen Serial Number: 9816687

## 2020-07-30 ENCOUNTER — Other Ambulatory Visit: Payer: Self-pay

## 2020-07-30 ENCOUNTER — Ambulatory Visit (INDEPENDENT_AMBULATORY_CARE_PROVIDER_SITE_OTHER): Payer: Medicare Other | Admitting: Obstetrics and Gynecology

## 2020-07-30 ENCOUNTER — Encounter: Payer: Self-pay | Admitting: Obstetrics and Gynecology

## 2020-07-30 VITALS — BP 132/72 | Ht 63.0 in | Wt 187.4 lb

## 2020-07-30 DIAGNOSIS — N904 Leukoplakia of vulva: Secondary | ICD-10-CM

## 2020-07-30 DIAGNOSIS — R350 Frequency of micturition: Secondary | ICD-10-CM

## 2020-07-30 NOTE — Progress Notes (Signed)
Patient ID: Monique Franklin, female   DOB: 1955-10-30, 65 y.o.   MRN: [id number]  Reason for Consult: Gynecologic Exam   Referred by Lorre Munroe, NP  Subjective:     HPI:  Monique Franklin is a 65 y.o. female . She is very happy with starting ditropan. She reports that her symptoms of urinary frequency had totally resolved. She doe snot have to wake up as much at night. She is no longer having accidents or needing to wear an incontinence pad. She is having less symptoms of vulvar itching. She uses clobetasol when itching arises and this improves that symptoms. She reports using clobetasol about 3 times a week.    Past Medical History:  Diagnosis Date  . Cardiomyopathy (HCC)   . CHF (congestive heart failure) (HCC)    Class I  . Coronary artery disease   . Diabetes mellitus without complication (HCC)   . GERD (gastroesophageal reflux disease)   . HFrEF (heart failure with reduced ejection fraction) (HCC)   . Hyperlipidemia   . Hypertension   . MI (myocardial infarction) (HCC)   . PAD (peripheral artery disease) (HCC)    Family History  Problem Relation Age of Onset  . Heart attack Mother   . COPD Father   . Thyroid cancer Sister   . Breast cancer Sister 68  . Diabetes Sister   . Diabetes Brother    Past Surgical History:  Procedure Laterality Date  . BREAST BIOPSY Bilateral 2016   neg  . CARDIAC CATHETERIZATION    . CARDIAC DEFIBRILLATOR PLACEMENT    . COLONOSCOPY WITH PROPOFOL N/A 10/31/2019   Procedure: COLONOSCOPY WITH PROPOFOL;  Surgeon: Pasty Spillers, MD;  Location: [facility] ENDOSCOPY;  Service: Endoscopy;  Laterality: N/A;  priority 4  . COMBINED AUGMENTATION MAMMAPLASTY AND ABDOMINOPLASTY    . CORONARY ARTERY BYPASS GRAFT  08/2017   X2 with LIMA to LAD; 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;Severe 3 vessel CAD-PCI to LAD and LC x(5stents) Coronary dissection of LM to LAD   . CORONARY ARTERY BYPASS GRAFT    . CORONARY STENT PLACEMENT    . PERCUTANEOUS CORONARY STENT  INTERVENTION (PCI-S)    . REDUCTION MAMMAPLASTY Bilateral 2017  . TONSILLECTOMY      Short Social History:  Social History   Tobacco Use  . Smoking status: Former Smoker    Packs/day: 1.00    Years: 33.00    Pack years: 33.00  . Smokeless tobacco: Never Used  Substance Use Topics  . Alcohol use: Yes    Comment: occasional    Allergies  Allergen Reactions  . Ticagrelor Rash    Current Outpatient Medications  Medication Sig Dispense Refill  . aspirin EC 81 MG tablet Take 81 mg by mouth daily.    . carvedilol (COREG) 25 MG tablet Take 1 tablet (25 mg total) by mouth 2 (two) times daily. 180 tablet 3  . clobetasol ointment (TEMOVATE) 0.05 % Apply to affected area every night for 4 weeks, then every other day for 4 weeks and then twice a week for 4 weeks or until resolution. 30 g 5  . dapagliflozin propanediol (FARXIGA) 10 MG TABS tablet Take 1 tablet (10 mg total) by mouth daily. 90 tablet 0  . furosemide (LASIX) 40 MG tablet Take 1 tablet (40 mg total) by mouth daily as needed for fluid or edema. 90 tablet 0  . glimepiride (AMARYL) 2 MG tablet Take 2 mg by mouth daily with breakfast.    . icosapent Ethyl (VASCEPA)  1 g capsule Take 2 capsules (2 g total) by mouth 2 (two) times daily. 360 capsule 3  . metFORMIN (GLUCOPHAGE) 1000 MG tablet Take 500 mg by mouth 2 (two) times daily with a meal.    . oxybutynin (DITROPAN-XL) 5 MG 24 hr tablet TAKE 1 TABLET(5 MG) BY MOUTH AT BEDTIME 90 tablet 3  . rosuvastatin (CRESTOR) 40 MG tablet Take 1 tablet (40 mg total) by mouth daily at 6 PM. MUST SCHEDULE PHYSICAL 90 tablet 0  . sacubitril-valsartan (ENTRESTO) 97-103 MG Take 1 tablet by mouth 2 (two) times daily. 180 tablet 3  . spironolactone (ALDACTONE) 25 MG tablet Take 1 tablet (25 mg total) by mouth daily. 90 tablet 3   No current facility-administered medications for this visit.    Review of Systems  Constitutional: Negative for chills, fatigue, fever and unexpected weight change.   HENT: Negative for trouble swallowing.  Eyes: Negative for loss of vision.  Respiratory: Negative for cough, shortness of breath and wheezing.  Cardiovascular: Negative for chest pain, leg swelling, palpitations and syncope.  GI: Negative for abdominal pain, blood in stool, diarrhea, nausea and vomiting.  GU: Negative for difficulty urinating, dysuria, frequency and hematuria.  Musculoskeletal: Negative for back pain, leg pain and joint pain.  Skin: Negative for rash.  Neurological: Negative for dizziness, headaches, light-headedness, numbness and seizures.  Psychiatric: Negative for behavioral problem, confusion, depressed mood and sleep disturbance.        Objective:  Objective   Vitals:   07/30/20 1030  BP: 132/72  Weight: 187 lb 6.4 oz (85 kg)  Height: 5\' 3"  (1.6 m)   Body mass index is 33.2 kg/m.  Physical Exam Vitals and nursing note reviewed. Exam conducted with a chaperone present.  Constitutional:      Appearance: Normal appearance.  HENT:     Head: Normocephalic and atraumatic.  Eyes:     Extraocular Movements: Extraocular movements intact.     Pupils: Pupils are equal, round, and reactive to light.  Cardiovascular:     Rate and Rhythm: Normal rate and regular rhythm.  Pulmonary:     Effort: Pulmonary effort is normal.     Breath sounds: Normal breath sounds.  Abdominal:     General: Abdomen is flat.     Palpations: Abdomen is soft.  Musculoskeletal:     Cervical back: Normal range of motion.  Skin:    General: Skin is warm and dry.  Neurological:     General: No focal deficit present.     Mental Status: She is alert and oriented to person, place, and time.  Psychiatric:        Behavior: Behavior normal.        Thought Content: Thought content normal.        Judgment: Judgment normal.     Assessment/Plan:     65 yo  1. Lichen sclerosis- move to monitoring every 6 months. Decerease usage to once a week.   2. Continue ditropan for urinary  frequency.   More than 10 minutes were spent face to face with the patient in the room, reviewing the medical record, labs and images, and coordinating care for the patient. The plan of management was discussed in detail and counseling was provided.    76 MD [facility], [facility] 07/30/2020 10:51 AM

## 2020-08-03 ENCOUNTER — Encounter (HOSPITAL_COMMUNITY): Payer: Self-pay

## 2020-08-03 ENCOUNTER — Other Ambulatory Visit (HOSPITAL_COMMUNITY): Payer: Self-pay | Admitting: *Deleted

## 2020-08-03 MED ORDER — ENTRESTO 97-103 MG PO TABS: 1.0000 | ORAL_TABLET | Freq: Two times a day (BID) | ORAL | 3 refills | Status: DC

## 2020-08-03 MED ORDER — ROSUVASTATIN CALCIUM 40 MG PO TABS: 40.0000 mg | ORAL_TABLET | Freq: Every day | ORAL | 3 refills | Status: DC

## 2020-08-03 MED ORDER — DAPAGLIFLOZIN PROPANEDIOL 10 MG PO TABS: 10.0000 mg | ORAL_TABLET | Freq: Every day | ORAL | 3 refills | Status: DC

## 2020-08-05 ENCOUNTER — Other Ambulatory Visit (HOSPITAL_COMMUNITY): Payer: Self-pay | Admitting: *Deleted

## 2020-08-05 MED ORDER — ROSUVASTATIN CALCIUM 40 MG PO TABS: 40.0000 mg | ORAL_TABLET | Freq: Every day | ORAL | 3 refills | Status: DC

## 2020-08-05 MED ORDER — ENTRESTO 97-103 MG PO TABS: 1.0000 | ORAL_TABLET | Freq: Two times a day (BID) | ORAL | 3 refills | Status: DC

## 2020-08-05 MED ORDER — DAPAGLIFLOZIN PROPANEDIOL 10 MG PO TABS: 10.0000 mg | ORAL_TABLET | Freq: Every day | ORAL | 3 refills | Status: DC

## 2020-08-10 NOTE — Progress Notes (Signed)
Remote ICD transmission.   

## 2020-08-31 ENCOUNTER — Other Ambulatory Visit: Payer: Self-pay

## 2020-08-31 ENCOUNTER — Encounter (HOSPITAL_COMMUNITY): Payer: Self-pay | Admitting: Cardiology

## 2020-08-31 ENCOUNTER — Ambulatory Visit (HOSPITAL_COMMUNITY)
Admission: RE | Admit: 2020-08-31 | Discharge: 2020-08-31 | Disposition: A | Discharge status: Home / Self Care | Payer: Medicare Other | Source: Ambulatory Visit | Attending: Cardiology | Admitting: Cardiology

## 2020-08-31 VITALS — BP 102/60 | HR 73 | Wt 190.6 lb

## 2020-08-31 DIAGNOSIS — Z95810 Presence of automatic (implantable) cardiac defibrillator: Secondary | ICD-10-CM | POA: Diagnosis not present

## 2020-08-31 DIAGNOSIS — I340 Nonrheumatic mitral (valve) insufficiency: Secondary | ICD-10-CM | POA: Insufficient documentation

## 2020-08-31 DIAGNOSIS — Z7984 Long term (current) use of oral hypoglycemic drugs: Secondary | ICD-10-CM | POA: Insufficient documentation

## 2020-08-31 DIAGNOSIS — Z955 Presence of coronary angioplasty implant and graft: Secondary | ICD-10-CM | POA: Insufficient documentation

## 2020-08-31 DIAGNOSIS — I2510 Atherosclerotic heart disease of native coronary artery without angina pectoris: Secondary | ICD-10-CM | POA: Diagnosis not present

## 2020-08-31 DIAGNOSIS — Z951 Presence of aortocoronary bypass graft: Secondary | ICD-10-CM | POA: Diagnosis not present

## 2020-08-31 DIAGNOSIS — I5022 Chronic systolic (congestive) heart failure: Secondary | ICD-10-CM | POA: Diagnosis not present

## 2020-08-31 DIAGNOSIS — I252 Old myocardial infarction: Secondary | ICD-10-CM | POA: Diagnosis not present

## 2020-08-31 DIAGNOSIS — I110 Hypertensive heart disease with heart failure: Secondary | ICD-10-CM | POA: Diagnosis present

## 2020-08-31 DIAGNOSIS — Z7982 Long term (current) use of aspirin: Secondary | ICD-10-CM | POA: Diagnosis not present

## 2020-08-31 DIAGNOSIS — Z79899 Other long term (current) drug therapy: Secondary | ICD-10-CM | POA: Insufficient documentation

## 2020-08-31 DIAGNOSIS — Z8249 Family history of ischemic heart disease and other diseases of the circulatory system: Secondary | ICD-10-CM | POA: Diagnosis not present

## 2020-08-31 DIAGNOSIS — E7800 Pure hypercholesterolemia, unspecified: Secondary | ICD-10-CM | POA: Diagnosis not present

## 2020-08-31 DIAGNOSIS — I255 Ischemic cardiomyopathy: Secondary | ICD-10-CM | POA: Diagnosis not present

## 2020-08-31 DIAGNOSIS — E785 Hyperlipidemia, unspecified: Secondary | ICD-10-CM | POA: Diagnosis not present

## 2020-08-31 LAB — BASIC METABOLIC PANEL
Anion gap: 8 (ref 5–15)
BUN: 24 mg/dL — ABNORMAL HIGH (ref 8–23)
CO2: 22 mmol/L (ref 22–32)
Calcium: 9.6 mg/dL (ref 8.9–10.3)
Chloride: 106 mmol/L (ref 98–111)
Creatinine, Ser: 1.15 mg/dL — ABNORMAL HIGH (ref 0.44–1.00)
GFR, Estimated: 53 mL/min — ABNORMAL LOW (ref >60)
Glucose, Bld: 262 mg/dL — ABNORMAL HIGH (ref 70–99)
Potassium: 4.5 mmol/L (ref 3.5–5.1)
Sodium: 136 mmol/L (ref 135–145)

## 2020-08-31 LAB — LIPID PANEL
Cholesterol: 137 mg/dL (ref 0–200)
HDL: 44 mg/dL (ref >40)
LDL Cholesterol: 40 mg/dL (ref 0–99)
Total CHOL/HDL Ratio: 3.1 RATIO
Triglycerides: 266 mg/dL — ABNORMAL HIGH (ref <150)
VLDL: 53 mg/dL — ABNORMAL HIGH (ref 0–40)

## 2020-08-31 NOTE — Patient Instructions (Addendum)
Labs done today. We will contact you only if your labs are abnormal.  No medication changes were made. Please continue all current medications as prescribed.  Your physician recommends that you schedule a follow-up appointment in: November for an appointment with Dr. Shirlee Latch with an echo prior to your exam. Please contact our office in October to schedule a November appointment.   Your physician has requested that you have an echocardiogram. Echocardiography is a painless test that uses sound waves to create images of your heart. It provides your doctor with information about the size and shape of your heart and how well your heart's chambers and valves are working. This procedure takes approximately one hour. There are no restrictions for this procedure.  If you have any questions or concerns before your next appointment please send [place] a message through [place]ada or call our office at [phone].    TO LEAVE A MESSAGE FOR THE NURSE SELECT OPTION 2, PLEASE LEAVE A MESSAGE INCLUDING: . YOUR NAME . DATE OF BIRTH . CALL BACK NUMBER . REASON FOR CALL**this is important as we prioritize the call backs  YOU WILL RECEIVE A CALL BACK THE SAME DAY AS LONG AS YOU CALL BEFORE 4:00 PM   Do the following things EVERYDAY: 1) Weigh yourself in the morning before breakfast. Write it down and keep it in a log. 2) Take your medicines as prescribed 3) Eat low salt foods--Limit salt (sodium) to 2000 mg per day.  4) Stay as active as you can everyday 5) Limit all fluids for the day to less than 2 liters   At the [facility], you and your health needs are our priority. As part of our continuing mission to provide you with exceptional heart care, we have created designated Provider Care Teams. These Care Teams include your primary Cardiologist (physician) and Advanced Practice Providers (APPs- Physician Assistants and Nurse Practitioners) who all work together to provide you with the care you  need, when you need it.   You may see any of the following providers on your designated Care Team at your next follow up: Marland Kitchen Dr Arvilla Meres . Dr Marca Ancona . Tonye Becket, NP . Robbie Lis, PA . Karle Plumber, PharmD   Please be sure to bring in all your medications bottles to every appointment.

## 2020-08-31 NOTE — Progress Notes (Signed)
PCP: Lorre Munroe, NP  Cardiology: Dr. Azucena Cecil HF Cardiology: Dr. Shirlee Latch  65 y.o. with CAD and ischemic cardiomyopathy was referred by Dr. Azucena Cecil for evaluation of CHF.  She lived in [place] in the past and had her initial cardiology care there.  In 4/19, she had PCI to LAD and LCx with 5 stents. She then had an MI in 5/19 and had CABG with LIMA-LAD and SVG-OM.  She developed an ischemic cardiomyopathy and had a St Jude ICD placed.  She moved to [facility] and has been followed there more recently.  Echo in 10/20 showed EF 30-35%, mild LV dilation, mild-moderate MR.  Echo in 10/21 showed EF 35-40%.      She returns for followup of CHF.  She has been doing well, no dyspnea walking on flat ground.  Walks for exercises and works out with [employer].  Weight down 1 lb.  No orthopnea/PND.  No chest pain. No lightheadedness.   St Jude device interrogation: Stable thoracic impedance.   Labs (1/21): K 4.5, creatinine 1.09, LDL 54, TGs 196 Labs (4/21): K 4.2, creatinine 0.94 Labs (7/21): K 4.5, creatinine 0.96, LDL 56, HDl 57 Labs (10/21) K 4.5, creatinine 1.02 Labs (2/22): creatinine 1.09, LDL 59, TGs 301  PMH: 1. HTN 2. Type 2 diabetes 3. CAD: PCI to LAD and LCx in 4/19, 5 stents altogether.  - MI in 5/19 with CABG (LIMA-LAD, SVG-OM).  4. Hyperlipidemia 5. Chronic systolic CHF: Ischemic cardiomyopathy.   - St Jude ICD.  - Echo (10/20): EF 30-35%, mildly dilated LV, normal RV, mild to moderate MR.  - Echo (10/21): EF 35-40%, diffuse hypokinesis, normal RV 6. Left femoral thrombendarterectomy with patch angioplasty in 9/19.  Complication of Impella.   FH: Mother with CABG in 109s, MI in 79s.   SH: Widowed, lives in [place], nonsmoker. She has children locally.  Not working.   ROS: All systems reviewed and negative except as per HPI.   Current Outpatient Medications  Medication Sig Dispense Refill  . aspirin EC 81 MG tablet Take 81 mg by mouth daily.    . carvedilol  (COREG) 25 MG tablet Take 1 tablet (25 mg total) by mouth 2 (two) times daily. 180 tablet 3  . clobetasol ointment (TEMOVATE) 0.05 % Apply to affected area every night for 4 weeks, then every other day for 4 weeks and then twice a week for 4 weeks or until resolution. 30 g 5  . dapagliflozin propanediol (FARXIGA) 10 MG TABS tablet Take 1 tablet (10 mg total) by mouth daily. 90 tablet 3  . furosemide (LASIX) 40 MG tablet Take 1 tablet (40 mg total) by mouth daily as needed for fluid or edema. 90 tablet 0  . glimepiride (AMARYL) 2 MG tablet Take 2 mg by mouth daily with breakfast.    . icosapent Ethyl (VASCEPA) 1 g capsule Take 2 capsules (2 g total) by mouth 2 (two) times daily. 360 capsule 3  . metFORMIN (GLUCOPHAGE) 1000 MG tablet Take 500 mg by mouth 2 (two) times daily with a meal.    . oxybutynin (DITROPAN-XL) 5 MG 24 hr tablet TAKE 1 TABLET(5 MG) BY MOUTH AT BEDTIME 90 tablet 3  . rosuvastatin (CRESTOR) 40 MG tablet Take 1 tablet (40 mg total) by mouth daily at 6 PM. 90 tablet 3  . sacubitril-valsartan (ENTRESTO) 97-103 MG Take 1 tablet by mouth 2 (two) times daily. 180 tablet 3  . spironolactone (ALDACTONE) 25 MG tablet Take 1 tablet (25 mg total) by  mouth daily. 90 tablet 3   No current facility-administered medications for this encounter.   BP 102/60   Pulse 73   Wt 86.5 kg (190 lb 9.6 oz)   SpO2 97%   BMI 33.76 kg/m  General: NAD Neck: No JVD, no thyromegaly or thyroid nodule.  Lungs: Clear to auscultation bilaterally with normal respiratory effort. CV: Nondisplaced PMI.  Heart regular S1/S2, no S3/S4, no murmur.  No peripheral edema.  No carotid bruit.  Normal pedal pulses.  Abdomen: Soft, nontender, no hepatosplenomegaly, no distention.  Skin: Intact without lesions or rashes.  Neurologic: Alert and oriented x 3.  Psych: Normal affect. Extremities: No clubbing or cyanosis.  HEENT: Normal.   Assessment/Plan: 1. Chronic systolic CHF: Ischemic cardiomyopathy.  St Jude ICD.   Echo (10/20) with EF 30-35%, mild-moderate MR.  Echo in 10/21 with EF 35-40%.   She is not volume overloaded by exam.  NYHA class II symptoms.  - Continue spironolactone 25 mg daily.  BMET today.  - Continue Coreg 25 mg bid. - Continue Entresto 97/103 bid    - Has not needed Lasix.  - Continue dapagliflozin 10 mg daily.  - I will get echo at followup appointment in 10/22.  2. CAD: s/p CABG, no chest pain.  - Continue ASA 81 daily.  - Continue Crestor 40 daily, good LDL in 2/22.  3. Hyperlipidemia: She is on Crestor and Vascepa.   - Recheck lipids, if triglycerides still high, can use Vascepa.   Followup in 10/22 with echo.  Marca Ancona 08/31/2020

## 2020-09-21 ENCOUNTER — Other Ambulatory Visit: Payer: Medicare Other

## 2020-09-30 LAB — HM DIABETES EYE EXAM

## 2020-10-02 ENCOUNTER — Encounter: Payer: Self-pay | Admitting: Internal Medicine

## 2020-10-04 NOTE — Telephone Encounter (Signed)
Please review.  Monique Franklin has not been in to see you since 05/12/2020 at [facility]

## 2020-10-06 ENCOUNTER — Telehealth: Payer: Self-pay

## 2020-10-06 NOTE — Telephone Encounter (Signed)
Copied from CRM [id number]. Topic: General - Other >> Oct 05, 2020 12:28 PM Mcneil, Ja-Kwan wrote: Reason for CRM: Pt stated she sent a [facility] message requesting a new script but she has not heard back from anyone. Pt request call back,.

## 2020-10-13 ENCOUNTER — Encounter: Payer: Self-pay | Admitting: Internal Medicine

## 2020-10-13 ENCOUNTER — Other Ambulatory Visit: Payer: Self-pay

## 2020-10-13 ENCOUNTER — Ambulatory Visit (INDEPENDENT_AMBULATORY_CARE_PROVIDER_SITE_OTHER): Payer: Medicare Other | Admitting: Internal Medicine

## 2020-10-13 VITALS — BP 112/50 | HR 64 | Temp 97.1°F | Resp 17 | Ht 63.0 in | Wt 188.0 lb

## 2020-10-13 DIAGNOSIS — I428 Other cardiomyopathies: Secondary | ICD-10-CM | POA: Diagnosis not present

## 2020-10-13 DIAGNOSIS — E785 Hyperlipidemia, unspecified: Secondary | ICD-10-CM

## 2020-10-13 DIAGNOSIS — N1831 Chronic kidney disease, stage 3a: Secondary | ICD-10-CM

## 2020-10-13 DIAGNOSIS — Z23 Encounter for immunization: Secondary | ICD-10-CM | POA: Diagnosis not present

## 2020-10-13 DIAGNOSIS — N183 Chronic kidney disease, stage 3 unspecified: Secondary | ICD-10-CM | POA: Insufficient documentation

## 2020-10-13 DIAGNOSIS — Z6833 Body mass index (BMI) 33.0-33.9, adult: Secondary | ICD-10-CM

## 2020-10-13 DIAGNOSIS — I739 Peripheral vascular disease, unspecified: Secondary | ICD-10-CM

## 2020-10-13 DIAGNOSIS — I10 Essential (primary) hypertension: Secondary | ICD-10-CM

## 2020-10-13 DIAGNOSIS — I219 Acute myocardial infarction, unspecified: Secondary | ICD-10-CM

## 2020-10-13 DIAGNOSIS — K219 Gastro-esophageal reflux disease without esophagitis: Secondary | ICD-10-CM

## 2020-10-13 DIAGNOSIS — N3281 Overactive bladder: Secondary | ICD-10-CM

## 2020-10-13 DIAGNOSIS — E1169 Type 2 diabetes mellitus with other specified complication: Secondary | ICD-10-CM

## 2020-10-13 DIAGNOSIS — I255 Ischemic cardiomyopathy: Secondary | ICD-10-CM

## 2020-10-13 DIAGNOSIS — I2510 Atherosclerotic heart disease of native coronary artery without angina pectoris: Secondary | ICD-10-CM | POA: Diagnosis not present

## 2020-10-13 DIAGNOSIS — E119 Type 2 diabetes mellitus without complications: Secondary | ICD-10-CM | POA: Insufficient documentation

## 2020-10-13 DIAGNOSIS — E663 Overweight: Secondary | ICD-10-CM | POA: Insufficient documentation

## 2020-10-13 DIAGNOSIS — I509 Heart failure, unspecified: Secondary | ICD-10-CM

## 2020-10-13 DIAGNOSIS — E6609 Other obesity due to excess calories: Secondary | ICD-10-CM

## 2020-10-13 LAB — POCT GLYCOSYLATED HEMOGLOBIN (HGB A1C): Hemoglobin A1C: 8.2 % — AB (ref 4.0–5.6)

## 2020-10-13 MED ORDER — METFORMIN HCL 1000 MG PO TABS: 500.0000 mg | ORAL_TABLET | Freq: Two times a day (BID) | ORAL | 0 refills | Status: DC

## 2020-10-13 MED ORDER — GLIMEPIRIDE 2 MG PO TABS: 2.0000 mg | ORAL_TABLET | Freq: Every day | ORAL | 0 refills | Status: DC

## 2020-10-13 NOTE — Patient Instructions (Signed)
https://www.diabeteseducator.org/docs/default-source/living-with-diabetes/conquering-the-grocery-store-v1.pdf?sfvrsn=4">  Carbohydrate Counting for Diabetes Mellitus, Adult Carbohydrate counting is a method of keeping track of how many carbohydrates you eat. Eating carbohydrates naturally increases the amount of sugar (glucose) in the blood. Counting how many carbohydrates you eat improves your bloodglucose control, which helps you manage your diabetes. It is important to know how many carbohydrates you can safely have in each meal. This is different for every person. A dietitian can help you make a meal plan and calculate how many carbohydrates you should have at each meal andsnack. What foods contain carbohydrates? Carbohydrates are found in the following foods: Grains, such as breads and cereals. Dried beans and soy products. Starchy vegetables, such as potatoes, peas, and corn. Fruit and fruit juices. Milk and yogurt. Sweets and snack foods, such as cake, cookies, candy, chips, and soft drinks. How do I count carbohydrates in foods? There are two ways to count carbohydrates in food. You can read food labels or learn standard serving sizes of foods. You can use either of the methods or acombination of both. Using the Nutrition Facts label The Nutrition Facts list is included on the labels of almost all packaged foods and beverages in the [place] It includes: The serving size. Information about nutrients in each serving, including the grams (g) of carbohydrate per serving. To use the Nutrition Facts: Decide how many servings you will have. Multiply the number of servings by the number of carbohydrates per serving. The resulting number is the total amount of carbohydrates that you will be having. Learning the standard serving sizes of foods When you eat carbohydrate foods that are not packaged or do not include Nutrition Facts on the label, you need to measure the servings in order to count the  amount of carbohydrates. Measure the foods that you will eat with a food scale or measuring cup, if needed. Decide how many standard-size servings you will eat. Multiply the number of servings by 15. For foods that contain carbohydrates, one serving equals 15 g of carbohydrates. For example, if you eat 2 cups or 10 oz (300 g) of strawberries, you will have eaten 2 servings and 30 g of carbohydrates (2 servings x 15 g = 30 g). For foods that have more than one food mixed, such as soups and casseroles, you must count the carbohydrates in each food that is included. The following list contains standard serving sizes of common carbohydrate-rich foods. Each of these servings has about 15 g of carbohydrates: 1 slice of bread. 1 six-inch (15 cm) tortilla. ? cup or 2 oz (53 g) cooked rice or pasta.  cup or 3 oz (85 g) cooked or canned, drained and rinsed beans or lentils.  cup or 3 oz (85 g) starchy vegetable, such as peas, corn, or squash.  cup or 4 oz (120 g) hot cereal.  cup or 3 oz (85 g) boiled or mashed potatoes, or  or 3 oz (85 g) of a large baked potato.  cup or 4 fl oz (118 mL) fruit juice. 1 cup or 8 fl oz (237 mL) milk. 1 small or 4 oz (106 g) apple.  or 2 oz (63 g) of a medium banana. 1 cup or 5 oz (150 g) strawberries. 3 cups or 1 oz (24 g) popped popcorn. What is an example of carbohydrate counting? To calculate the number of carbohydrates in this sample meal, follow the stepsshown below. Sample meal 3 oz (85 g) chicken breast. ? cup or 4 oz (106 g) brown rice.    cup or 3 oz (85 g) corn. 1 cup or 8 fl oz (237 mL) milk. 1 cup or 5 oz (150 g) strawberries with sugar-free whipped topping. Carbohydrate calculation Identify the foods that contain carbohydrates: Rice. Corn. Milk. Strawberries. Calculate how many servings you have of each food: 2 servings rice. 1 serving corn. 1 serving milk. 1 serving strawberries. Multiply each number of servings by 15 g: 2 servings  rice x 15 g = 30 g. 1 serving corn x 15 g = 15 g. 1 serving milk x 15 g = 15 g. 1 serving strawberries x 15 g = 15 g. Add together all of the amounts to find the total grams of carbohydrates eaten: 30 g + 15 g + 15 g + 15 g = 75 g of carbohydrates total. What are tips for following this plan? Shopping Develop a meal plan and then make a shopping list. Buy fresh and frozen vegetables, fresh and frozen fruit, dairy, eggs, beans, lentils, and whole grains. Look at food labels. Choose foods that have more fiber and less sugar. Avoid processed foods and foods with added sugars. Meal planning Aim to have the same amount of carbohydrates at each meal and for each snack time. Plan to have regular, balanced meals and snacks. Where to find more information American Diabetes Association: www.diabetes.org Centers for Disease Control and Prevention: www.cdc.gov Summary Carbohydrate counting is a method of keeping track of how many carbohydrates you eat. Eating carbohydrates naturally increases the amount of sugar (glucose) in the blood. Counting how many carbohydrates you eat improves your blood glucose control, which helps you manage your diabetes. A dietitian can help you make a meal plan and calculate how many carbohydrates you should have at each meal and snack. This information is not intended to replace advice given to you by your health care provider. Make sure you discuss any questions you have with your healthcare provider. Document Revised: 03/27/2019 Document Reviewed: 03/28/2019 Elsevier Patient Education  2021 Elsevier Inc.  

## 2020-10-13 NOTE — Assessment & Plan Note (Signed)
Lipid profile reviewed Continue Rosuvastatin, Vascepa, Carvedilol and Aspirin Encouraged her to consume a low-fat diet

## 2020-10-13 NOTE — Assessment & Plan Note (Signed)
Encouraged weight loss as this can help reduce reflux symptoms Continue Tums as needed

## 2020-10-13 NOTE — Assessment & Plan Note (Signed)
Managed on Ditropan 

## 2020-10-13 NOTE — Assessment & Plan Note (Signed)
Controlled on Carvedilol, Spironolactone and Entresto Reinforced DASH diet and exercise for weight loss Kidney function reviewed

## 2020-10-13 NOTE — Assessment & Plan Note (Signed)
Compensated Reinforced DASH diet Continue Entresto, Spironolactone and Carvedilol as prescribed

## 2020-10-13 NOTE — Assessment & Plan Note (Signed)
Kidney function reviewed 

## 2020-10-13 NOTE — Assessment & Plan Note (Signed)
Controlled on Carvedilol, Spironolactone and Entresto Reinforced DASH diet and exercise for weight loss Kidney function reviewed 

## 2020-10-13 NOTE — Assessment & Plan Note (Signed)
Lipid profile reviewed Continue Rosuvastatin, Vascepa, Carvedilol and Aspirin Encouraged her to consume a low-fat diet 

## 2020-10-13 NOTE — Assessment & Plan Note (Signed)
Lipid profile reviewed Continue Rosuvastatin, Vascepa and Aspirin Encouraged her to consume a low-fat diet

## 2020-10-13 NOTE — Assessment & Plan Note (Signed)
Encourage diet and exercise for weight loss 

## 2020-10-13 NOTE — Progress Notes (Signed)
Subjective:    Patient ID: Monique Franklin, female    DOB: October 08, 1955, 65 y.o.   MRN: [id number]  HPI  Patient presents the clinic today for follow-up of chronic conditions.  HTN with Cardiomyopathy: Her BP today is 112/50.  She is taking Carvedilol, Spironolactone and Entresto as prescribed.  ECG from 04/2020 reviewed.  HLD with CAD, PAD status post MI with Stents: Her last LDL was 40, triglycerides 976, 08/2020.  She denies myalgias on Rosuvastatin.  She is also taking the Vascepa, Carvedilol and Aspirin as prescribed.  She follows with cardiology.  CHF: She denies chronic cough, shortness of breath or edema.  She is taking Entresto, Spironolactone, and Carvedilol as prescribed.  Echo from 08/2020 reviewed.  She follows with cardiology.  GERD: Intermittent.  She takes Tums as needed with good relief of symptoms.  There is no upper GI on file.  DM2: Her last A1c was 8.8%, 05/2020.Marland Kitchen  She is taking Metformin, Glimepiride and Farxiga as prescribed.  She checks her feet routinely.  She does not check her sugars.  Her last eye exam was last week, [facility].  Flu 05/2020.  Pneumovax never.  Prevnar never.  COVID-Pfizer x3.  CKD 3: Her last creatinine was 1.15, GFR 53, 08/2020.  She is not currently on an ACEI or an ARB.  She does not follow with nephrology.  OAB: Mainly urinary frequency and urgency. She is taking Ditropan as prescribed. She does not follow with urology.  Review of Systems     Past Medical History:  Diagnosis Date   Cardiomyopathy (HCC)    CHF (congestive heart failure) (HCC)    Class I   Coronary artery disease    Diabetes mellitus without complication (HCC)    GERD (gastroesophageal reflux disease)    HFrEF (heart failure with reduced ejection fraction) (HCC)    Hyperlipidemia    Hypertension    MI (myocardial infarction) (HCC)    PAD (peripheral artery disease) (HCC)     Current Outpatient Medications  Medication Sig Dispense Refill   aspirin EC 81 MG tablet Take  81 mg by mouth daily.     carvedilol (COREG) 25 MG tablet Take 1 tablet (25 mg total) by mouth 2 (two) times daily. 180 tablet 3   clobetasol ointment (TEMOVATE) 0.05 % Apply to affected area every night for 4 weeks, then every other day for 4 weeks and then twice a week for 4 weeks or until resolution. 30 g 5   dapagliflozin propanediol (FARXIGA) 10 MG TABS tablet Take 1 tablet (10 mg total) by mouth daily. 90 tablet 3   furosemide (LASIX) 40 MG tablet Take 1 tablet (40 mg total) by mouth daily as needed for fluid or edema. 90 tablet 0   glimepiride (AMARYL) 2 MG tablet Take 2 mg by mouth daily with breakfast.     icosapent Ethyl (VASCEPA) 1 g capsule Take 2 capsules (2 g total) by mouth 2 (two) times daily. 360 capsule 3   metFORMIN (GLUCOPHAGE) 1000 MG tablet Take 500 mg by mouth 2 (two) times daily with a meal.     oxybutynin (DITROPAN-XL) 5 MG 24 hr tablet TAKE 1 TABLET(5 MG) BY MOUTH AT BEDTIME 90 tablet 3   rosuvastatin (CRESTOR) 40 MG tablet Take 1 tablet (40 mg total) by mouth daily at 6 PM. 90 tablet 3   sacubitril-valsartan (ENTRESTO) 97-103 MG Take 1 tablet by mouth 2 (two) times daily. 180 tablet 3   spironolactone (ALDACTONE) 25 MG tablet Take  1 tablet (25 mg total) by mouth daily. 90 tablet 3   No current facility-administered medications for this visit.    Allergies  Allergen Reactions   Ticagrelor Rash    Family History  Problem Relation Age of Onset   Heart attack Mother    COPD Father    Thyroid cancer Sister    Breast cancer Sister 107   Diabetes Sister    Diabetes Brother     Social History   Socioeconomic History   Marital status: Widowed    Spouse name: Not on file   Number of children: Not on file   Years of education: Not on file   Highest education level: Not on file  Occupational History   Not on file  Tobacco Use   Smoking status: Former    Packs/day: 1.00    Years: 33.00    Pack years: 33.00    Types: Cigarettes   Smokeless tobacco: Never   Vaping Use   Vaping Use: Never used  Substance and Sexual Activity   Alcohol use: Yes    Comment: occasional   Drug use: Never   Sexual activity: Not Currently    Birth control/protection: Post-menopausal  Other Topics Concern   Not on file  Social History Narrative   Not on file   Social Determinants of Health   Financial Resource Strain: Not on file  Food Insecurity: Not on file  Transportation Needs: Not on file  Physical Activity: Not on file  Stress: Not on file  Social Connections: Not on file  Intimate Partner Violence: Not on file     Constitutional: Denies fever, malaise, fatigue, headache or abrupt weight changes.  HEENT: Denies eye pain, eye redness, ear pain, ringing in the ears, wax buildup, runny nose, nasal congestion, bloody nose, or sore throat. Respiratory: Denies difficulty breathing, shortness of breath, cough or sputum production.   Cardiovascular: Denies chest pain, chest tightness, palpitations or swelling in the hands or feet.  Gastrointestinal: Denies abdominal pain, bloating, constipation, diarrhea or blood in the stool.  GU: Patient reports urinary urgency and frequency.  Denies pain with urination, burning sensation, blood in urine, odor or discharge. Musculoskeletal: Denies decrease in range of motion, difficulty with gait, muscle pain or joint pain and swelling.  Skin: Denies redness, rashes, lesions or ulcercations.  Neurological: Denies dizziness, difficulty with memory, difficulty with speech or problems with balance and coordination.  Psych: Denies anxiety, depression, SI/HI.  No other specific complaints in a complete review of systems (except as listed in HPI above).  Objective:   Physical Exam BP (!) 112/50 (BP Location: Right Arm, Patient Position: Sitting, Cuff Size: Large)   Pulse 64   Temp (!) 97.1 F (36.2 C) (Temporal)   Resp 17   Ht 5\' 3"  (1.6 m)   Wt 188 lb (85.3 kg)   SpO2 99%   BMI 33.30 kg/m   Wt Readings from Last 3  Encounters:  08/31/20 190 lb 9.6 oz (86.5 kg)  07/30/20 187 lb 6.4 oz (85 kg)  07/02/20 191 lb 3.2 oz (86.7 kg)    General: Appears her stated age, obese, in NAD. Skin: Warm, dry and intact. No ulcerations noted. HEENT: Head: normal shape and size; Eyes: sclera white and EOMs intact;  Cardiovascular: Normal rate and rhythm. S1,S2 noted.  No murmur, rubs or gallops noted. No JVD or BLE edema. No carotid bruits noted. Pulmonary/Chest: Normal effort and positive vesicular breath sounds. No respiratory distress. No wheezes, rales or ronchi noted.  Musculoskeletal: No difficulty with gait.  Neurological: Alert and oriented.  Psychiatric: Mood and affect normal. Behavior is normal. Judgment and thought content normal.     BMET    Component Value Date/Time   NA 136 08/31/2020 0922   NA 139 06/12/2019 0840   K 4.5 08/31/2020 0922   CL 106 08/31/2020 0922   CO2 22 08/31/2020 0922   GLUCOSE 262 (H) 08/31/2020 0922   BUN 24 (H) 08/31/2020 0922   BUN 28 (H) 06/12/2019 0840   CREATININE 1.15 (H) 08/31/2020 0922   CALCIUM 9.6 08/31/2020 0922   GFRNONAA 53 (L) 08/31/2020 0922   GFRAA >60 11/03/2019 1139    Lipid Panel     Component Value Date/Time   CHOL 137 08/31/2020 0922   TRIG 266 (H) 08/31/2020 0922   HDL 44 08/31/2020 0922   CHOLHDL 3.1 08/31/2020 0922   VLDL 53 (H) 08/31/2020 0922   LDLCALC 40 08/31/2020 0922    CBC    Component Value Date/Time   WBC 9.1 05/12/2020 1537   RBC 4.37 05/12/2020 1537   HGB 12.3 05/12/2020 1537   HCT 37.2 05/12/2020 1537   PLT 261.0 05/12/2020 1537   MCV 85.0 05/12/2020 1537   MCHC 33.1 05/12/2020 1537   RDW 15.4 05/12/2020 1537    Hgb A1C Lab Results  Component Value Date   HGBA1C 8.8 (H) 05/12/2020            Assessment & Plan:    Nicki Reaper, NP This visit occurred during the SARS-CoV-2 public health emergency.  Safety protocols were in place, including screening questions prior to the visit, additional usage of staff  PPE, and extensive cleaning of exam room while observing appropriate contact time as indicated for disinfecting solutions.

## 2020-10-13 NOTE — Assessment & Plan Note (Signed)
POCT A1c today Encouraged her to consume a low-carb diet and exercise weight loss Continue metformin, glimepiride and Farxiga Eye exam UTD Encourage negative flu shot in fall Prevnar 20 today Will get Pneumovax in 1 year Encouraged her to get her COVID booster

## 2020-10-13 NOTE — Assessment & Plan Note (Signed)
Lipid profile reviewed Continue Rosuvastatin, Vascepa and Aspirin Encouraged her to consume a low-fat diet 

## 2020-10-25 ENCOUNTER — Ambulatory Visit (INDEPENDENT_AMBULATORY_CARE_PROVIDER_SITE_OTHER): Payer: Medicare Other

## 2020-10-25 ENCOUNTER — Other Ambulatory Visit (HOSPITAL_COMMUNITY): Payer: Self-pay

## 2020-10-25 DIAGNOSIS — I429 Cardiomyopathy, unspecified: Secondary | ICD-10-CM

## 2020-10-25 MED ORDER — CARVEDILOL 25 MG PO TABS: 25.0000 mg | ORAL_TABLET | Freq: Two times a day (BID) | ORAL | 1 refills | Status: DC

## 2020-10-26 LAB — CUP PACEART REMOTE DEVICE CHECK
Battery Remaining Longevity: 78 mo
Battery Remaining Percentage: 76 %
Battery Voltage: 2.99 V
Brady Statistic RV Percent Paced: 1 %
Date Time Interrogation Session: 20220718020019
HighPow Impedance: 88 Ohm
HighPow Impedance: 88 Ohm
Implantable Lead Implant Date: 20200707
Implantable Lead Location: 753860
Implantable Pulse Generator Implant Date: 20200707
Lead Channel Impedance Value: 810 Ohm
Lead Channel Pacing Threshold Amplitude: 0.5 V
Lead Channel Pacing Threshold Pulse Width: 0.5 ms
Lead Channel Sensing Intrinsic Amplitude: 11.7 mV
Lead Channel Setting Pacing Amplitude: 2.5 V
Lead Channel Setting Pacing Pulse Width: 0.5 ms
Lead Channel Setting Sensing Sensitivity: 0.5 mV
Pulse Gen Serial Number: 9816687

## 2020-10-28 ENCOUNTER — Encounter: Payer: Self-pay | Admitting: Ophthalmology

## 2020-11-08 NOTE — Discharge Instructions (Signed)

## 2020-11-09 ENCOUNTER — Encounter
Admission: RE | Disposition: A | Discharge status: Home / Self Care | Payer: Self-pay | Source: Home / Self Care | Attending: Ophthalmology

## 2020-11-09 ENCOUNTER — Ambulatory Visit: Payer: Medicare Other | Admitting: Anesthesiology

## 2020-11-09 ENCOUNTER — Encounter: Payer: Self-pay | Admitting: Ophthalmology

## 2020-11-09 ENCOUNTER — Other Ambulatory Visit: Payer: Self-pay

## 2020-11-09 ENCOUNTER — Ambulatory Visit
Admission: RE | Admit: 2020-11-09 | Discharge: 2020-11-09 | Disposition: A | Discharge status: Home / Self Care | Payer: Medicare Other | Attending: Ophthalmology | Admitting: Ophthalmology

## 2020-11-09 DIAGNOSIS — E1151 Type 2 diabetes mellitus with diabetic peripheral angiopathy without gangrene: Secondary | ICD-10-CM | POA: Diagnosis not present

## 2020-11-09 DIAGNOSIS — Z955 Presence of coronary angioplasty implant and graft: Secondary | ICD-10-CM | POA: Insufficient documentation

## 2020-11-09 DIAGNOSIS — E785 Hyperlipidemia, unspecified: Secondary | ICD-10-CM | POA: Insufficient documentation

## 2020-11-09 DIAGNOSIS — Z881 Allergy status to other antibiotic agents status: Secondary | ICD-10-CM | POA: Diagnosis not present

## 2020-11-09 DIAGNOSIS — I252 Old myocardial infarction: Secondary | ICD-10-CM | POA: Insufficient documentation

## 2020-11-09 DIAGNOSIS — I2510 Atherosclerotic heart disease of native coronary artery without angina pectoris: Secondary | ICD-10-CM | POA: Insufficient documentation

## 2020-11-09 DIAGNOSIS — Z95810 Presence of automatic (implantable) cardiac defibrillator: Secondary | ICD-10-CM | POA: Insufficient documentation

## 2020-11-09 DIAGNOSIS — Z7982 Long term (current) use of aspirin: Secondary | ICD-10-CM | POA: Diagnosis not present

## 2020-11-09 DIAGNOSIS — I429 Cardiomyopathy, unspecified: Secondary | ICD-10-CM | POA: Diagnosis not present

## 2020-11-09 DIAGNOSIS — E1136 Type 2 diabetes mellitus with diabetic cataract: Secondary | ICD-10-CM | POA: Insufficient documentation

## 2020-11-09 DIAGNOSIS — I110 Hypertensive heart disease with heart failure: Secondary | ICD-10-CM | POA: Diagnosis not present

## 2020-11-09 DIAGNOSIS — H2511 Age-related nuclear cataract, right eye: Secondary | ICD-10-CM | POA: Insufficient documentation

## 2020-11-09 DIAGNOSIS — Z833 Family history of diabetes mellitus: Secondary | ICD-10-CM | POA: Insufficient documentation

## 2020-11-09 DIAGNOSIS — Z8249 Family history of ischemic heart disease and other diseases of the circulatory system: Secondary | ICD-10-CM | POA: Insufficient documentation

## 2020-11-09 DIAGNOSIS — I5022 Chronic systolic (congestive) heart failure: Secondary | ICD-10-CM | POA: Insufficient documentation

## 2020-11-09 DIAGNOSIS — Z7984 Long term (current) use of oral hypoglycemic drugs: Secondary | ICD-10-CM | POA: Diagnosis not present

## 2020-11-09 DIAGNOSIS — Z951 Presence of aortocoronary bypass graft: Secondary | ICD-10-CM | POA: Diagnosis not present

## 2020-11-09 DIAGNOSIS — Z87891 Personal history of nicotine dependence: Secondary | ICD-10-CM | POA: Diagnosis not present

## 2020-11-09 DIAGNOSIS — Z79899 Other long term (current) drug therapy: Secondary | ICD-10-CM | POA: Diagnosis not present

## 2020-11-09 HISTORY — PX: CATARACT EXTRACTION W/PHACO: SHX586

## 2020-11-09 LAB — GLUCOSE, CAPILLARY: Glucose-Capillary: 282 mg/dL — ABNORMAL HIGH (ref 70–99)

## 2020-11-09 SURGERY — PHACOEMULSIFICATION, CATARACT, WITH IOL INSERTION
Anesthesia: Monitor Anesthesia Care | Site: Eye | Laterality: Right

## 2020-11-09 MED ORDER — TETRACAINE HCL 0.5 % OP SOLN
1.0000 [drp] | OPHTHALMIC | Status: DC | PRN
  Administered 2020-11-09 (×3): 1 [drp] via OPHTHALMIC

## 2020-11-09 MED ORDER — SIGHTPATH DOSE#1 BSS IO SOLN
INTRAOCULAR | Status: DC | PRN
  Administered 2020-11-09: 54 mL via OPHTHALMIC

## 2020-11-09 MED ORDER — SIGHTPATH DOSE#1 BSS IO SOLN
INTRAOCULAR | Status: DC | PRN
  Administered 2020-11-09: 15 mL

## 2020-11-09 MED ORDER — MOXIFLOXACIN HCL 0.5 % OP SOLN
OPHTHALMIC | Status: DC | PRN
  Administered 2020-11-09: 0.2 mL via OPHTHALMIC

## 2020-11-09 MED ORDER — SIGHTPATH DOSE#1 BSS IO SOLN
INTRAOCULAR | Status: DC | PRN
  Administered 2020-11-09: 1 mL via INTRAMUSCULAR

## 2020-11-09 MED ORDER — BRIMONIDINE TARTRATE-TIMOLOL 0.2-0.5 % OP SOLN
OPHTHALMIC | Status: DC | PRN
  Administered 2020-11-09: 1 [drp] via OPHTHALMIC

## 2020-11-09 MED ORDER — MIDAZOLAM HCL 2 MG/2ML IJ SOLN
INTRAMUSCULAR | Status: DC | PRN
  Administered 2020-11-09: 1 mg via INTRAVENOUS

## 2020-11-09 MED ORDER — FENTANYL CITRATE (PF) 100 MCG/2ML IJ SOLN
INTRAMUSCULAR | Status: DC | PRN
  Administered 2020-11-09: 50 ug via INTRAVENOUS

## 2020-11-09 MED ORDER — SIGHTPATH DOSE#1 NA CHONDROIT SULF-NA HYALURON 40-17 MG/ML IO SOLN
INTRAOCULAR | Status: DC | PRN
  Administered 2020-11-09: 1 mL via INTRAOCULAR

## 2020-11-09 MED ORDER — CYCLOPENTOLATE HCL 2 % OP SOLN
1.0000 [drp] | OPHTHALMIC | Status: AC
  Administered 2020-11-09 (×3): 1 [drp] via OPHTHALMIC

## 2020-11-09 MED ORDER — PHENYLEPHRINE HCL 10 % OP SOLN
1.0000 [drp] | OPHTHALMIC | Status: AC
  Administered 2020-11-09 (×3): 1 [drp] via OPHTHALMIC

## 2020-11-09 SURGICAL SUPPLY — 20 items
CANNULA ANT/CHMB 27G (MISCELLANEOUS) ×2 IMPLANT
CANNULA ANT/CHMB 27GA (MISCELLANEOUS) ×4 IMPLANT
GLOVE SURG ENC TEXT LTX SZ8 (GLOVE) ×2 IMPLANT
GLOVE SURG TRIUMPH 8.0 PF LTX (GLOVE) ×2 IMPLANT
GOWN STRL REUS W/ TWL LRG LVL3 (GOWN DISPOSABLE) ×2 IMPLANT
GOWN STRL REUS W/TWL LRG LVL3 (GOWN DISPOSABLE) ×4
LENS IOL ACRSF IQ VT 15 21.0 IMPLANT
LENS IOL ACRYSOF VIVITY 21.0 ×2 IMPLANT
LENS IOL VIVITY 015 21.0 ×1 IMPLANT
MARKER SKIN DUAL TIP RULER LAB (MISCELLANEOUS) ×2 IMPLANT
NDL FILTER BLUNT 18X1 1/2 (NEEDLE) ×1 IMPLANT
NEEDLE FILTER BLUNT 18X 1/2SAF (NEEDLE) ×1
NEEDLE FILTER BLUNT 18X1 1/2 (NEEDLE) ×1 IMPLANT
PACK EYE AFTER SURG (MISCELLANEOUS) ×2 IMPLANT
SUT ETHILON 10-0 CS-B-6CS-B-6 (SUTURE)
SUTURE EHLN 10-0 CS-B-6CS-B-6 (SUTURE) IMPLANT
SYR 3ML LL SCALE MARK (SYRINGE) ×2 IMPLANT
SYR TB 1ML LUER SLIP (SYRINGE) ×2 IMPLANT
WATER STERILE IRR 250ML POUR (IV SOLUTION) ×2 IMPLANT
WIPE NON LINTING 3.25X3.25 (MISCELLANEOUS) ×2 IMPLANT

## 2020-11-09 NOTE — Anesthesia Preprocedure Evaluation (Signed)
Anesthesia Evaluation  Patient identified by MRN, date of birth, ID band Patient awake    History of Anesthesia Complications Negative for: history of anesthetic complications  Airway Mallampati: II  TM Distance: >3 FB Neck ROM: Full    Dental no notable dental hx.    Pulmonary former smoker,    Pulmonary exam normal        Cardiovascular Exercise Tolerance: Good hypertension, Pt. on medications and Pt. 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) and +CHF (EF 30%)  Normal cardiovascular exam+ Cardiac Defibrillator (AICD 2/2 CHF)      Neuro/Psych negative neurological ROS     GI/Hepatic   Endo/Other  diabetes, Type 2  Renal/GU      Musculoskeletal   Abdominal   Peds  Hematology negative hematology ROS (+)   Anesthesia Other Findings   Reproductive/Obstetrics                           Anesthesia Physical Anesthesia Plan  ASA: 3  Anesthesia Plan: MAC   Post-op Pain Management:    Induction: Intravenous  PONV Risk Score and Plan: 2 and TIVA, Midazolam and Treatment may vary due to age or medical condition  Airway Management Planned: Nasal Cannula and Natural Airway  Additional Equipment: None  Intra-op Plan:   Post-operative Plan:   Informed Consent: I have reviewed the patients History and Physical, chart, labs and discussed the procedure including the risks, benefits and alternatives for the proposed anesthesia with the patient or authorized representative who has indicated his/her understanding and acceptance.       Plan Discussed with: CRNA  Anesthesia Plan Comments:         Anesthesia Quick Evaluation

## 2020-11-09 NOTE — Transfer of Care (Signed)
Immediate Anesthesia Transfer of Care Note  Patient: Monique Franklin  Procedure(s) Performed: CATARACT EXTRACTION PHACO AND INTRAOCULAR LENS PLACEMENT (IOC) RIGHT VIVITY  DIABETIC (Right: Eye)  Patient Location: PACU  Anesthesia Type: MAC  Level of Consciousness: awake, alert  and patient cooperative  Airway and Oxygen Therapy: Patient Spontanous Breathing and Patient connected to supplemental oxygen  Post-op Assessment: Post-op Vital signs reviewed, Patient's Cardiovascular Status Stable, Respiratory Function Stable, Patent Airway and No signs of Nausea or vomiting  Post-op Vital Signs: Reviewed and stable  Complications: No notable events documented.

## 2020-11-09 NOTE — Op Note (Signed)
PREOPERATIVE DIAGNOSIS:  Nuclear sclerotic cataract of the right eye.   POSTOPERATIVE DIAGNOSIS:  H25.11 Cataract   OPERATIVE PROCEDURE:ORPROCALL@   SURGEON:  Galen Manila, MD.   ANESTHESIA:  Anesthesiologist: Page, Wille Celeste, MD CRNA: Michaele Offer, CRNA  1.      Managed anesthesia care. 2.      0.55ml of Shugarcaine was instilled in the eye following the paracentesis.   COMPLICATIONS:  None.   TECHNIQUE:   Stop and chop   DESCRIPTION OF PROCEDURE:  The patient was examined and consented in the preoperative holding area where the aforementioned topical anesthesia was applied to the right eye and then brought back to the Operating Room where the right eye was prepped and draped in the usual sterile ophthalmic fashion and a lid speculum was placed. A paracentesis was created with the side port blade and the anterior chamber was filled with viscoelastic. A near clear corneal incision was performed with the steel keratome. A continuous curvilinear capsulorrhexis was performed with a cystotome followed by the capsulorrhexis forceps. Hydrodissection and hydrodelineation were carried out with BSS on a blunt cannula. The lens was removed in a stop and chop  technique and the remaining cortical material was removed with the irrigation-aspiration handpiece. The capsular bag was inflated with viscoelastic and the Technis [id number]  lens was placed in the capsular bag without complication. The remaining viscoelastic was removed from the eye with the irrigation-aspiration handpiece. The wounds were hydrated. The anterior chamber was flushed with BSS and the eye was inflated to physiologic pressure. 0.90ml of Vigamox was placed in the anterior chamber. The wounds were found to be water tight. The eye was dressed with Combigan. The patient was given protective glasses to wear throughout the day and a shield with which to sleep tonight. The patient was also given drops with which to begin a drop regimen today and  will follow-up with me in one day. Implant Name Type Inv. Item Serial No. Manufacturer Lot No. LRB No. Used Action  LENS IOL ACRYSOF VIVITY 21.0 - [id number]  LENS IOL ACRYSOF VIVITY 21.0 [id number] [employer]  Right 1 Implanted   Procedure(s) with comments: CATARACT EXTRACTION PHACO AND INTRAOCULAR LENS PLACEMENT (IOC) RIGHT VIVITY  DIABETIC (Right) - 2.85 00:33.2  Electronically signed: Galen Manila 11/09/2020 10:29 AM

## 2020-11-09 NOTE — Anesthesia Postprocedure Evaluation (Signed)
Anesthesia Post Note  Patient: Monique Franklin  Procedure(s) Performed: CATARACT EXTRACTION PHACO AND INTRAOCULAR LENS PLACEMENT (IOC) RIGHT VIVITY  DIABETIC (Right: Eye)     Patient location during evaluation: PACU Anesthesia Type: MAC Level of consciousness: awake and alert Pain management: pain level controlled Vital Signs Assessment: post-procedure vital signs reviewed and stable Respiratory status: spontaneous breathing Cardiovascular status: blood pressure returned to baseline Postop Assessment: no apparent nausea or vomiting, adequate PO intake and no headache Anesthetic complications: no   No notable events documented.  Adele Barthel Layman Gully

## 2020-11-09 NOTE — H&P (Signed)
[facility]   Primary Care Physician:  Lorre Munroe, NP Ophthalmologist: Dr.Brad Lieurance  Pre-Procedure History & Physical: HPI:  Monique Franklin is a 65 y.o. female here for cataract surgery.   Past Medical History:  Diagnosis Date   Cardiomyopathy (HCC)    CHF (congestive heart failure) (HCC)    Class I   Coronary artery disease    Diabetes mellitus without complication (HCC)    GERD (gastroesophageal reflux disease)    HFrEF (heart failure with reduced ejection fraction) (HCC)    Hyperlipidemia    Hypertension    MI (myocardial infarction) (HCC)    PAD (peripheral artery disease) (HCC)     Past Surgical History:  Procedure Laterality Date   BREAST BIOPSY Bilateral 2016   neg   CARDIAC CATHETERIZATION     CARDIAC DEFIBRILLATOR PLACEMENT     COLONOSCOPY WITH PROPOFOL N/A 10/31/2019   Procedure: COLONOSCOPY WITH PROPOFOL;  Surgeon: Pasty Spillers, MD;  Location: [facility] ENDOSCOPY;  Service: Endoscopy;  Laterality: N/A;  priority 4   COMBINED AUGMENTATION MAMMAPLASTY AND ABDOMINOPLASTY     CORONARY ARTERY BYPASS GRAFT  08/2017   X2 with LIMA to LAD; 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;Severe 3 vessel CAD-PCI to LAD and LC x(5stents) Coronary dissection of LM to LAD    CORONARY ARTERY BYPASS GRAFT     CORONARY STENT PLACEMENT     PERCUTANEOUS CORONARY STENT INTERVENTION (PCI-S)     REDUCTION MAMMAPLASTY Bilateral 2017   TONSILLECTOMY      Prior to Admission medications   Medication Sig Start Date End Date Taking? Authorizing Provider  aspirin EC 81 MG tablet Take 81 mg by mouth daily.   Yes [provider]  carvedilol (COREG) 25 MG tablet Take 1 tablet (25 mg total) by mouth 2 (two) times daily. 10/25/20  Yes Laurey Morale, MD  dapagliflozin propanediol (FARXIGA) 10 MG TABS tablet Take 1 tablet (10 mg total) by mouth daily. 08/05/20  Yes Laurey Morale, MD  glimepiride (AMARYL) 2 MG tablet Take 1 tablet (2 mg total) by mouth daily with breakfast. 10/13/20  Yes  Baity, Salvadore Oxford, NP  icosapent Ethyl (VASCEPA) 1 g capsule Take 2 capsules (2 g total) by mouth 2 (two) times daily. 06/28/20  Yes Laurey Morale, MD  metFORMIN (GLUCOPHAGE) 1000 MG tablet Take 0.5 tablets (500 mg total) by mouth 2 (two) times daily with a meal. 10/13/20  Yes Baity, Salvadore Oxford, NP  Multiple Vitamin (MULTIVITAMIN) tablet Take 1 tablet by mouth daily.   Yes [provider]  oxybutynin (DITROPAN-XL) 5 MG 24 hr tablet TAKE 1 TABLET(5 MG) BY MOUTH AT BEDTIME 07/03/20  Yes Schuman, Christanna R, MD  rosuvastatin (CRESTOR) 40 MG tablet Take 1 tablet (40 mg total) by mouth daily at 6 PM. 08/05/20  Yes Laurey Morale, MD  sacubitril-valsartan (ENTRESTO) 97-103 MG Take 1 tablet by mouth 2 (two) times daily. 08/05/20  Yes Laurey Morale, MD  spironolactone (ALDACTONE) 25 MG tablet Take 1 tablet (25 mg total) by mouth daily. 05/26/20  Yes Laurey Morale, MD  clobetasol ointment (TEMOVATE) 0.05 % Apply to affected area every night for 4 weeks, then every other day for 4 weeks and then twice a week for 4 weeks or until resolution. 07/02/20   Natale Milch, MD    Allergies as of 10/06/2020 - Review Complete 08/31/2020  Allergen Reaction Noted   Ticagrelor Rash 01/02/2019    Family History  Problem Relation Age of Onset   Heart  attack Mother    COPD Father    Thyroid cancer Sister    Breast cancer Sister 31   Diabetes Sister    Diabetes Brother     Social History   Socioeconomic History   Marital status: Widowed    Spouse name: Not on file   Number of children: Not on file   Years of education: Not on file   Highest education level: Not on file  Occupational History   Not on file  Tobacco Use   Smoking status: Former    Packs/day: 1.00    Years: 33.00    Pack years: 33.00    Types: Cigarettes   Smokeless tobacco: Never  Vaping Use   Vaping Use: Never used  Substance and Sexual Activity   Alcohol use: Yes    Comment: occasional   Drug use: Never    Sexual activity: Not Currently    Birth control/protection: Post-menopausal  Other Topics Concern   Not on file  Social History Narrative   Not on file   Social Determinants of Health   Financial Resource Strain: Not on file  Food Insecurity: Not on file  Transportation Needs: Not on file  Physical Activity: Not on file  Stress: Not on file  Social Connections: Not on file  Intimate Partner Violence: Not on file    Review of Systems: See HPI, otherwise negative ROS  Physical Exam: BP 118/63   Pulse 66   Temp (!) 97.2 F (36.2 C) (Temporal)   Ht 5\' 3"  (1.6 m)   Wt 85.7 kg   SpO2 99%   BMI 33.48 kg/m  General:   Alert, cooperative in NAD Head:  Normocephalic and atraumatic. Respiratory:  Normal work of breathing. Cardiovascular:  RRR  Impression/Plan: Monique Franklin is here for cataract surgery.  Risks, benefits, limitations, and alternatives regarding cataract surgery have been reviewed with the patient.  Questions have been answered.  All parties agreeable.   [name], MD  11/09/2020, 9:35 AM

## 2020-11-09 NOTE — H&P (Signed)
[facility]   Primary Care Physician:  Lorre Munroe, NP Ophthalmologist: Dr. Druscilla Brownie  Pre-Procedure History & Physical: HPI:  Monique Franklin is a 65 y.o. female here for cataract surgery.   Past Medical History:  Diagnosis Date   Cardiomyopathy (HCC)    CHF (congestive heart failure) (HCC)    Class I   Coronary artery disease    Diabetes mellitus without complication (HCC)    GERD (gastroesophageal reflux disease)    HFrEF (heart failure with reduced ejection fraction) (HCC)    Hyperlipidemia    Hypertension    MI (myocardial infarction) (HCC)    PAD (peripheral artery disease) (HCC)     Past Surgical History:  Procedure Laterality Date   BREAST BIOPSY Bilateral 2016   neg   CARDIAC CATHETERIZATION     CARDIAC DEFIBRILLATOR PLACEMENT     COLONOSCOPY WITH PROPOFOL N/A 10/31/2019   Procedure: COLONOSCOPY WITH PROPOFOL;  Surgeon: Pasty Spillers, MD;  Location: [facility] ENDOSCOPY;  Service: Endoscopy;  Laterality: N/A;  priority 4   COMBINED AUGMENTATION MAMMAPLASTY AND ABDOMINOPLASTY     CORONARY ARTERY BYPASS GRAFT  08/2017   X2 with LIMA to LAD; 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;Severe 3 vessel CAD-PCI to LAD and LC x(5stents) Coronary dissection of LM to LAD    CORONARY ARTERY BYPASS GRAFT     CORONARY STENT PLACEMENT     PERCUTANEOUS CORONARY STENT INTERVENTION (PCI-S)     REDUCTION MAMMAPLASTY Bilateral 2017   TONSILLECTOMY      Prior to Admission medications   Medication Sig Start Date End Date Taking? Authorizing Provider  aspirin EC 81 MG tablet Take 81 mg by mouth daily.   Yes [provider]  carvedilol (COREG) 25 MG tablet Take 1 tablet (25 mg total) by mouth 2 (two) times daily. 10/25/20  Yes Laurey Morale, MD  dapagliflozin propanediol (FARXIGA) 10 MG TABS tablet Take 1 tablet (10 mg total) by mouth daily. 08/05/20  Yes Laurey Morale, MD  glimepiride (AMARYL) 2 MG tablet Take 1 tablet (2 mg total) by mouth daily with breakfast. 10/13/20  Yes  Baity, Salvadore Oxford, NP  icosapent Ethyl (VASCEPA) 1 g capsule Take 2 capsules (2 g total) by mouth 2 (two) times daily. 06/28/20  Yes Laurey Morale, MD  metFORMIN (GLUCOPHAGE) 1000 MG tablet Take 0.5 tablets (500 mg total) by mouth 2 (two) times daily with a meal. 10/13/20  Yes Baity, Salvadore Oxford, NP  Multiple Vitamin (MULTIVITAMIN) tablet Take 1 tablet by mouth daily.   Yes [provider]  oxybutynin (DITROPAN-XL) 5 MG 24 hr tablet TAKE 1 TABLET(5 MG) BY MOUTH AT BEDTIME 07/03/20  Yes Schuman, Christanna R, MD  rosuvastatin (CRESTOR) 40 MG tablet Take 1 tablet (40 mg total) by mouth daily at 6 PM. 08/05/20  Yes Laurey Morale, MD  sacubitril-valsartan (ENTRESTO) 97-103 MG Take 1 tablet by mouth 2 (two) times daily. 08/05/20  Yes Laurey Morale, MD  spironolactone (ALDACTONE) 25 MG tablet Take 1 tablet (25 mg total) by mouth daily. 05/26/20  Yes Laurey Morale, MD  clobetasol ointment (TEMOVATE) 0.05 % Apply to affected area every night for 4 weeks, then every other day for 4 weeks and then twice a week for 4 weeks or until resolution. 07/02/20   Natale Milch, MD    Allergies as of 10/06/2020 - Review Complete 08/31/2020  Allergen Reaction Noted   Ticagrelor Rash 01/02/2019    Family History  Problem Relation Age of Onset  Heart attack Mother    COPD Father    Thyroid cancer Sister    Breast cancer Sister 53   Diabetes Sister    Diabetes Brother     Social History   Socioeconomic History   Marital status: Widowed    Spouse name: Not on file   Number of children: Not on file   Years of education: Not on file   Highest education level: Not on file  Occupational History   Not on file  Tobacco Use   Smoking status: Former    Packs/day: 1.00    Years: 33.00    Pack years: 33.00    Types: Cigarettes   Smokeless tobacco: Never  Vaping Use   Vaping Use: Never used  Substance and Sexual Activity   Alcohol use: Yes    Comment: occasional   Drug use: Never    Sexual activity: Not Currently    Birth control/protection: Post-menopausal  Other Topics Concern   Not on file  Social History Narrative   Not on file   Social Determinants of Health   Financial Resource Strain: Not on file  Food Insecurity: Not on file  Transportation Needs: Not on file  Physical Activity: Not on file  Stress: Not on file  Social Connections: Not on file  Intimate Partner Violence: Not on file    Review of Systems: See HPI, otherwise negative ROS  Physical Exam: BP 118/63   Pulse 66   Temp (!) 97.2 F (36.2 C) (Temporal)   Ht 5\' 3"  (1.6 m)   Wt 85.7 kg   SpO2 99%   BMI 33.48 kg/m  General:   Alert, cooperative in NAD Head:  Normocephalic and atraumatic. Respiratory:  Normal work of breathing. Cardiovascular:  RRR  Impression/Plan: Monique Franklin is here for cataract surgery.  Risks, benefits, limitations, and alternatives regarding cataract surgery have been reviewed with the patient.  Questions have been answered.  All parties agreeable.   [name], MD  11/09/2020, 10:01 AM

## 2020-11-10 ENCOUNTER — Other Ambulatory Visit: Payer: Self-pay

## 2020-11-10 ENCOUNTER — Encounter: Payer: Self-pay | Admitting: Ophthalmology

## 2020-11-10 LAB — GLUCOSE, CAPILLARY: Glucose-Capillary: 259 mg/dL — ABNORMAL HIGH (ref 70–99)

## 2020-11-15 ENCOUNTER — Ambulatory Visit: Payer: Medicare Other | Admitting: Internal Medicine

## 2020-11-15 ENCOUNTER — Other Ambulatory Visit: Payer: Medicare Other

## 2020-11-16 NOTE — Progress Notes (Signed)
Remote ICD transmission.   

## 2020-11-25 NOTE — Discharge Instructions (Signed)

## 2020-11-30 ENCOUNTER — Ambulatory Visit: Payer: Medicare Other | Admitting: Anesthesiology

## 2020-11-30 ENCOUNTER — Other Ambulatory Visit: Payer: Self-pay

## 2020-11-30 ENCOUNTER — Ambulatory Visit
Admission: RE | Admit: 2020-11-30 | Discharge: 2020-11-30 | Disposition: A | Discharge status: Home / Self Care | Payer: Medicare Other | Attending: Ophthalmology | Admitting: Ophthalmology

## 2020-11-30 ENCOUNTER — Encounter
Admission: RE | Disposition: A | Discharge status: Home / Self Care | Payer: Self-pay | Source: Home / Self Care | Attending: Ophthalmology

## 2020-11-30 DIAGNOSIS — E1151 Type 2 diabetes mellitus with diabetic peripheral angiopathy without gangrene: Secondary | ICD-10-CM | POA: Insufficient documentation

## 2020-11-30 DIAGNOSIS — Z87891 Personal history of nicotine dependence: Secondary | ICD-10-CM | POA: Diagnosis not present

## 2020-11-30 DIAGNOSIS — Z955 Presence of coronary angioplasty implant and graft: Secondary | ICD-10-CM | POA: Insufficient documentation

## 2020-11-30 DIAGNOSIS — Z888 Allergy status to other drugs, medicaments and biological substances status: Secondary | ICD-10-CM | POA: Insufficient documentation

## 2020-11-30 DIAGNOSIS — Z951 Presence of aortocoronary bypass graft: Secondary | ICD-10-CM | POA: Insufficient documentation

## 2020-11-30 DIAGNOSIS — Z9841 Cataract extraction status, right eye: Secondary | ICD-10-CM | POA: Diagnosis not present

## 2020-11-30 DIAGNOSIS — H2512 Age-related nuclear cataract, left eye: Secondary | ICD-10-CM | POA: Diagnosis not present

## 2020-11-30 DIAGNOSIS — E1136 Type 2 diabetes mellitus with diabetic cataract: Secondary | ICD-10-CM | POA: Insufficient documentation

## 2020-11-30 DIAGNOSIS — Z95810 Presence of automatic (implantable) cardiac defibrillator: Secondary | ICD-10-CM | POA: Insufficient documentation

## 2020-11-30 DIAGNOSIS — Z7982 Long term (current) use of aspirin: Secondary | ICD-10-CM | POA: Insufficient documentation

## 2020-11-30 DIAGNOSIS — Z79899 Other long term (current) drug therapy: Secondary | ICD-10-CM | POA: Insufficient documentation

## 2020-11-30 DIAGNOSIS — Z7984 Long term (current) use of oral hypoglycemic drugs: Secondary | ICD-10-CM | POA: Insufficient documentation

## 2020-11-30 HISTORY — PX: CATARACT EXTRACTION W/PHACO: SHX586

## 2020-11-30 LAB — GLUCOSE, CAPILLARY
Glucose-Capillary: 281 mg/dL — ABNORMAL HIGH (ref 70–99)
Glucose-Capillary: 314 mg/dL — ABNORMAL HIGH (ref 70–99)

## 2020-11-30 SURGERY — PHACOEMULSIFICATION, CATARACT, WITH IOL INSERTION
Anesthesia: Monitor Anesthesia Care | Site: Eye | Laterality: Left

## 2020-11-30 MED ORDER — MIDAZOLAM HCL 2 MG/2ML IJ SOLN
INTRAMUSCULAR | Status: DC | PRN
  Administered 2020-11-30: 1 mg via INTRAVENOUS

## 2020-11-30 MED ORDER — LACTATED RINGERS IV SOLN: INTRAVENOUS | Status: DC

## 2020-11-30 MED ORDER — MOXIFLOXACIN HCL 0.5 % OP SOLN
OPHTHALMIC | Status: DC | PRN
  Administered 2020-11-30: 0.2 mL via OPHTHALMIC

## 2020-11-30 MED ORDER — BRIMONIDINE TARTRATE-TIMOLOL 0.2-0.5 % OP SOLN
OPHTHALMIC | Status: DC | PRN
  Administered 2020-11-30: 1 [drp] via OPHTHALMIC

## 2020-11-30 MED ORDER — SIGHTPATH DOSE#1 NA CHONDROIT SULF-NA HYALURON 40-17 MG/ML IO SOLN
INTRAOCULAR | Status: DC | PRN
  Administered 2020-11-30: 1 mL via INTRAOCULAR

## 2020-11-30 MED ORDER — CYCLOPENTOLATE HCL 2 % OP SOLN
1.0000 [drp] | OPHTHALMIC | Status: DC | PRN
  Administered 2020-11-30 (×3): 1 [drp] via OPHTHALMIC

## 2020-11-30 MED ORDER — SIGHTPATH DOSE#1 BSS IO SOLN
INTRAOCULAR | Status: DC | PRN
  Administered 2020-11-30: 15 mL via INTRAOCULAR

## 2020-11-30 MED ORDER — FENTANYL CITRATE (PF) 100 MCG/2ML IJ SOLN
INTRAMUSCULAR | Status: DC | PRN
  Administered 2020-11-30: 50 ug via INTRAVENOUS

## 2020-11-30 MED ORDER — SIGHTPATH DOSE#1 BSS IO SOLN
INTRAOCULAR | Status: DC | PRN
  Administered 2020-11-30: 2 mL

## 2020-11-30 MED ORDER — PHENYLEPHRINE HCL 10 % OP SOLN
1.0000 [drp] | OPHTHALMIC | Status: DC | PRN
  Administered 2020-11-30 (×3): 1 [drp] via OPHTHALMIC

## 2020-11-30 MED ORDER — ACETAMINOPHEN 325 MG PO TABS: 325.0000 mg | ORAL_TABLET | ORAL | Status: DC | PRN

## 2020-11-30 MED ORDER — TETRACAINE HCL 0.5 % OP SOLN
1.0000 [drp] | OPHTHALMIC | Status: DC | PRN
  Administered 2020-11-30 (×3): 1 [drp] via OPHTHALMIC

## 2020-11-30 MED ORDER — SIGHTPATH DOSE#1 BSS IO SOLN
INTRAOCULAR | Status: DC | PRN
  Administered 2020-11-30: 39 mL via OPHTHALMIC

## 2020-11-30 MED ORDER — ACETAMINOPHEN 160 MG/5ML PO SOLN: 325.0000 mg | ORAL | Status: DC | PRN

## 2020-11-30 SURGICAL SUPPLY — 16 items
CANNULA ANT/CHMB 27GA (MISCELLANEOUS) ×4 IMPLANT
GLOVE SURG ENC TEXT LTX SZ8 (GLOVE) ×2 IMPLANT
GLOVE SURG TRIUMPH 8.0 PF LTX (GLOVE) ×2 IMPLANT
GOWN STRL REUS W/ TWL LRG LVL3 (GOWN DISPOSABLE) ×2 IMPLANT
GOWN STRL REUS W/TWL LRG LVL3 (GOWN DISPOSABLE) ×4
LENS IOL ACRSF IQ VT 15 21.5 ×1 IMPLANT
LENS IOL ACRYSOF VIVITY 21.5 ×2 IMPLANT
LENS IOL VIVITY 015 21.5 ×1 IMPLANT
MARKER SKIN DUAL TIP RULER LAB (MISCELLANEOUS) ×2 IMPLANT
NEEDLE FILTER BLUNT 18X 1/2SAF (NEEDLE) ×1
NEEDLE FILTER BLUNT 18X1 1/2 (NEEDLE) ×1 IMPLANT
PACK EYE AFTER SURG (MISCELLANEOUS) ×2 IMPLANT
SYR 3ML LL SCALE MARK (SYRINGE) ×2 IMPLANT
SYR TB 1ML LUER SLIP (SYRINGE) ×2 IMPLANT
WATER STERILE IRR 250ML POUR (IV SOLUTION) ×2 IMPLANT
WIPE NON LINTING 3.25X3.25 (MISCELLANEOUS) ×2 IMPLANT

## 2020-11-30 NOTE — H&P (Signed)
[facility]   Primary Care Physician:  Lorre Munroe, NP Ophthalmologist: Dr.Aquarius Tremper  Pre-Procedure History & Physical: HPI:  Monique Franklin is a 65 y.o. female here for cataract surgery.   Past Medical History:  Diagnosis Date   Cardiomyopathy (HCC)    CHF (congestive heart failure) (HCC)    Class I   Coronary artery disease    Diabetes mellitus without complication (HCC)    GERD (gastroesophageal reflux disease)    HFrEF (heart failure with reduced ejection fraction) (HCC)    Hyperlipidemia    Hypertension    MI (myocardial infarction) (HCC)    PAD (peripheral artery disease) (HCC)     Past Surgical History:  Procedure Laterality Date   BREAST BIOPSY Bilateral 2016   neg   CARDIAC CATHETERIZATION     CARDIAC DEFIBRILLATOR PLACEMENT     CATARACT EXTRACTION W/PHACO Right 11/09/2020   Procedure: CATARACT EXTRACTION PHACO AND INTRAOCULAR LENS PLACEMENT (IOC) RIGHT VIVITY  DIABETIC;  Surgeon: Galen Manila, MD;  Location: [facility];  Service: Ophthalmology;  Laterality: Right;  2.85 00:33.2   COLONOSCOPY WITH PROPOFOL N/A 10/31/2019   Procedure: COLONOSCOPY WITH PROPOFOL;  Surgeon: Pasty Spillers, MD;  Location: [facility] ENDOSCOPY;  Service: Endoscopy;  Laterality: N/A;  priority 4   COMBINED AUGMENTATION MAMMAPLASTY AND ABDOMINOPLASTY     CORONARY ARTERY BYPASS GRAFT  08/2017   X2 with LIMA to LAD; 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;Severe 3 vessel CAD-PCI to LAD and LC x(5stents) Coronary dissection of LM to LAD    CORONARY ARTERY BYPASS GRAFT     CORONARY STENT PLACEMENT     PERCUTANEOUS CORONARY STENT INTERVENTION (PCI-S)     REDUCTION MAMMAPLASTY Bilateral 2017   TONSILLECTOMY      Prior to Admission medications   Medication Sig Start Date End Date Taking? Authorizing Provider  aspirin EC 81 MG tablet Take 81 mg by mouth daily.   Yes [provider]  carvedilol (COREG) 25 MG tablet Take 1 tablet (25 mg total) by mouth 2 (two) times daily.  10/25/20  Yes Laurey Morale, MD  dapagliflozin propanediol (FARXIGA) 10 MG TABS tablet Take 1 tablet (10 mg total) by mouth daily. 08/05/20  Yes Laurey Morale, MD  glimepiride (AMARYL) 2 MG tablet Take 1 tablet (2 mg total) by mouth daily with breakfast. 10/13/20  Yes Baity, Salvadore Oxford, NP  icosapent Ethyl (VASCEPA) 1 g capsule Take 2 capsules (2 g total) by mouth 2 (two) times daily. 06/28/20  Yes Laurey Morale, MD  metFORMIN (GLUCOPHAGE) 1000 MG tablet Take 0.5 tablets (500 mg total) by mouth 2 (two) times daily with a meal. 10/13/20  Yes Baity, Salvadore Oxford, NP  Multiple Vitamin (MULTIVITAMIN) tablet Take 1 tablet by mouth daily.   Yes [provider]  oxybutynin (DITROPAN-XL) 5 MG 24 hr tablet TAKE 1 TABLET(5 MG) BY MOUTH AT BEDTIME 07/03/20  Yes Schuman, Christanna R, MD  rosuvastatin (CRESTOR) 40 MG tablet Take 1 tablet (40 mg total) by mouth daily at 6 PM. 08/05/20  Yes Laurey Morale, MD  sacubitril-valsartan (ENTRESTO) 97-103 MG Take 1 tablet by mouth 2 (two) times daily. 08/05/20  Yes Laurey Morale, MD  spironolactone (ALDACTONE) 25 MG tablet Take 1 tablet (25 mg total) by mouth daily. 05/26/20  Yes Laurey Morale, MD  clobetasol ointment (TEMOVATE) 0.05 % Apply to affected area every night for 4 weeks, then every other day for 4 weeks and then twice a week for 4 weeks or until resolution.  07/02/20   Natale Milch, MD    Allergies as of 10/06/2020 - Review Complete 08/31/2020  Allergen Reaction Noted   Ticagrelor Rash 01/02/2019    Family History  Problem Relation Age of Onset   Heart attack Mother    COPD Father    Thyroid cancer Sister    Breast cancer Sister 41   Diabetes Sister    Diabetes Brother     Social History   Socioeconomic History   Marital status: Widowed    Spouse name: Not on file   Number of children: Not on file   Years of education: Not on file   Highest education level: Not on file  Occupational History   Not on file  Tobacco Use    Smoking status: Former    Packs/day: 1.00    Years: 33.00    Pack years: 33.00    Types: Cigarettes   Smokeless tobacco: Never  Vaping Use   Vaping Use: Never used  Substance and Sexual Activity   Alcohol use: Yes    Comment: occasional   Drug use: Never   Sexual activity: Not Currently    Birth control/protection: Post-menopausal  Other Topics Concern   Not on file  Social History Narrative   Not on file   Social Determinants of Health   Financial Resource Strain: Not on file  Food Insecurity: Not on file  Transportation Needs: Not on file  Physical Activity: Not on file  Stress: Not on file  Social Connections: Not on file  Intimate Partner Violence: Not on file    Review of Systems: See HPI, otherwise negative ROS  Physical Exam: BP 95/62   Pulse 70   Temp (!) 97.5 F (36.4 C) (Temporal)   Resp 18   Ht 5\' 3"  (1.6 m)   Wt 84.8 kg   SpO2 97%   BMI 33.13 kg/m  General:   Alert, cooperative in NAD Head:  Normocephalic and atraumatic. Respiratory:  Normal work of breathing. Cardiovascular:  RRR  Impression/Plan: Monique Franklin is here for cataract surgery.  Risks, benefits, limitations, and alternatives regarding cataract surgery have been reviewed with the patient.  Questions have been answered.  All parties agreeable.   [name], MD  11/30/2020, 8:45 AM

## 2020-11-30 NOTE — Transfer of Care (Signed)
Immediate Anesthesia Transfer of Care Note  Patient: Monique Franklin  Procedure(s) Performed: CATARACT EXTRACTION PHACO AND INTRAOCULAR LENS PLACEMENT (IOC) LEFT DIABETIC VIVITY 3.86 00:28.3 (Left: Eye)  Patient Location: PACU  Anesthesia Type: MAC  Level of Consciousness: awake, alert  and patient cooperative  Airway and Oxygen Therapy: Patient Spontanous Breathing and Patient connected to supplemental oxygen  Post-op Assessment: Post-op Vital signs reviewed, Patient's Cardiovascular Status Stable, Respiratory Function Stable, Patent Airway and No signs of Nausea or vomiting  Post-op Vital Signs: Reviewed and stable  Complications: No notable events documented.

## 2020-11-30 NOTE — Op Note (Signed)
PREOPERATIVE DIAGNOSIS:  Nuclear sclerotic cataract of the left eye.   POSTOPERATIVE DIAGNOSIS:  Nuclear sclerotic cataract of the left eye.   OPERATIVE PROCEDURE:ORPROCALL@   SURGEON:  Galen Manila, MD.   ANESTHESIA:  Anesthesiologist: Baxter Flattery, MD CRNA: Maree Krabbe, CRNA  1.      Managed anesthesia care. 2.     0.22ml of Shugarcaine was instilled following the paracentesis   COMPLICATIONS:  None.   TECHNIQUE:   Stop and chop   DESCRIPTION OF PROCEDURE:  The patient was examined and consented in the preoperative holding area where the aforementioned topical anesthesia was applied to the left eye and then brought back to the Operating Room where the left eye was prepped and draped in the usual sterile ophthalmic fashion and a lid speculum was placed. A paracentesis was created with the side port blade and the anterior chamber was filled with viscoelastic. A near clear corneal incision was performed with the steel keratome. A continuous curvilinear capsulorrhexis was performed with a cystotome followed by the capsulorrhexis forceps. Hydrodissection and hydrodelineation were carried out with BSS on a blunt cannula. The lens was removed in a stop and chop  technique and the remaining cortical material was removed with the irrigation-aspiration handpiece. The capsular bag was inflated with viscoelastic and the Technis [id number] lens was placed in the capsular bag without complication. The remaining viscoelastic was removed from the eye with the irrigation-aspiration handpiece. The wounds were hydrated. The anterior chamber was flushed with BSS and the eye was inflated to physiologic pressure. 0.63ml Vigamox was placed in the anterior chamber. The wounds were found to be water tight. The eye was dressed with Combigan. The patient was given protective glasses to wear throughout the day and a shield with which to sleep tonight. The patient was also given drops with which to begin a drop regimen today  and will follow-up with me in one day. Implant Name Type Inv. Item Serial No. Manufacturer Lot No. LRB No. Used Action  LENS IOL ACRYSOF VIVITY 21.5 - [id number]  LENS IOL ACRYSOF VIVITY 21.5 [id number] [employer]CON  Left 1 Implanted    Procedure(s): CATARACT EXTRACTION PHACO AND INTRAOCULAR LENS PLACEMENT (IOC) LEFT DIABETIC VIVITY 3.86 00:28.3 (Left)  Electronically signed: Galen Manila 11/30/2020 9:07 AM

## 2020-11-30 NOTE — Anesthesia Procedure Notes (Signed)
Procedure Name: MAC Date/Time: 11/30/2020 8:51 AM Performed by: Cameron Ali, CRNA Pre-anesthesia Checklist: Patient identified, Emergency Drugs available, Suction available, Timeout performed and Patient being monitored Patient Re-evaluated:Patient Re-evaluated prior to induction Oxygen Delivery Method: Nasal cannula Placement Confirmation: positive ETCO2

## 2020-11-30 NOTE — Anesthesia Postprocedure Evaluation (Signed)
Anesthesia Post Note  Patient: Ammanda Dobbins  Procedure(s) Performed: CATARACT EXTRACTION PHACO AND INTRAOCULAR LENS PLACEMENT (IOC) LEFT DIABETIC VIVITY 3.86 00:28.3 (Left: Eye)     Patient location during evaluation: PACU Anesthesia Type: MAC Level of consciousness: awake and alert Pain management: pain level controlled Vital Signs Assessment: post-procedure vital signs reviewed and stable Respiratory status: spontaneous breathing, nonlabored ventilation, respiratory function stable and patient connected to nasal cannula oxygen Cardiovascular status: stable and blood pressure returned to baseline Postop Assessment: no apparent nausea or vomiting Anesthetic complications: no   No notable events documented.  Trecia Rogers

## 2020-11-30 NOTE — Anesthesia Preprocedure Evaluation (Signed)
Anesthesia Evaluation  Patient identified by MRN, date of birth, ID band Patient awake    Reviewed: Allergy & Precautions, H&P , NPO status , Patient's Chart, lab work & pertinent test results, reviewed documented beta blocker date and time   Airway Mallampati: II  TM Distance: >3 FB Neck ROM: full    Dental no notable dental hx.    Pulmonary former smoker,    Pulmonary exam normal breath sounds clear to auscultation       Cardiovascular Exercise Tolerance: Good hypertension, + CAD, + Past MI, + Peripheral Vascular Disease and +CHF  Normal cardiovascular exam Rhythm:regular Rate:Normal     Neuro/Psych negative neurological ROS  negative psych ROS   GI/Hepatic Neg liver ROS, GERD  Controlled,  Endo/Other  diabetes, Type 2, Oral Hypoglycemic Agents  Renal/GU CRFRenal disease  negative genitourinary   Musculoskeletal   Abdominal   Peds  Hematology negative hematology ROS (+)   Anesthesia Other Findings   Reproductive/Obstetrics negative OB ROS                             Anesthesia Physical Anesthesia Plan  ASA: 3  Anesthesia Plan: MAC   Post-op Pain Management:    Induction:   PONV Risk Score and Plan:   Airway Management Planned:   Additional Equipment:   Intra-op Plan:   Post-operative Plan:   Informed Consent: I have reviewed the patients History and Physical, chart, labs and discussed the procedure including the risks, benefits and alternatives for the proposed anesthesia with the patient or authorized representative who has indicated his/her understanding and acceptance.     Dental Advisory Given  Plan Discussed with: CRNA and Anesthesiologist  Anesthesia Plan Comments:         Anesthesia Quick Evaluation

## 2020-12-01 ENCOUNTER — Encounter: Payer: Self-pay | Admitting: Ophthalmology

## 2020-12-14 ENCOUNTER — Other Ambulatory Visit: Payer: Self-pay | Admitting: Internal Medicine

## 2020-12-14 NOTE — Telephone Encounter (Signed)
Valid encounter. Future visit in 1 month  

## 2020-12-16 ENCOUNTER — Other Ambulatory Visit: Payer: Self-pay

## 2020-12-16 ENCOUNTER — Ambulatory Visit (INDEPENDENT_AMBULATORY_CARE_PROVIDER_SITE_OTHER): Payer: Medicare Other | Admitting: Dermatology

## 2020-12-16 DIAGNOSIS — L719 Rosacea, unspecified: Secondary | ICD-10-CM | POA: Diagnosis not present

## 2020-12-16 DIAGNOSIS — L570 Actinic keratosis: Secondary | ICD-10-CM | POA: Diagnosis not present

## 2020-12-16 DIAGNOSIS — L720 Epidermal cyst: Secondary | ICD-10-CM | POA: Diagnosis not present

## 2020-12-16 DIAGNOSIS — L820 Inflamed seborrheic keratosis: Secondary | ICD-10-CM

## 2020-12-16 DIAGNOSIS — I255 Ischemic cardiomyopathy: Secondary | ICD-10-CM

## 2020-12-16 NOTE — Patient Instructions (Signed)
Cryotherapy Aftercare  . Wash gently with soap and water everyday.   . Apply Vaseline and Band-Aid daily until healed.  

## 2020-12-16 NOTE — Progress Notes (Signed)
New Patient Visit  Subjective  Monique Franklin is a 65 y.o. female who presents for the following: Spot Check (Pt has a few spots to check today that have been bothering her. ).  Objective  Well appearing patient in no apparent distress; mood and affect are within normal limits.  A focused examination was performed including face and right leg. Relevant physical exam findings are noted in the Assessment and Plan.  face x 17, right pretibial x 1 (18) Erythematous keratotic or waxy stuck-on papule or plaque.   left upper lip x 2 (2) Erythematous thin papules/macules with gritty scale.   face Mid face erythema with telangiectasias +/- scattered inflammatory papules.   Head - Anterior (Face) Smooth white papule(s).  Assessment & Plan  Inflamed seborrheic keratosis face x 17, right pretibial x 1  Prior to procedure, discussed risks of blister formation, small wound, skin dyspigmentation, or rare scar following cryotherapy. Recommend Vaseline ointment to treated areas while healing.   Destruction of lesion - face x 17, right pretibial x 1 Complexity: simple   Destruction method: cryotherapy   Informed consent: discussed and consent obtained   Timeout:  patient name, date of birth, surgical site, and procedure verified Lesion destroyed using liquid nitrogen: Yes   Region frozen until ice ball extended beyond lesion: Yes   Outcome: patient tolerated procedure well with no complications   Post-procedure details: wound care instructions given    AK (actinic keratosis) (2) left upper lip x 2  Actinic keratoses are precancerous spots that appear secondary to cumulative UV radiation exposure/sun exposure over time. They are chronic with expected duration over 1 year. A portion of actinic keratoses will progress to squamous cell carcinoma of the skin. It is not possible to reliably predict which spots will progress to skin cancer and so treatment is recommended to prevent development  of skin cancer.  Recommend daily broad spectrum sunscreen SPF 30+ to sun-exposed areas, reapply every 2 hours as needed.  Recommend staying in the shade or wearing long sleeves, sun glasses (UVA+UVB protection) and wide brim hats (4-inch brim around the entire circumference of the hat). Call for new or changing lesions.  Prior to procedure, discussed risks of blister formation, small wound, skin dyspigmentation, or rare scar following cryotherapy. Recommend Vaseline ointment to treated areas while healing.  Destruction of lesion - left upper lip x 2 Complexity: simple   Destruction method: cryotherapy   Informed consent: discussed and consent obtained   Timeout:  patient name, date of birth, surgical site, and procedure verified Lesion destroyed using liquid nitrogen: Yes   Region frozen until ice ball extended beyond lesion: Yes   Outcome: patient tolerated procedure well with no complications   Post-procedure details: wound care instructions given    Rosacea face Discussed BBL laser treatment as an option. Pt defers treatment today.   Rosacea is a chronic progressive skin condition usually affecting the face of adults, causing redness and/or acne bumps. It is treatable but not curable. It sometimes affects the eyes (ocular rosacea) as well. It may respond to topical and/or systemic medication and can flare with stress, sun exposure, alcohol, exercise and some foods.  Daily application of broad spectrum spf 30+ sunscreen to face is recommended to reduce flares.  Milia Head - Anterior (Face) Benign. Pt defers treatment today.  Discussed retinoids and extraction today.  Return in about 4 months (around 04/17/2021) for 3-4 month ISK/AK f/u.  IEpifania Gore, CMA, am acting as scribe for [employer]  Nehemiah Massed, MD. Documentation: I have reviewed the above documentation for accuracy and completeness, and I agree with the above.  Sarina Ser, MD

## 2020-12-21 ENCOUNTER — Encounter: Payer: Self-pay | Admitting: Dermatology

## 2020-12-23 ENCOUNTER — Encounter: Payer: Self-pay | Admitting: Cardiology

## 2020-12-23 ENCOUNTER — Ambulatory Visit (INDEPENDENT_AMBULATORY_CARE_PROVIDER_SITE_OTHER): Payer: Medicare Other | Admitting: Cardiology

## 2020-12-23 ENCOUNTER — Other Ambulatory Visit: Payer: Self-pay

## 2020-12-23 VITALS — BP 110/50 | HR 72 | Ht 63.0 in | Wt 187.0 lb

## 2020-12-23 DIAGNOSIS — I255 Ischemic cardiomyopathy: Secondary | ICD-10-CM

## 2020-12-23 DIAGNOSIS — Z95810 Presence of automatic (implantable) cardiac defibrillator: Secondary | ICD-10-CM | POA: Diagnosis not present

## 2020-12-23 DIAGNOSIS — I2510 Atherosclerotic heart disease of native coronary artery without angina pectoris: Secondary | ICD-10-CM

## 2020-12-23 NOTE — Progress Notes (Signed)
Cardiology Office Note:    Date:  12/23/2020   ID:  Mckell Riecke, DOB 08/09/1955, MRN [id number]  PCP:  Lorre Munroe, NP  Cardiologist:  Debbe Odea, MD  Electrophysiologist:  None   Referring MD: Lorre Munroe, NP   Chief Complaint  Patient presents with   Other    6 month follow up. Meds reviewed verbally with patient.     History of Present Illness:    Monique Franklin is a 65 y.o. female with a hx of hypertension, hyperlipidemia, former smoker, CAD, PCI to LAD and LCx(5 stents total), status post CABG x2 (LIMA to LAD, SVG to OM 2019), HFrEF EF, s/p ICD 2020([employer] device), PAD(left femoral thrombo-endarterectomy with patch angioplasty 12/2017) who presents for follow-up.  Patient being seen for CAD, CHF. Tolerating heart failure medications, denies chest pain, dizziness or syncope.  Had recent cataract surgery, states seeing some halos when driving.  Follow-up appointment with ophthalmology scheduled.  Was seen at CHF clinic, repeat echocardiogram scheduled for November.  She feels well, has no concerns at this time.   Prior notes Patient was originally seen to establish care.  She used to be followed at [facility] and later moved into the area.  After her CABG in 2019, she had a left femoral artery complication requiring left femoral patch angioplasty.  Earlier in 2020, she had an ICD placed due to low EF with ejection fraction of 31%.  She is a former smoker for about 20 years.   Past Medical History:  Diagnosis Date   Cardiomyopathy (HCC)    CHF (congestive heart failure) (HCC)    Class I   Coronary artery disease    Diabetes mellitus without complication (HCC)    GERD (gastroesophageal reflux disease)    HFrEF (heart failure with reduced ejection fraction) (HCC)    Hyperlipidemia    Hypertension    MI (myocardial infarction) (HCC)    PAD (peripheral artery disease) (HCC)     Past Surgical History:  Procedure Laterality Date   BREAST BIOPSY  Bilateral 2016   neg   CARDIAC CATHETERIZATION     CARDIAC DEFIBRILLATOR PLACEMENT     CATARACT EXTRACTION Bilateral    CATARACT EXTRACTION W/PHACO Right 11/09/2020   Procedure: CATARACT EXTRACTION PHACO AND INTRAOCULAR LENS PLACEMENT (IOC) RIGHT VIVITY  DIABETIC;  Surgeon: Galen Manila, MD;  Location: [facility];  Service: Ophthalmology;  Laterality: Right;  2.85 00:33.2   CATARACT EXTRACTION W/PHACO Left 11/30/2020   Procedure: CATARACT EXTRACTION PHACO AND INTRAOCULAR LENS PLACEMENT (IOC) LEFT DIABETIC VIVITY 3.86 00:28.3;  Surgeon: Galen Manila, MD;  Location: [facility];  Service: Ophthalmology;  Laterality: Left;   COLONOSCOPY WITH PROPOFOL N/A 10/31/2019   Procedure: COLONOSCOPY WITH PROPOFOL;  Surgeon: Pasty Spillers, MD;  Location: [facility];  Service: Endoscopy;  Laterality: N/A;  priority 4   COMBINED AUGMENTATION MAMMAPLASTY AND ABDOMINOPLASTY     CORONARY ARTERY BYPASS GRAFT  08/2017   X2 with LIMA to LAD; 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;Severe 3 vessel CAD-PCI to LAD and LC x(5stents) Coronary dissection of LM to LAD    CORONARY ARTERY BYPASS GRAFT     CORONARY STENT PLACEMENT     PERCUTANEOUS CORONARY STENT INTERVENTION (PCI-S)     REDUCTION MAMMAPLASTY Bilateral 2017   TONSILLECTOMY      Current Medications: Current Meds  Medication Sig   aspirin EC 81 MG tablet Take 81 mg by mouth daily.   carvedilol (COREG) 25 MG tablet Take 1 tablet (25 mg  total) by mouth 2 (two) times daily.   clobetasol ointment (TEMOVATE) 0.05 % Apply to affected area every night for 4 weeks, then every other day for 4 weeks and then twice a week for 4 weeks or until resolution.   dapagliflozin propanediol (FARXIGA) 10 MG TABS tablet Take 1 tablet (10 mg total) by mouth daily.   glimepiride (AMARYL) 2 MG tablet TAKE 1 TABLET BY MOUTH  DAILY WITH BREAKFAST   icosapent Ethyl (VASCEPA) 1 g capsule Take 2 capsules (2 g total) by mouth 2 (two) times daily.   metFORMIN  (GLUCOPHAGE) 1000 MG tablet TAKE ONE-HALF TABLET BY  MOUTH TWICE DAILY WITH A  MEAL   Multiple Vitamin (MULTIVITAMIN) tablet Take 1 tablet by mouth daily.   oxybutynin (DITROPAN-XL) 5 MG 24 hr tablet TAKE 1 TABLET(5 MG) BY MOUTH AT BEDTIME   rosuvastatin (CRESTOR) 40 MG tablet Take 1 tablet (40 mg total) by mouth daily at 6 PM.   sacubitril-valsartan (ENTRESTO) 97-103 MG Take 1 tablet by mouth 2 (two) times daily.   spironolactone (ALDACTONE) 25 MG tablet Take 1 tablet (25 mg total) by mouth daily.     Allergies:   Ticagrelor   Social History   Socioeconomic History   Marital status: Widowed    Spouse name: Not on file   Number of children: Not on file   Years of education: Not on file   Highest education level: Not on file  Occupational History   Not on file  Tobacco Use   Smoking status: Former    Packs/day: 1.00    Years: 33.00    Pack years: 33.00    Types: Cigarettes   Smokeless tobacco: Never  Vaping Use   Vaping Use: Never used  Substance and Sexual Activity   Alcohol use: Yes    Comment: occasional   Drug use: Never   Sexual activity: Not Currently    Birth control/protection: Post-menopausal  Other Topics Concern   Not on file  Social History Narrative   Not on file   Social Determinants of Health   Financial Resource Strain: Not on file  Food Insecurity: Not on file  Transportation Needs: Not on file  Physical Activity: Not on file  Stress: Not on file  Social Connections: Not on file     Family History: The patient's family history includes Breast cancer (age of onset: 34) in her sister; COPD in her father; Diabetes in her brother and sister; Heart attack in her mother; Thyroid cancer in her sister.  ROS:   Please see the history of present illness.     All other systems reviewed and are negative.  EKGs/Labs/Other Studies Reviewed:    The following studies were reviewed today:  EKG:  EKG is ordered today.  EKG shows normal sinus rhythm  Recent  Labs: 05/12/2020: ALT 15; Hemoglobin 12.3; Platelets 261.0 08/31/2020: BUN 24; Creatinine, Ser 1.15; Potassium 4.5; Sodium 136  Recent Lipid Panel    Component Value Date/Time   CHOL 137 08/31/2020 0922   TRIG 266 (H) 08/31/2020 0922   HDL 44 08/31/2020 0922   CHOLHDL 3.1 08/31/2020 0922   VLDL 53 (H) 08/31/2020 0922   LDLCALC 40 08/31/2020 0922   LDLDIRECT 59.0 05/12/2020 1537    Physical Exam:    VS:  BP (!) 110/50 (BP Location: Left Arm, Patient Position: Sitting, Cuff Size: Normal)   Pulse 72   Ht 5\' 3"  (1.6 m)   Wt 187 lb (84.8 kg)   SpO2 97%  BMI 33.13 kg/m     Wt Readings from Last 3 Encounters:  12/23/20 187 lb (84.8 kg)  11/30/20 187 lb (84.8 kg)  11/09/20 189 lb (85.7 kg)     GEN:  Well nourished, well developed in no acute distress HEENT: Normal NECK: No JVD; No carotid bruits LYMPHATICS: No lymphadenopathy CARDIAC: RRR, no murmurs, rubs, gallops RESPIRATORY:  Clear to auscultation without rales, wheezing or rhonchi  ABDOMEN: Soft, non-tender, non-distended MUSCULOSKELETAL:  No edema; No deformity  SKIN: Warm and dry NEUROLOGIC:  Alert and oriented x 3 PSYCHIATRIC:  Normal affect   ASSESSMENT:    1. Ischemic cardiomyopathy   2. Coronary artery disease involving native coronary artery of native heart without angina pectoris   3. ICD (implantable cardioverter-defibrillator) in place     PLAN:    In order of problems listed above:   ischemic cardiomyopathy last EF 35-40%%, status post ICD.  NYHA class II symptoms.  Blood pressure is low normal.  Patient is on optimal CHF medications including Coreg 25 mg twice daily, Entresto 97-1 03, Aldactone 25 mg daily.  Continue Farxiga 10 mg daily.  Repeat echocardiogram in 2 months  CAD, PCI's, CABG x2.  No current symptoms of angina.  Continue aspirin 81 mg, Crestor 40 mg daily. ICD in place for ischemic cardiomyopathy.  Keep device checks appointment with device clinic.   Follow-up in 6 months  Total  encounter time 40 minutes  Greater than 50% was spent in counseling and coordination of care with the patient   Medication Adjustments/Labs and Tests Ordered: Current medicines are reviewed at length with the patient today.  Concerns regarding medicines are outlined above.  Orders Placed This Encounter  Procedures   EKG 12-Lead    No orders of the defined types were placed in this encounter.   Patient Instructions  Medication Instructions:  Your physician recommends that you continue on your current medications as directed. Please refer to the Current Medication list given to you today.  *If you need a refill on your cardiac medications before your next appointment, please call your pharmacy*   Lab Work: None ordered If you have labs (blood work) drawn today and your tests are completely normal, you will receive your results only by: MyChart Message (if you have MyChart) OR A paper copy in the mail If you have any lab test that is abnormal or we need to change your treatment, we will call you to review the results.   Testing/Procedures: None ordered   Follow-Up: At [facility], you and your health needs are our priority.  As part of our continuing mission to provide you with exceptional heart care, we have created designated Provider Care Teams.  These Care Teams include your primary Cardiologist (physician) and Advanced Practice Providers (APPs -  Physician Assistants and Nurse Practitioners) who all work together to provide you with the care you need, when you need it.  We recommend signing up for the patient portal called "MyChart".  Sign up information is provided on this After Visit Summary.  MyChart is used to connect with patients for Virtual Visits (Telemedicine).  Patients are able to view lab/test results, encounter notes, upcoming appointments, etc.  Non-urgent messages can be sent to your provider as well.   To learn more about what you can do with MyChart, go to  [email or web address].    Your next appointment:   6 month(s)  The format for your next appointment:   In Person  Provider:   You  may see Debbe Odea, MD or one of the following Advanced Practice Providers on your designated Care Team:   Nicolasa Ducking, NP Eula Listen, PA-C Marisue Ivan, PA-C Cadence Coburn, [place]   Other Instructions    Signed, Debbe Odea, MD  12/23/2020 11:21 AM    [facility]

## 2020-12-23 NOTE — Patient Instructions (Signed)
Medication Instructions:  Your physician recommends that you continue on your current medications as directed. Please refer to the Current Medication list given to you today.  *If you need a refill on your cardiac medications before your next appointment, please call your pharmacy*   Lab Work: None ordered If you have labs (blood work) drawn today and your tests are completely normal, you will receive your results only by: MyChart Message (if you have MyChart) OR A paper copy in the mail If you have any lab test that is abnormal or we need to change your treatment, we will call you to review the results.   Testing/Procedures: None ordered   Follow-Up: At [facility], you and your health needs are our priority.  As part of our continuing mission to provide you with exceptional heart care, we have created designated Provider Care Teams.  These Care Teams include your primary Cardiologist (physician) and Advanced Practice Providers (APPs -  Physician Assistants and Nurse Practitioners) who all work together to provide you with the care you need, when you need it.  We recommend signing up for the patient portal called "MyChart".  Sign up information is provided on this After Visit Summary.  MyChart is used to connect with patients for Virtual Visits (Telemedicine).  Patients are able to view lab/test results, encounter notes, upcoming appointments, etc.  Non-urgent messages can be sent to your provider as well.   To learn more about what you can do with MyChart, go to [email or web address].    Your next appointment:   6 month(s)  The format for your next appointment:   In Person  Provider:   You may see Brian Agbor-Etang, MD or one of the following Advanced Practice Providers on your designated Care Team:   Christopher Berge, NP Ryan Dunn, PA-C Jacquelyn Visser, PA-C Cadence Furth, PA-C   Other Instructions   

## 2021-01-18 ENCOUNTER — Other Ambulatory Visit: Payer: Self-pay

## 2021-01-18 ENCOUNTER — Ambulatory Visit (INDEPENDENT_AMBULATORY_CARE_PROVIDER_SITE_OTHER): Payer: Medicare Other | Admitting: Internal Medicine

## 2021-01-18 ENCOUNTER — Encounter: Payer: Self-pay | Admitting: Internal Medicine

## 2021-01-18 VITALS — BP 95/57 | HR 75 | Temp 97.1°F | Resp 18 | Ht 63.0 in | Wt 185.8 lb

## 2021-01-18 DIAGNOSIS — E6609 Other obesity due to excess calories: Secondary | ICD-10-CM

## 2021-01-18 DIAGNOSIS — E785 Hyperlipidemia, unspecified: Secondary | ICD-10-CM

## 2021-01-18 DIAGNOSIS — I428 Other cardiomyopathies: Secondary | ICD-10-CM

## 2021-01-18 DIAGNOSIS — I739 Peripheral vascular disease, unspecified: Secondary | ICD-10-CM

## 2021-01-18 DIAGNOSIS — I2510 Atherosclerotic heart disease of native coronary artery without angina pectoris: Secondary | ICD-10-CM

## 2021-01-18 DIAGNOSIS — N1831 Chronic kidney disease, stage 3a: Secondary | ICD-10-CM | POA: Diagnosis not present

## 2021-01-18 DIAGNOSIS — I255 Ischemic cardiomyopathy: Secondary | ICD-10-CM | POA: Diagnosis not present

## 2021-01-18 DIAGNOSIS — Z23 Encounter for immunization: Secondary | ICD-10-CM | POA: Diagnosis not present

## 2021-01-18 DIAGNOSIS — I509 Heart failure, unspecified: Secondary | ICD-10-CM

## 2021-01-18 DIAGNOSIS — I219 Acute myocardial infarction, unspecified: Secondary | ICD-10-CM

## 2021-01-18 DIAGNOSIS — Z6832 Body mass index (BMI) 32.0-32.9, adult: Secondary | ICD-10-CM

## 2021-01-18 DIAGNOSIS — E1165 Type 2 diabetes mellitus with hyperglycemia: Secondary | ICD-10-CM

## 2021-01-18 DIAGNOSIS — E1169 Type 2 diabetes mellitus with other specified complication: Secondary | ICD-10-CM

## 2021-01-18 DIAGNOSIS — I10 Essential (primary) hypertension: Secondary | ICD-10-CM

## 2021-01-18 NOTE — Assessment & Plan Note (Signed)
Continue Rosuvastatin, Carvedilol, Vascepa and Aspirin Encourage low-fat diet She will continue to see cardiology, will follow

## 2021-01-18 NOTE — Progress Notes (Signed)
Subjective:    Patient ID: Monique Franklin, female    DOB: [birth year], 65 y.o.   MRN: [id number]  HPI  Patient presents to clinic today for follow-up of chronic conditions.  HTN with cardiomyopathy: Her BP today is 95/57.  She does not feel lightheaded or like she is going to pass out.  She is taking Carvedilol, Spironolactone and Entresto as prescribed.  ECG from 12/2020 reviewed.  HLD with CAD, PAD status post MI with Stents: Her last LDL was 40, triglycerides 412, 08/2020.  She denies myalgias on Rosuvastatin and Vascepa.  She is taking Carvedilol and Aspirin as prescribed.  She follows with cardiology.  CHF: She denies chronic cough, shortness of breath or lower extremity edema.  She is taking Entresto, Spironolactone and Carvedilol as prescribed.  Echo from 08/2020 reviewed.  She follows with cardiology.  DM2: Her last A1c was 8.2%, 08/2020.  She is taking Metformin, Glimepiride and Farxiga as prescribed.  She checks her feet routinely.  She does not check her sugars.  Her last eye exam was 08/2020.  Flu 05/2020.  Pneumovax never.  Prevnar 10/2020.  COVID-[employer] x3  CKD: Her last creatinine was 1.15, GFR 53, 08/2020.  She is non Valsartan.  She does not follow with nephrology.  Review of Systems     Past Medical History:  Diagnosis Date   Cardiomyopathy (HCC)    CHF (congestive heart failure) (HCC)    Class I   Coronary artery disease    Diabetes mellitus without complication (HCC)    GERD (gastroesophageal reflux disease)    HFrEF (heart failure with reduced ejection fraction) (HCC)    Hyperlipidemia    Hypertension    MI (myocardial infarction) (HCC)    PAD (peripheral artery disease) (HCC)     Current Outpatient Medications  Medication Sig Dispense Refill   aspirin EC 81 MG tablet Take 81 mg by mouth daily.     carvedilol (COREG) 25 MG tablet Take 1 tablet (25 mg total) by mouth 2 (two) times daily. 180 tablet 1   clobetasol ointment (TEMOVATE) 0.05 % Apply to affected area  every night for 4 weeks, then every other day for 4 weeks and then twice a week for 4 weeks or until resolution. 30 g 5   dapagliflozin propanediol (FARXIGA) 10 MG TABS tablet Take 1 tablet (10 mg total) by mouth daily. 90 tablet 3   glimepiride (AMARYL) 2 MG tablet TAKE 1 TABLET BY MOUTH  DAILY WITH BREAKFAST 90 tablet 1   icosapent Ethyl (VASCEPA) 1 g capsule Take 2 capsules (2 g total) by mouth 2 (two) times daily. 360 capsule 3   metFORMIN (GLUCOPHAGE) 1000 MG tablet TAKE ONE-HALF TABLET BY  MOUTH TWICE DAILY WITH A  MEAL 90 tablet 1   Multiple Vitamin (MULTIVITAMIN) tablet Take 1 tablet by mouth daily.     oxybutynin (DITROPAN-XL) 5 MG 24 hr tablet TAKE 1 TABLET(5 MG) BY MOUTH AT BEDTIME 90 tablet 3   rosuvastatin (CRESTOR) 40 MG tablet Take 1 tablet (40 mg total) by mouth daily at 6 PM. 90 tablet 3   sacubitril-valsartan (ENTRESTO) 97-103 MG Take 1 tablet by mouth 2 (two) times daily. 180 tablet 3   spironolactone (ALDACTONE) 25 MG tablet Take 1 tablet (25 mg total) by mouth daily. 90 tablet 3   No current facility-administered medications for this visit.    Allergies  Allergen Reactions   Ticagrelor Rash    Family History  Problem Relation Age of Onset  Heart attack Mother    COPD Father    Thyroid cancer Sister    Breast cancer Sister 34   Diabetes Sister    Diabetes Brother     Social History   Socioeconomic History   Marital status: Widowed    Spouse name: Not on file   Number of children: Not on file   Years of education: Not on file   Highest education level: Not on file  Occupational History   Not on file  Tobacco Use   Smoking status: Former    Packs/day: 1.00    Years: 33.00    Pack years: 33.00    Types: Cigarettes   Smokeless tobacco: Never  Vaping Use   Vaping Use: Never used  Substance and Sexual Activity   Alcohol use: Yes    Comment: occasional   Drug use: Never   Sexual activity: Not Currently    Birth control/protection: Post-menopausal   Other Topics Concern   Not on file  Social History Narrative   Not on file   Social Determinants of Health   Financial Resource Strain: Not on file  Food Insecurity: Not on file  Transportation Needs: Not on file  Physical Activity: Not on file  Stress: Not on file  Social Connections: Not on file  Intimate Partner Violence: Not on file     Constitutional: Denies fever, malaise, fatigue, headache or abrupt weight changes.  HEENT: Patient reports blurry vision (recently had cataract surgery).  Denies eye pain, eye redness, ear pain, ringing in the ears, wax buildup, runny nose, nasal congestion, bloody nose, or sore throat. Respiratory: Denies difficulty breathing, shortness of breath, cough or sputum production.   Cardiovascular: Denies chest pain, chest tightness, palpitations or swelling in the hands or feet.  Musculoskeletal: Denies decrease in range of motion, difficulty with gait, muscle pain or joint pain and swelling.  Skin: Denies redness, rashes, lesions or ulcercations.  Neurological: Denies dizziness, difficulty with memory, difficulty with speech or problems with balance and coordination.    No other specific complaints in a complete review of systems (except as listed in HPI above).  Objective:   Physical Exam BP (!) 95/57 (BP Location: Right Arm, Patient Position: Sitting, Cuff Size: Large)   Pulse 75   Temp (!) 97.1 F (36.2 C) (Temporal)   Resp 18   Ht 5\' 3"  (1.6 m)   Wt 185 lb 12.8 oz (84.3 kg)   SpO2 99%   BMI 32.91 kg/m   Wt Readings from Last 3 Encounters:  12/23/20 187 lb (84.8 kg)  11/30/20 187 lb (84.8 kg)  11/09/20 189 lb (85.7 kg)    General: Appears her stated age, obese, in NAD. Skin: Warm, dry and intact. No ulcerations noted. HEENT: Head: normal shape and size; Eyes: EOMs intact;  Cardiovascular: Normal rate and rhythm. S1,S2 noted.  No murmur, rubs or gallops noted. No JVD or BLE edema. No carotid bruits noted. Pulmonary/Chest: Normal  effort and positive vesicular breath sounds. No respiratory distress. No wheezes, rales or ronchi noted.  Musculoskeletal: No difficulty with gait.  Neurological: Alert and oriented.    BMET    Component Value Date/Time   NA 136 08/31/2020 0922   NA 139 06/12/2019 0840   K 4.5 08/31/2020 0922   CL 106 08/31/2020 0922   CO2 22 08/31/2020 0922   GLUCOSE 262 (H) 08/31/2020 0922   BUN 24 (H) 08/31/2020 0922   BUN 28 (H) 06/12/2019 0840   CREATININE 1.15 (H) 08/31/2020 09/02/2020  CALCIUM 9.6 08/31/2020 0922   GFRNONAA 53 (L) 08/31/2020 0922   GFRAA >60 11/03/2019 1139    Lipid Panel     Component Value Date/Time   CHOL 137 08/31/2020 0922   TRIG 266 (H) 08/31/2020 0922   HDL 44 08/31/2020 0922   CHOLHDL 3.1 08/31/2020 0922   VLDL 53 (H) 08/31/2020 0922   LDLCALC 40 08/31/2020 0922    CBC    Component Value Date/Time   WBC 9.1 05/12/2020 1537   RBC 4.37 05/12/2020 1537   HGB 12.3 05/12/2020 1537   HCT 37.2 05/12/2020 1537   PLT 261.0 05/12/2020 1537   MCV 85.0 05/12/2020 1537   MCHC 33.1 05/12/2020 1537   RDW 15.4 05/12/2020 1537    Hgb A1C Lab Results  Component Value Date   HGBA1C 8.2 (A) 10/13/2020            Assessment & Plan:   Nicki Reaper, NP This visit occurred during the SARS-CoV-2 public health emergency.  Safety protocols were in place, including screening questions prior to the visit, additional usage of staff PPE, and extensive cleaning of exam room while observing appropriate contact time as indicated for disinfecting solutions.

## 2021-01-18 NOTE — Assessment & Plan Note (Signed)
A1c today Encourage low-carb diet and exercise for weight loss Continue Metformin, Glimepiride and Farxiga Discussed referral to diabetes education and nutrition but she would like to hold off at this time Encourage routine eye exams Encourage routine foot exams Flu shot today Will give Pneumovax in 1 year Encouraged her to get her COVID booster

## 2021-01-18 NOTE — Patient Instructions (Signed)
Carbohydrate Counting for Diabetes Mellitus, Adult Carbohydrate counting is a method of keeping track of how many carbohydrates you eat. Eating carbohydrates naturally increases the amount of sugar (glucose) in the blood. Counting how many carbohydrates you eat improves your blood glucose control, which helps you manage your diabetes. It is important to know how many carbohydrates you can safely have in each meal. This is different for every person. A dietitian can help you make a meal plan and calculate how many carbohydrates you should have at each meal and snack. What foods contain carbohydrates? Carbohydrates are found in the following foods: Grains, such as breads and cereals. Dried beans and soy products. Starchy vegetables, such as potatoes, peas, and corn. Fruit and fruit juices. Milk and yogurt. Sweets and snack foods, such as cake, cookies, candy, chips, and soft drinks. How do I count carbohydrates in foods? There are two ways to count carbohydrates in food. You can read food labels or learn standard serving sizes of foods. You can use either of the methods or a combination of both. Using the Nutrition Facts label The Nutrition Facts list is included on the labels of almost all packaged foods and beverages in the [place] It includes: The serving size. Information about nutrients in each serving, including the grams (g) of carbohydrate per serving. To use the Nutrition Facts: Decide how many servings you will have. Multiply the number of servings by the number of carbohydrates per serving. The resulting number is the total amount of carbohydrates that you will be having. Learning the standard serving sizes of foods When you eat carbohydrate foods that are not packaged or do not include Nutrition Facts on the label, you need to measure the servings in order to count the amount of carbohydrates. Measure the foods that you will eat with a food scale or measuring cup, if needed. Decide how  many standard-size servings you will eat. Multiply the number of servings by 15. For foods that contain carbohydrates, one serving equals 15 g of carbohydrates. For example, if you eat 2 cups or 10 oz (300 g) of strawberries, you will have eaten 2 servings and 30 g of carbohydrates (2 servings x 15 g = 30 g). For foods that have more than one food mixed, such as soups and casseroles, you must count the carbohydrates in each food that is included. The following list contains standard serving sizes of common carbohydrate-rich foods. Each of these servings has about 15 g of carbohydrates: 1 slice of bread. 1 six-inch (15 cm) tortilla. ? cup or 2 oz (53 g) cooked rice or pasta.  cup or 3 oz (85 g) cooked or canned, drained and rinsed beans or lentils.  cup or 3 oz (85 g) starchy vegetable, such as peas, corn, or squash.  cup or 4 oz (120 g) hot cereal.  cup or 3 oz (85 g) boiled or mashed potatoes, or  or 3 oz (85 g) of a large baked potato.  cup or 4 fl oz (118 mL) fruit juice. 1 cup or 8 fl oz (237 mL) milk. 1 small or 4 oz (106 g) apple.  or 2 oz (63 g) of a medium banana. 1 cup or 5 oz (150 g) strawberries. 3 cups or 1 oz (24 g) popped popcorn. What is an example of carbohydrate counting? To calculate the number of carbohydrates in this sample meal, follow the steps shown below. Sample meal 3 oz (85 g) chicken breast. ? cup or 4 oz (106 g) brown   rice.  cup or 3 oz (85 g) corn. 1 cup or 8 fl oz (237 mL) milk. 1 cup or 5 oz (150 g) strawberries with sugar-free whipped topping. Carbohydrate calculation Identify the foods that contain carbohydrates: Rice. Corn. Milk. Strawberries. Calculate how many servings you have of each food: 2 servings rice. 1 serving corn. 1 serving milk. 1 serving strawberries. Multiply each number of servings by 15 g: 2 servings rice x 15 g = 30 g. 1 serving corn x 15 g = 15 g. 1 serving milk x 15 g = 15 g. 1 serving strawberries x 15 g = 15  g. Add together all of the amounts to find the total grams of carbohydrates eaten: 30 g + 15 g + 15 g + 15 g = 75 g of carbohydrates total. What are tips for following this plan? Shopping Develop a meal plan and then make a shopping list. Buy fresh and frozen vegetables, fresh and frozen fruit, dairy, eggs, beans, lentils, and whole grains. Look at food labels. Choose foods that have more fiber and less sugar. Avoid processed foods and foods with added sugars. Meal planning Aim to have the same amount of carbohydrates at each meal and for each snack time. Plan to have regular, balanced meals and snacks. Where to find more information American Diabetes Association: www.diabetes.org Centers for Disease Control and Prevention: www.cdc.gov Summary Carbohydrate counting is a method of keeping track of how many carbohydrates you eat. Eating carbohydrates naturally increases the amount of sugar (glucose) in the blood. Counting how many carbohydrates you eat improves your blood glucose control, which helps you manage your diabetes. A dietitian can help you make a meal plan and calculate how many carbohydrates you should have at each meal and snack. This information is not intended to replace advice given to you by your health care provider. Make sure you discuss any questions you have with your health care provider. Document Revised: 03/27/2019 Document Reviewed: 03/28/2019 Elsevier Patient Education  2021 Elsevier Inc.  

## 2021-01-18 NOTE — Assessment & Plan Note (Signed)
Continue Carvedilol, Entresto and Spironolactone She will continue to see cardiology, will follow

## 2021-01-18 NOTE — Assessment & Plan Note (Signed)
Compensated Continue Carvedilol, Spironolactone and Entresto C-Met today She will continue to see cardiology, will follow

## 2021-01-18 NOTE — Assessment & Plan Note (Addendum)
Continue Rosuvastatin, Vascepa and Aspirin Encourage low-fat diet She will continue to see cardiology, will follow

## 2021-01-18 NOTE — Assessment & Plan Note (Signed)
No angina Lipid profile today Continue Rosuvastatin, Vascepa and Aspirin Encourage low-fat diet She will continue to see cardiology, will follow

## 2021-01-18 NOTE — Assessment & Plan Note (Signed)
C-Met and lipid profile today Continue Rosuvastatin, Vascepa and Aspirin Encourage low-fat diet She will continue to see cardiology, will follow

## 2021-01-18 NOTE — Assessment & Plan Note (Signed)
Encourage diet and exercise for weight loss 

## 2021-01-18 NOTE — Assessment & Plan Note (Addendum)
Continue Carvedilol, Spironolactone and Entresto C-Met today Reinforced DASH diet and exercise for weight loss She will continue to see cardiology, will follow

## 2021-01-18 NOTE — Assessment & Plan Note (Addendum)
C-Met today Continue Valsartan for renal protection

## 2021-01-19 LAB — COMPLETE METABOLIC PANEL WITH GFR
AG Ratio: 1.3 (calc) (ref 1.0–2.5)
ALT: 12 U/L (ref 6–29)
AST: 13 U/L (ref 10–35)
Albumin: 4.2 g/dL (ref 3.6–5.1)
Alkaline phosphatase (APISO): 52 U/L (ref 37–153)
BUN/Creatinine Ratio: 22 (calc) (ref 6–22)
BUN: 27 mg/dL — ABNORMAL HIGH (ref 7–25)
CO2: 25 mmol/L (ref 20–32)
Calcium: 10.2 mg/dL (ref 8.6–10.4)
Chloride: 106 mmol/L (ref 98–110)
Creat: 1.24 mg/dL — ABNORMAL HIGH (ref 0.50–1.05)
Globulin: 3.2 g/dL (calc) (ref 1.9–3.7)
Glucose, Bld: 216 mg/dL — ABNORMAL HIGH (ref 65–99)
Potassium: 4.5 mmol/L (ref 3.5–5.3)
Sodium: 139 mmol/L (ref 135–146)
Total Bilirubin: 0.3 mg/dL (ref 0.2–1.2)
Total Protein: 7.4 g/dL (ref 6.1–8.1)
eGFR: 48 mL/min/{1.73_m2} — ABNORMAL LOW (ref >=60)

## 2021-01-19 LAB — LIPID PANEL
Cholesterol: 131 mg/dL (ref <200)
HDL: 46 mg/dL — ABNORMAL LOW (ref >=50)
LDL Cholesterol (Calc): 55 mg/dL (calc)
Non-HDL Cholesterol (Calc): 85 mg/dL (calc) (ref <130)
Total CHOL/HDL Ratio: 2.8 (calc) (ref <5.0)
Triglycerides: 237 mg/dL — ABNORMAL HIGH (ref <150)

## 2021-01-19 LAB — HEMOGLOBIN A1C
Hgb A1c MFr Bld: 8.8 % of total Hgb — ABNORMAL HIGH (ref <5.7)
Mean Plasma Glucose: 206 mg/dL
eAG (mmol/L): 11.4 mmol/L

## 2021-01-24 ENCOUNTER — Ambulatory Visit (INDEPENDENT_AMBULATORY_CARE_PROVIDER_SITE_OTHER): Payer: Medicare Other

## 2021-01-24 DIAGNOSIS — I255 Ischemic cardiomyopathy: Secondary | ICD-10-CM

## 2021-01-27 LAB — CUP PACEART REMOTE DEVICE CHECK
Battery Remaining Longevity: 77 mo
Battery Remaining Percentage: 74 %
Battery Voltage: 2.98 V
Brady Statistic RV Percent Paced: 1 %
Date Time Interrogation Session: 20221017020019
HighPow Impedance: 84 Ohm
HighPow Impedance: 84 Ohm
Implantable Lead Implant Date: 20200707
Implantable Lead Location: 753860
Implantable Pulse Generator Implant Date: 20200707
Lead Channel Impedance Value: 810 Ohm
Lead Channel Pacing Threshold Amplitude: 0.5 V
Lead Channel Pacing Threshold Pulse Width: 0.5 ms
Lead Channel Sensing Intrinsic Amplitude: 11.7 mV
Lead Channel Setting Pacing Amplitude: 2.5 V
Lead Channel Setting Pacing Pulse Width: 0.5 ms
Lead Channel Setting Sensing Sensitivity: 0.5 mV
Pulse Gen Serial Number: 9816687

## 2021-01-31 ENCOUNTER — Encounter: Payer: Self-pay | Admitting: Obstetrics and Gynecology

## 2021-01-31 ENCOUNTER — Other Ambulatory Visit: Payer: Self-pay

## 2021-01-31 ENCOUNTER — Ambulatory Visit (INDEPENDENT_AMBULATORY_CARE_PROVIDER_SITE_OTHER): Payer: Medicare Other | Admitting: Obstetrics and Gynecology

## 2021-01-31 VITALS — BP 100/70 | Ht 63.0 in | Wt 189.0 lb

## 2021-01-31 DIAGNOSIS — R875 Abnormal microbiological findings in specimens from female genital organs: Secondary | ICD-10-CM | POA: Diagnosis not present

## 2021-01-31 DIAGNOSIS — B3731 Acute candidiasis of vulva and vagina: Secondary | ICD-10-CM | POA: Diagnosis not present

## 2021-01-31 DIAGNOSIS — Z1329 Encounter for screening for other suspected endocrine disorder: Secondary | ICD-10-CM

## 2021-01-31 DIAGNOSIS — N904 Leukoplakia of vulva: Secondary | ICD-10-CM

## 2021-01-31 DIAGNOSIS — R350 Frequency of micturition: Secondary | ICD-10-CM

## 2021-01-31 MED ORDER — TERCONAZOLE 0.4 % VA CREA: 1.0000 | TOPICAL_CREAM | Freq: Every day | VAGINAL | 0 refills | Status: DC

## 2021-01-31 NOTE — Progress Notes (Signed)
Patient ID: Monique Franklin, female   DOB: 12-26-1955, 65 y.o.   MRN: [id number]  Reason for Consult: Routine Prenatal Visit and Follow-up   Referred by Lorre Munroe, NP  Subjective:     HPI:  Monique Franklin is a 65 y.o. female she presents today for follow-up regarding vulvar itching.  She reports that she continues to have some vulvar itching and irritation.  She has been applying clobetasol nightly.  She is also treated at her last visit for urinary frequency by initiation of Ditropan which she has been happy with.  She reports that she is having less accidents and no longer has to wear a pad.  Gynecological History  No LMP recorded. Patient is postmenopausal.   Past Medical History:  Diagnosis Date   Cardiomyopathy (HCC)    CHF (congestive heart failure) (HCC)    Class I   Coronary artery disease    Diabetes mellitus without complication (HCC)    GERD (gastroesophageal reflux disease)    HFrEF (heart failure with reduced ejection fraction) (HCC)    Hyperlipidemia    Hypertension    MI (myocardial infarction) (HCC)    PAD (peripheral artery disease) (HCC)    Family History  Problem Relation Age of Onset   Heart attack Mother    COPD Father    Thyroid cancer Sister    Breast cancer Sister 79   Diabetes Sister    Diabetes Brother    Past Surgical History:  Procedure Laterality Date   BREAST BIOPSY Bilateral 2016   neg   CARDIAC CATHETERIZATION     CARDIAC DEFIBRILLATOR PLACEMENT     CATARACT EXTRACTION Bilateral    CATARACT EXTRACTION W/PHACO Right 11/09/2020   Procedure: CATARACT EXTRACTION PHACO AND INTRAOCULAR LENS PLACEMENT (IOC) RIGHT VIVITY  DIABETIC;  Surgeon: Galen Manila, MD;  Location: [facility];  Service: Ophthalmology;  Laterality: Right;  2.85 00:33.2   CATARACT EXTRACTION W/PHACO Left 11/30/2020   Procedure: CATARACT EXTRACTION PHACO AND INTRAOCULAR LENS PLACEMENT (IOC) LEFT DIABETIC VIVITY 3.86 00:28.3;  Surgeon: Galen Manila,  MD;  Location: [facility];  Service: Ophthalmology;  Laterality: Left;   COLONOSCOPY WITH PROPOFOL N/A 10/31/2019   Procedure: COLONOSCOPY WITH PROPOFOL;  Surgeon: Pasty Spillers, MD;  Location: [facility];  Service: Endoscopy;  Laterality: N/A;  priority 4   COMBINED AUGMENTATION MAMMAPLASTY AND ABDOMINOPLASTY     CORONARY ARTERY BYPASS GRAFT  08/2017   X2 with LIMA to LAD; 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;Severe 3 vessel CAD-PCI to LAD and LC x(5stents) Coronary dissection of LM to LAD    CORONARY ARTERY BYPASS GRAFT     CORONARY STENT PLACEMENT     PERCUTANEOUS CORONARY STENT INTERVENTION (PCI-S)     REDUCTION MAMMAPLASTY Bilateral 2017   TONSILLECTOMY      Short Social History:  Social History   Tobacco Use   Smoking status: Former    Packs/day: 1.00    Years: 33.00    Pack years: 33.00    Types: Cigarettes   Smokeless tobacco: Never  Substance Use Topics   Alcohol use: Yes    Comment: occasional    Allergies  Allergen Reactions   Ticagrelor Rash    Current Outpatient Medications  Medication Sig Dispense Refill   aspirin EC 81 MG tablet Take 81 mg by mouth daily.     carvedilol (COREG) 25 MG tablet Take 1 tablet (25 mg total) by mouth 2 (two) times daily. 180 tablet 1   clobetasol ointment (TEMOVATE) 0.05 % Apply  to affected area every night for 4 weeks, then every other day for 4 weeks and then twice a week for 4 weeks or until resolution. 30 g 5   dapagliflozin propanediol (FARXIGA) 10 MG TABS tablet Take 1 tablet (10 mg total) by mouth daily. 90 tablet 3   glimepiride (AMARYL) 2 MG tablet TAKE 1 TABLET BY MOUTH  DAILY WITH BREAKFAST 90 tablet 1   icosapent Ethyl (VASCEPA) 1 g capsule Take 2 capsules (2 g total) by mouth 2 (two) times daily. 360 capsule 3   metFORMIN (GLUCOPHAGE) 1000 MG tablet TAKE ONE-HALF TABLET BY  MOUTH TWICE DAILY WITH A  MEAL 90 tablet 1   Multiple Vitamin (MULTIVITAMIN) tablet Take 1 tablet by mouth daily.     oxybutynin  (DITROPAN-XL) 5 MG 24 hr tablet TAKE 1 TABLET(5 MG) BY MOUTH AT BEDTIME 90 tablet 3   rosuvastatin (CRESTOR) 40 MG tablet Take 1 tablet (40 mg total) by mouth daily at 6 PM. 90 tablet 3   sacubitril-valsartan (ENTRESTO) 97-103 MG Take 1 tablet by mouth 2 (two) times daily. 180 tablet 3   spironolactone (ALDACTONE) 25 MG tablet Take 1 tablet (25 mg total) by mouth daily. 90 tablet 3   No current facility-administered medications for this visit.    Review of Systems  Constitutional: Negative for chills, fatigue, fever and unexpected weight change.  HENT: Negative for trouble swallowing.  Eyes: Negative for loss of vision.  Respiratory: Negative for cough, shortness of breath and wheezing.  Cardiovascular: Negative for chest pain, leg swelling, palpitations and syncope.  GI: Negative for abdominal pain, blood in stool, diarrhea, nausea and vomiting.  GU: Negative for difficulty urinating, dysuria, frequency and hematuria.  Musculoskeletal: Negative for back pain, leg pain and joint pain.  Skin: Negative for rash.  Neurological: Negative for dizziness, headaches, light-headedness, numbness and seizures.  Psychiatric: Negative for behavioral problem, confusion, depressed mood and sleep disturbance.       Objective:  Objective   Vitals:   01/31/21 0910  BP: 100/70  Weight: 189 lb (85.7 kg)  Height: 5\' 3"  (1.6 m)   Body mass index is 33.48 kg/m.  Physical Exam Vitals and nursing note reviewed. Exam conducted with a chaperone present.  Constitutional:      Appearance: Normal appearance. She is well-developed.  HENT:     Head: Normocephalic and atraumatic.  Eyes:     Extraocular Movements: Extraocular movements intact.     Pupils: Pupils are equal, round, and reactive to light.  Cardiovascular:     Rate and Rhythm: Normal rate and regular rhythm.  Pulmonary:     Effort: Pulmonary effort is normal. No respiratory distress.     Breath sounds: Normal breath sounds.  Abdominal:      General: Abdomen is flat.     Palpations: Abdomen is soft.  Genitourinary:    Comments: External: Diffuse vulvar erythema  No lesions noted.   Musculoskeletal:        General: No signs of injury.  Skin:    General: Skin is warm and dry.  Neurological:     Mental Status: She is alert and oriented to person, place, and time.  Psychiatric:        Behavior: Behavior normal.        Thought Content: Thought content normal.        Judgment: Judgment normal.    Assessment/Plan:     65 yo with vulvar itching and urinary frequency.   Continued ertythema, no improvement in  skin appearance despite weekly clobetasol usage- some continue itching Check for yeast, trial topical terazol, nuswab sent.  Return for vulvar biopsy in 1 week Check thyroid labs today.   2. Less urgency. Happy with improvement in urinary frequency.    More than 20 minutes were spent face to face with the patient in the room, reviewing the medical record, labs and images, and coordinating care for the patient. The plan of management was discussed in detail and counseling was provided.     Adelene Idler MD [facility], [facility] 01/31/2021 9:43 AM

## 2021-01-31 NOTE — Patient Instructions (Signed)
Vulva Biopsy A vulva biopsy is a procedure in which a sample of tissue from the vulva is removed and examined under a microscope. The vulva is the outside part of the female genitals. The vulva includes the outside folds of skin (labia majora), the inner lips (labia minora), the clitoris, and the openings of the urethra and vagina. Your health care provider may order this procedure to diagnose a lesion, growth, rash, blister, or other tissue changes. This procedure may also be done to remove a mole or wart. Tell a health care provider about: Any allergies you have. All medicines you are taking, including vitamins, herbs, eye drops, creams, and over-the-counter medicines. Any problems you or family members have had with anesthetic medicines. Any blood disorders you have. Any surgeries you have had. Any medical conditions you have. Whether you are pregnant or may be pregnant. What are the risks? Generally, this is a safe procedure. However, problems may occur, including: Infection. Bleeding. Allergic reaction to medicines. Damage to other nearby structures or organs. Pain at the biopsy site. What happens before the procedure? Medicines Ask your health care provider about: Changing or stopping your regular medicines. This is especially important if you are taking diabetes medicines or blood thinners. Taking medicines such as aspirin and ibuprofen. These medicines can thin your blood. Do not take these medicines unless your health care provider tells you to take them. Taking over-the-counter medicines, vitamins, herbs, and supplements. General instructions Wear loose and comfortable clothing and underwear for the procedure. Follow instructions from your health care provider about eating or drinking restrictions. Ask your health care provider what steps will be taken to help prevent infection. These may include: Removing hair at the surgery site. Washing skin with a germ-killing soap. Taking  antibiotic medicine. What happens during the procedure? You will be given a medicine to numb the area (local anesthetic). A small sample of tissue will be removed (excised). This tissue will be sent to a lab for testing. A medicine may be put on the biopsy site to help stop the bleeding. The biopsy site may be closed with stitches (sutures). The procedure may vary among health care providers and hospitals. What happens after the procedure? You may be given pain medicine. You may be given antibiotic ointment. It is up to you to get the results of your procedure. Ask your health care provider, or the department that is doing the procedure, when your results will be ready. Summary A vulva biopsy is a procedure in which a sample of tissue from the vulva is removed and examined under a microscope. Your health care provider may order this procedure to diagnose a lesion, growth, rash, blister, or other tissue changes. Generally, this is a safe procedure. However, problems may occur, including infection, bleeding, or allergic reaction to medicines. Ask your health care provider, or the department that is doing the procedure, when your results will be ready. This information is not intended to replace advice given to you by your health care provider. Make sure you discuss any questions you have with your health care provider. Document Revised: 09/27/2017 Document Reviewed: 09/27/2017 Elsevier Patient Education  2022 Elsevier Inc. Vulva Biopsy, Care After This sheet gives you information about how to care for yourself after your procedure. Your health care provider may also give you more specific instructions. If you have problems or questions, contact your health care provider. What can I expect after the procedure? After the procedure, it is common to have: Slight bleeding from  the biopsy site. Discomfort at the biopsy site. Follow these instructions at home: Biopsy site care  Follow  instructions from your health care provider about how to take care of your biopsy site. Make sure you: Clean the area using water and mild soap twice a day or as told by your health care provider. Gently pat the area dry. If you were prescribed an antibiotic ointment, apply it as told by your health care provider. Do not stop using the antibiotic even if your condition improves. Take a warm water bath (sitz bath) as needed to help with pain and discomfort. A sitz bath is taken while you are sitting down. The water should only come up to your hips and should cover your buttocks. Leave stitches (sutures), skin glue, or adhesive strips in place. These skin closures may need to stay in place for 2 weeks or longer. If adhesive strip edges start to loosen and curl up, you may trim the loose edges. Do not remove adhesive strips completely unless your health care provider tells you to do that. Check your biopsy site every day for signs of infection. Check for: More redness, swelling, or pain. More fluid or blood. Warmth. Pus or a bad smell. Do not rub the biopsy area after urinating. Gently pat the area dry or use a bottle filled with warm water (peri-bottle) to clean the area. Gently wipe from front to back. Lifestyle Wear loose, cotton underwear. Do not wear tight pants. Do not use a tampon, douche, or put anything inside your vagina for at least 1 week or until your health care provider approves. Do not have sex for at least 1 week or until your health care provider approves. Do not exercise, such as running or biking, until your health care provider approves. Do not swim or use a hot tub until your health care provider approves. You may shower or take a sitz bath. General instructions Take over-the-counter and prescription medicines only as told by your health care provider. Use a sanitary napkin until the bleeding stops. Keep all follow-up visits as told by your health care provider. This is  important. Contact a health care provider if: You have more redness, swelling, or pain around your biopsy site. You have more fluid or blood coming from your biopsy site. Your biopsy site feels warm to the touch. Your pain is not controlled with medicine. Get help right away if you have: Heavy bleeding from the vulva. Pus or a bad smell coming from your biopsy site. A fever. Lower abdominal pain. Summary After the procedure, it is common to have slight bleeding and discomfort at the biopsy site. Follow instructions from your health care provider after your biopsy. Make sure you clean the area with water and mild soap. Pat the area dry. Take sitz baths as needed to help with pain and discomfort. Leave any sutures in place. Check your biopsy site for signs of infection, which may include more redness, swelling, pain, fluid, or blood, or feeling warm to the touch. Get help right away if you have heavy bleeding, a fever, pus or a bad smell, or pain in the lower abdomen. This information is not intended to replace advice given to you by your health care provider. Make sure you discuss any questions you have with your health care provider. Document Revised: 09/27/2017 Document Reviewed: 09/27/2017 Elsevier Patient Education  2022 [employer].

## 2021-02-01 LAB — TSH+FREE T4
Free T4: 1.31 ng/dL (ref 0.82–1.77)
TSH: 1.97 u[IU]/mL (ref 0.450–4.500)

## 2021-02-02 NOTE — Progress Notes (Signed)
Remote ICD transmission.   

## 2021-02-03 LAB — NUSWAB BV AND CANDIDA, NAA
Candida albicans, NAA: NEGATIVE
Candida glabrata, NAA: NEGATIVE

## 2021-02-08 ENCOUNTER — Other Ambulatory Visit: Payer: Self-pay

## 2021-02-08 ENCOUNTER — Ambulatory Visit (INDEPENDENT_AMBULATORY_CARE_PROVIDER_SITE_OTHER): Payer: Medicare Other | Admitting: Obstetrics and Gynecology

## 2021-02-08 ENCOUNTER — Encounter: Payer: Self-pay | Admitting: Obstetrics and Gynecology

## 2021-02-08 ENCOUNTER — Other Ambulatory Visit (HOSPITAL_COMMUNITY)
Admission: RE | Admit: 2021-02-08 | Discharge: 2021-02-08 | Disposition: A | Discharge status: Home / Self Care | Payer: Medicare Other | Source: Ambulatory Visit | Attending: Obstetrics and Gynecology | Admitting: Obstetrics and Gynecology

## 2021-02-08 VITALS — BP 108/70 | Ht 63.0 in | Wt 186.2 lb

## 2021-02-08 DIAGNOSIS — N904 Leukoplakia of vulva: Secondary | ICD-10-CM | POA: Diagnosis present

## 2021-02-08 NOTE — Progress Notes (Signed)
VULVAR BIOPSY NOTE The indications for vulvar biopsy (rule out neoplasia, establish lichen sclerosus diagnosis) were reviewed.   Risks of the biopsy including pain, bleeding, infection, inadequate specimen, scarring and need for additional procedures  were discussed. The patient stated understanding and agreed to undergo procedure today. Consent was signed,  time out performed.   The patient's vulva was prepped with Betadine. 1% lidocaine was injected into area of concern. A 5 -mm punch biopsy was done, biopsy tissue was picked up with sterile forceps and sterile scissors were used to excise the lesion.  Small bleeding was noted and hemostasis was achieved using silver nitrate sticks.  The patient tolerated the procedure well. Post-procedure instructions  (pelvic rest for one week) were given to the patient. The patient is to call with heavy bleeding, fever greater than 100.4, foul smelling vaginal discharge or other concerns.   Physical Exam Genitourinary:      Comments: Diffuse erythema in red outline area    Adelene Idler MD, [facility], [facility] 02/08/2021 10:47 AM

## 2021-02-11 LAB — SURGICAL PATHOLOGY

## 2021-02-16 ENCOUNTER — Encounter (HOSPITAL_COMMUNITY): Payer: Self-pay | Admitting: Cardiology

## 2021-02-16 ENCOUNTER — Ambulatory Visit (HOSPITAL_BASED_OUTPATIENT_CLINIC_OR_DEPARTMENT_OTHER)
Admission: RE | Admit: 2021-02-16 | Discharge: 2021-02-16 | Disposition: A | Discharge status: Home / Self Care | Payer: Medicare Other | Source: Ambulatory Visit | Attending: Cardiology | Admitting: Cardiology

## 2021-02-16 ENCOUNTER — Ambulatory Visit (HOSPITAL_COMMUNITY)
Admission: RE | Admit: 2021-02-16 | Discharge: 2021-02-16 | Disposition: A | Discharge status: Home / Self Care | Payer: Medicare Other | Source: Ambulatory Visit | Attending: Cardiology | Admitting: Cardiology

## 2021-02-16 ENCOUNTER — Other Ambulatory Visit: Payer: Self-pay

## 2021-02-16 VITALS — BP 102/70 | HR 76 | Wt 184.4 lb

## 2021-02-16 DIAGNOSIS — Z8249 Family history of ischemic heart disease and other diseases of the circulatory system: Secondary | ICD-10-CM | POA: Diagnosis not present

## 2021-02-16 DIAGNOSIS — Z7982 Long term (current) use of aspirin: Secondary | ICD-10-CM | POA: Insufficient documentation

## 2021-02-16 DIAGNOSIS — Z951 Presence of aortocoronary bypass graft: Secondary | ICD-10-CM | POA: Insufficient documentation

## 2021-02-16 DIAGNOSIS — I110 Hypertensive heart disease with heart failure: Secondary | ICD-10-CM | POA: Diagnosis not present

## 2021-02-16 DIAGNOSIS — Z79899 Other long term (current) drug therapy: Secondary | ICD-10-CM | POA: Diagnosis not present

## 2021-02-16 DIAGNOSIS — I5022 Chronic systolic (congestive) heart failure: Secondary | ICD-10-CM | POA: Insufficient documentation

## 2021-02-16 DIAGNOSIS — E785 Hyperlipidemia, unspecified: Secondary | ICD-10-CM | POA: Diagnosis not present

## 2021-02-16 DIAGNOSIS — I252 Old myocardial infarction: Secondary | ICD-10-CM | POA: Diagnosis not present

## 2021-02-16 DIAGNOSIS — I2510 Atherosclerotic heart disease of native coronary artery without angina pectoris: Secondary | ICD-10-CM | POA: Diagnosis not present

## 2021-02-16 DIAGNOSIS — I255 Ischemic cardiomyopathy: Secondary | ICD-10-CM | POA: Insufficient documentation

## 2021-02-16 DIAGNOSIS — Z95810 Presence of automatic (implantable) cardiac defibrillator: Secondary | ICD-10-CM | POA: Insufficient documentation

## 2021-02-16 LAB — ECHOCARDIOGRAM COMPLETE
Area-P 1/2: 4.4 cm2
Calc EF: 38 %
S' Lateral: 3.4 cm
Single Plane A2C EF: 38.6 %
Single Plane A4C EF: 37.5 %

## 2021-02-16 LAB — BASIC METABOLIC PANEL
Anion gap: 11 (ref 5–15)
BUN: 19 mg/dL (ref 8–23)
CO2: 23 mmol/L (ref 22–32)
Calcium: 9.9 mg/dL (ref 8.9–10.3)
Chloride: 103 mmol/L (ref 98–111)
Creatinine, Ser: 1.21 mg/dL — ABNORMAL HIGH (ref 0.44–1.00)
GFR, Estimated: 50 mL/min — ABNORMAL LOW (ref >60)
Glucose, Bld: 219 mg/dL — ABNORMAL HIGH (ref 70–99)
Potassium: 4.4 mmol/L (ref 3.5–5.1)
Sodium: 137 mmol/L (ref 135–145)

## 2021-02-16 NOTE — Patient Instructions (Addendum)
Labs done today. We will contact you only if your labs are abnormal.  No medication changes were made. Please continue all current medications as prescribed.  Your physician recommends that you schedule a follow-up appointment in: 3 months for a lab only appointment and in 6 months with Dr. Shirlee Latch. Please contact our office in April 2023 to schedule a May 2023 appointment.   If you have any questions or concerns before your next appointment please send [place] a message through [place] or call our office at [phone].    TO LEAVE A MESSAGE FOR THE NURSE SELECT OPTION 2, PLEASE LEAVE A MESSAGE INCLUDING: YOUR NAME DATE OF BIRTH CALL BACK NUMBER REASON FOR CALL**this is important as we prioritize the call backs  YOU WILL RECEIVE A CALL BACK THE SAME DAY AS LONG AS YOU CALL BEFORE 4:00 PM   Do the following things EVERYDAY: Weigh yourself in the morning before breakfast. Write it down and keep it in a log. Take your medicines as prescribed Eat low salt foods--Limit salt (sodium) to 2000 mg per day.  Stay as active as you can everyday Limit all fluids for the day to less than 2 liters   At the [facility], you and your health needs are our priority. As part of our continuing mission to provide you with exceptional heart care, we have created designated Provider Care Teams. These Care Teams include your primary Cardiologist (physician) and Advanced Practice Providers (APPs- Physician Assistants and Nurse Practitioners) who all work together to provide you with the care you need, when you need it.   You may see any of the following providers on your designated Care Team at your next follow up: Dr Arvilla Meres Dr Carron Curie, NP Robbie Lis, [place] Karle Plumber, PharmD   Please be sure to bring in all your medications bottles to every appointment.

## 2021-02-16 NOTE — Progress Notes (Signed)
PCP: Lorre Munroe, NP  Cardiology: Dr. Azucena Cecil HF Cardiology: Dr. Shirlee Latch  65 y.o. with CAD and ischemic cardiomyopathy was referred by Dr. Azucena Cecil for evaluation of CHF.  She lived in [place] in the past and had her initial cardiology care there.  In 4/19, she had PCI to LAD and LCx with 5 stents. She then had an MI in 5/19 and had CABG with LIMA-LAD and SVG-OM.  She developed an ischemic cardiomyopathy and had a St Jude ICD placed.  She moved to [facility] and has been followed there more recently.  Echo in 10/20 showed EF 30-35%, mild LV dilation, mild-moderate MR.  Echo in 10/21 showed EF 35-40%.   Echo was done today and reviewed, showing EF 35-40%, mildly decreased RV systolic function.   She returns for followup of CHF.  She is doing well symptomatically.  She does aerobics for 15 minutes and fatigues at the end of this time.  No dyspnea walking on flat ground.  No chest pain.  Weight down 6 lbs.   St Jude device interrogation: Stable thoracic impedance.   Labs (1/21): K 4.5, creatinine 1.09, LDL 54, TGs 196 Labs (4/21): K 4.2, creatinine 0.94 Labs (7/21): K 4.5, creatinine 0.96, LDL 56, HDl 57 Labs (10/21) K 4.5, creatinine 1.02 Labs (2/22): creatinine 1.09, LDL 59, TGs 301 Labs (10/22): K 4, creatinine 1.24, LDL 55, TGs 237  PMH: 1. HTN 2. Type 2 diabetes 3. CAD: PCI to LAD and LCx in 4/19, 5 stents altogether.  - MI in 5/19 with CABG (LIMA-LAD, SVG-OM).  4. Hyperlipidemia 5. Chronic systolic CHF: Ischemic cardiomyopathy.   - St Jude ICD.  - Echo (10/20): EF 30-35%, mildly dilated LV, normal RV, mild to moderate MR.  - Echo (10/21): EF 35-40%, diffuse hypokinesis, normal RV - Echo (11/22): EF 35-40%, mildly decreased RV systolic function. 6. Left femoral thrombendarterectomy with patch angioplasty in 9/19.  Complication of Impella.   FH: Mother with CABG in 26s, MI in 67s.   SH: Widowed, lives in [place], nonsmoker. She has children locally.  Not working.    ROS: All systems reviewed and negative except as per HPI.   Current Outpatient Medications  Medication Sig Dispense Refill   aspirin EC 81 MG tablet Take 81 mg by mouth daily.     carvedilol (COREG) 25 MG tablet Take 1 tablet (25 mg total) by mouth 2 (two) times daily. 180 tablet 1   clobetasol ointment (TEMOVATE) 0.05 % Apply to affected area every night for 4 weeks, then every other day for 4 weeks and then twice a week for 4 weeks or until resolution. (Patient taking differently: Apply to affected area every night for 4 weeks, then every other day for 4 weeks and then twice a week for 4 weeks or until resolution as needed.) 30 g 5   dapagliflozin propanediol (FARXIGA) 10 MG TABS tablet Take 1 tablet (10 mg total) by mouth daily. 90 tablet 3   glimepiride (AMARYL) 2 MG tablet TAKE 1 TABLET BY MOUTH  DAILY WITH BREAKFAST 90 tablet 1   icosapent Ethyl (VASCEPA) 1 g capsule Take 2 capsules (2 g total) by mouth 2 (two) times daily. 360 capsule 3   metFORMIN (GLUCOPHAGE) 1000 MG tablet TAKE ONE-HALF TABLET BY  MOUTH TWICE DAILY WITH A  MEAL 90 tablet 1   Multiple Vitamin (MULTIVITAMIN) tablet Take 1 tablet by mouth daily.     oxybutynin (DITROPAN-XL) 5 MG 24 hr tablet TAKE 1 TABLET(5 MG) BY MOUTH AT  BEDTIME 90 tablet 3   rosuvastatin (CRESTOR) 40 MG tablet Take 1 tablet (40 mg total) by mouth daily at 6 PM. 90 tablet 3   sacubitril-valsartan (ENTRESTO) 97-103 MG Take 1 tablet by mouth 2 (two) times daily. 180 tablet 3   spironolactone (ALDACTONE) 25 MG tablet Take 1 tablet (25 mg total) by mouth daily. 90 tablet 3   terconazole (TERAZOL 7) 0.4 % vaginal cream Place 1 applicator vaginally at bedtime. (Patient taking differently: Place 1 applicator vaginally at bedtime. As needed) 45 g 0   No current facility-administered medications for this encounter.   BP 102/70   Pulse 76   Wt 83.6 kg (184 lb 6.4 oz)   SpO2 98%   BMI 32.66 kg/m  General: NAD Neck: No JVD, no thyromegaly or thyroid  nodule.  Lungs: Clear to auscultation bilaterally with normal respiratory effort. CV: Nondisplaced PMI.  Heart regular S1/S2, no S3/S4, no murmur.  No peripheral edema.  No carotid bruit.  Normal pedal pulses.  Abdomen: Soft, nontender, no hepatosplenomegaly, no distention.  Skin: Intact without lesions or rashes.  Neurologic: Alert and oriented x 3.  Psych: Normal affect. Extremities: No clubbing or cyanosis.  HEENT: Normal.   Assessment/Plan: 1. Chronic systolic CHF: Ischemic cardiomyopathy.  St Jude ICD.  Echo (10/20) with EF 30-35%, mild-moderate MR.  Echo in 10/21 and again in 11/22 with EF 35-40%.   She is not volume overloaded by exam.  NYHA class II symptoms. She is on goal doses of HF meds.  - Continue spironolactone 25 mg daily.  BMET today.  - Continue Coreg 25 mg bid. - Continue Entresto 97/103 bid    - Has not needed Lasix.  - Continue dapagliflozin 10 mg daily.  2. CAD: s/p CABG, no chest pain.  - Continue ASA 81 daily.  - Continue Crestor 40 daily, good LDL in 2/22.  3. Hyperlipidemia: She is on Crestor and Vascepa, lipids ok in 10/22.   BMET 3 months, see me in 6 months.   Marca Ancona 02/16/2021

## 2021-02-17 ENCOUNTER — Ambulatory Visit (INDEPENDENT_AMBULATORY_CARE_PROVIDER_SITE_OTHER): Payer: Medicare Other | Admitting: Obstetrics and Gynecology

## 2021-02-17 ENCOUNTER — Encounter: Payer: Self-pay | Admitting: Obstetrics and Gynecology

## 2021-02-17 VITALS — BP 128/70 | Ht 63.0 in | Wt 185.8 lb

## 2021-02-17 DIAGNOSIS — B3731 Acute candidiasis of vulva and vagina: Secondary | ICD-10-CM

## 2021-02-17 DIAGNOSIS — L089 Local infection of the skin and subcutaneous tissue, unspecified: Secondary | ICD-10-CM | POA: Diagnosis not present

## 2021-02-17 DIAGNOSIS — T148XXA Other injury of unspecified body region, initial encounter: Secondary | ICD-10-CM

## 2021-02-17 MED ORDER — AMOXICILLIN-POT CLAVULANATE 875-125 MG PO TABS: 1.0000 | ORAL_TABLET | Freq: Two times a day (BID) | ORAL | 0 refills | Status: DC

## 2021-02-17 MED ORDER — FLUCONAZOLE 150 MG PO TABS: 150.0000 mg | ORAL_TABLET | ORAL | 0 refills | Status: AC

## 2021-02-17 NOTE — Patient Instructions (Signed)
Apply Betadine to vulvar area twice a day and let dry. He can also use Betadine after bowel movements.

## 2021-02-17 NOTE — Progress Notes (Signed)
Patient ID: Monique Franklin, female   DOB: [birth year], 65 y.o.   MRN: [id number]  Reason for Consult: Follow-up   Referred by Jearld Fenton, NP  Subjective:     HPI:  Monique Franklin is a 65 y.o. female she is she is following up today after vulvar biopsy.  Vulvar biopsy confirmed lichen sclerosis.  She reports that she has had less itching this past week.  She notes a stringy substance coming from the vulvar biopsy site.  She is not able to take baths at home but has been washing her vulva twice a day.  Gynecological History  No LMP recorded. Patient is postmenopausal. Past Medical History:  Diagnosis Date   Cardiomyopathy ([place])    CHF (congestive heart failure) (H[facility])    Class I   Coronary artery disease    Diabetes mellitus without complication (H[facility])    GERD (gastroesophageal reflux disease)    HFrEF (heart failure with reduced ejection fraction) ([place])    Hyperlipidemia    Hypertension    MI (myocardial infarction) ([place])    PAD (peripheral artery disease) ([place])    Family History  Problem Relation Age of Onset   Heart attack Mother    COPD Father    Thyroid cancer Sister    Breast cancer Sister 87   Diabetes Sister    Diabetes Brother    Past Surgical History:  Procedure Laterality Date   BREAST BIOPSY Bilateral 2016   neg   CARDIAC CATHETERIZATION     CARDIAC DEFIBRILLATOR PLACEMENT     CATARACT EXTRACTION Bilateral    CATARACT EXTRACTION W/PHACO Right 11/09/2020   Procedure: CATARACT EXTRACTION PHACO AND INTRAOCULAR LENS PLACEMENT ([place]) RIGHT VIVITY  DIABETIC;  Surgeon: Birder Robson, MD;  Location: [place];  Service: Ophthalmology;  Laterality: Right;  2.85 00:33.2   CATARACT EXTRACTION W/PHACO Left 11/30/2020   Procedure: CATARACT EXTRACTION PHACO AND INTRAOCULAR LENS PLACEMENT ([facility]) LEFT DIABETIC VIVITY 3.86 00:28.3;  Surgeon: Birder Robson, MD;  Location: [place];  Service: Ophthalmology;  Laterality: Left;   COLONOSCOPY WITH  PROPOFOL N/A 10/31/2019   Procedure: COLONOSCOPY WITH PROPOFOL;  Surgeon: Virgel Manifold, MD;  Location: [facility];  Service: Endoscopy;  Laterality: N/A;  priority 4   COMBINED AUGMENTATION MAMMAPLASTY AND ABDOMINOPLASTY     CORONARY ARTERY BYPASS GRAFT  08/2017   X2 with LIMA to LAD; 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;Severe 3 vessel CAD-PCI to LAD and LC x(5stents) Coronary dissection of LM to LAD    CORONARY ARTERY BYPASS GRAFT     CORONARY STENT PLACEMENT     PERCUTANEOUS CORONARY STENT INTERVENTION (PCI-S)     REDUCTION MAMMAPLASTY Bilateral 2017   TONSILLECTOMY      Short Social History:  Social History   Tobacco Use   Smoking status: Former    Packs/day: 1.00    Years: 33.00    Pack years: 33.00    Types: Cigarettes   Smokeless tobacco: Never  Substance Use Topics   Alcohol use: Yes    Comment: occasional    Allergies  Allergen Reactions   Ticagrelor Rash    Current Outpatient Medications  Medication Sig Dispense Refill   amoxicillin-clavulanate (AUGMENTIN) 875-125 MG tablet Take 1 tablet by mouth 2 (two) times daily for 10 days. 20 tablet 0   aspirin EC 81 MG tablet Take 81 mg by mouth daily.     carvedilol (COREG) 25 MG tablet Take 1 tablet (25 mg total) by mouth 2 (two) times daily. 180 tablet 1  clobetasol ointment (TEMOVATE) 0.05 % Apply to affected area every night for 4 weeks, then every other day for 4 weeks and then twice a week for 4 weeks or until resolution. (Patient taking differently: Apply to affected area every night for 4 weeks, then every other day for 4 weeks and then twice a week for 4 weeks or until resolution as needed.) 30 g 5   dapagliflozin propanediol (FARXIGA) 10 MG TABS tablet Take 1 tablet (10 mg total) by mouth daily. 90 tablet 3   glimepiride (AMARYL) 2 MG tablet TAKE 1 TABLET BY MOUTH  DAILY WITH BREAKFAST 90 tablet 1   icosapent Ethyl (VASCEPA) 1 g capsule Take 2 capsules (2 g total) by mouth 2 (two) times daily. 360 capsule 3    metFORMIN (GLUCOPHAGE) 1000 MG tablet TAKE ONE-HALF TABLET BY  MOUTH TWICE DAILY WITH A  MEAL 90 tablet 1   Multiple Vitamin (MULTIVITAMIN) tablet Take 1 tablet by mouth daily.     oxybutynin (DITROPAN-XL) 5 MG 24 hr tablet TAKE 1 TABLET(5 MG) BY MOUTH AT BEDTIME 90 tablet 3   rosuvastatin (CRESTOR) 40 MG tablet Take 1 tablet (40 mg total) by mouth daily at 6 PM. 90 tablet 3   sacubitril-valsartan (ENTRESTO) 97-103 MG Take 1 tablet by mouth 2 (two) times daily. 180 tablet 3   spironolactone (ALDACTONE) 25 MG tablet Take 1 tablet (25 mg total) by mouth daily. 90 tablet 3   terconazole (TERAZOL 7) 0.4 % vaginal cream Place 1 applicator vaginally at bedtime. (Patient taking differently: Place 1 applicator vaginally at bedtime. As needed) 45 g 0   No current facility-administered medications for this visit.    Review of Systems  Constitutional: Negative for chills, fatigue, fever and unexpected weight change.  HENT: Negative for trouble swallowing.  Eyes: Negative for loss of vision.  Respiratory: Negative for cough, shortness of breath and wheezing.  Cardiovascular: Negative for chest pain, leg swelling, palpitations and syncope.  GI: Negative for abdominal pain, blood in stool, diarrhea, nausea and vomiting.  GU: Negative for difficulty urinating, dysuria, frequency and hematuria.  Musculoskeletal: Negative for back pain, leg pain and joint pain.  Skin: Negative for rash.  Neurological: Negative for dizziness, headaches, light-headedness, numbness and seizures.  Psychiatric: Negative for behavioral problem, confusion, depressed mood and sleep disturbance.       Objective:  Objective   Vitals:   02/17/21 1140  BP: 128/70  Weight: 185 lb 12.8 oz (84.3 kg)  Height: 5\' 3"  (1.6 m)   Body mass index is 32.91 kg/m.  Physical Exam Vitals and nursing note reviewed. Exam conducted with a chaperone present.  Constitutional:      Appearance: Normal appearance. She is well-developed.  HENT:      Head: Normocephalic and atraumatic.  Eyes:     Extraocular Movements: Extraocular movements intact.     Pupils: Pupils are equal, round, and reactive to light.  Cardiovascular:     Rate and Rhythm: Normal rate and regular rhythm.  Pulmonary:     Effort: Pulmonary effort is normal. No respiratory distress.     Breath sounds: Normal breath sounds.  Abdominal:     General: Abdomen is flat.     Palpations: Abdomen is soft.  Genitourinary:    Comments: External: Diminished erythema suspect visit.  Vulvar biopsy site has erythematous base.  Biopsy site was cleaned with Betadine. Musculoskeletal:        General: No signs of injury.  Skin:    General: Skin is  warm and dry.  Neurological:     Mental Status: She is alert and oriented to person, place, and time.  Psychiatric:        Behavior: Behavior normal.        Thought Content: Thought content normal.        Judgment: Judgment normal.    Assessment/Plan:    65 year old with vulvar biopsy site wound infection Encouraged regular cleaning and use of Betadine at home twice a day. Will start on Augmentin.  Encourage patient to follow-up if she has any worsening symptoms or fever. We will follow-up with patient in 1 week.  More than 20 minutes were spent face to face with the patient in the room, reviewing the medical record, labs and images, and coordinating care for the patient. The plan of management was discussed in detail and counseling was provided.     Adrian Prows MD [facility], [facility] 02/17/2021 12:30 PM

## 2021-02-23 ENCOUNTER — Other Ambulatory Visit: Payer: Self-pay

## 2021-02-23 ENCOUNTER — Encounter: Payer: Self-pay | Admitting: Obstetrics and Gynecology

## 2021-02-23 ENCOUNTER — Ambulatory Visit (INDEPENDENT_AMBULATORY_CARE_PROVIDER_SITE_OTHER): Payer: Medicare Other | Admitting: Obstetrics and Gynecology

## 2021-02-23 VITALS — BP 116/72 | Ht 63.0 in | Wt 186.0 lb

## 2021-02-23 DIAGNOSIS — L089 Local infection of the skin and subcutaneous tissue, unspecified: Secondary | ICD-10-CM

## 2021-02-23 DIAGNOSIS — T148XXA Other injury of unspecified body region, initial encounter: Secondary | ICD-10-CM

## 2021-02-23 MED ORDER — AMOXICILLIN-POT CLAVULANATE 875-125 MG PO TABS: 1.0000 | ORAL_TABLET | Freq: Two times a day (BID) | ORAL | 0 refills | Status: AC

## 2021-02-23 NOTE — Progress Notes (Signed)
Patient ID: Monique Franklin, female   DOB: 1955/08/16, 65 y.o.   MRN: [id number]  Reason for Consult: Routine Prenatal Visit   Referred by Lorre Munroe, NP  Subjective:     HPI:  Monique Franklin is a 65 y.o. female she is following up for monitoring of her vulvar biopsy.  She reports that she is having less pain and discharge.  She is able to take her oral antibiotic.  She has been using Betadine after she has bowel movements to keep the biopsy site clean.  She is not able to use a bathtub to soak her bottom.  Gynecological History  No LMP recorded. Patient is postmenopausal. Past Medical History:  Diagnosis Date   Cardiomyopathy (HCC)    CHF (congestive heart failure) (HCC)    Class I   Coronary artery disease    Diabetes mellitus without complication (HCC)    GERD (gastroesophageal reflux disease)    HFrEF (heart failure with reduced ejection fraction) (HCC)    Hyperlipidemia    Hypertension    MI (myocardial infarction) (H[facility])    PAD (peripheral artery disease) (H[facility])    Family History  Problem Relation Age of Onset   Heart attack Mother    COPD Father    Thyroid cancer Sister    Breast cancer Sister 74   Diabetes Sister    Diabetes Brother    Past Surgical History:  Procedure Laterality Date   BREAST BIOPSY Bilateral 2016   neg   CARDIAC CATHETERIZATION     CARDIAC DEFIBRILLATOR PLACEMENT     CATARACT EXTRACTION Bilateral    CATARACT EXTRACTION W/PHACO Right 11/09/2020   Procedure: CATARACT EXTRACTION PHACO AND INTRAOCULAR LENS PLACEMENT ([facility]) RIGHT VIVITY  DIABETIC;  Surgeon: Galen Manila, MD;  Location: [facility];  Service: Ophthalmology;  Laterality: Right;  2.85 00:33.2   CATARACT EXTRACTION W/PHACO Left 11/30/2020   Procedure: CATARACT EXTRACTION PHACO AND INTRAOCULAR LENS PLACEMENT ([facility]) LEFT DIABETIC VIVITY 3.86 00:28.3;  Surgeon: Galen Manila, MD;  Location: [facility];  Service: Ophthalmology;  Laterality: Left;   COLONOSCOPY  WITH PROPOFOL N/A 10/31/2019   Procedure: COLONOSCOPY WITH PROPOFOL;  Surgeon: Pasty Spillers, MD;  Location: [facility];  Service: Endoscopy;  Laterality: N/A;  priority 4   COMBINED AUGMENTATION MAMMAPLASTY AND ABDOMINOPLASTY     CORONARY ARTERY BYPASS GRAFT  08/2017   X2 with LIMA to LAD; 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;Severe 3 vessel CAD-PCI to LAD and LC x(5stents) Coronary dissection of LM to LAD    CORONARY ARTERY BYPASS GRAFT     CORONARY STENT PLACEMENT     PERCUTANEOUS CORONARY STENT INTERVENTION (PCI-S)     REDUCTION MAMMAPLASTY Bilateral 2017   TONSILLECTOMY      Short Social History:  Social History   Tobacco Use   Smoking status: Former    Packs/day: 1.00    Years: 33.00    Pack years: 33.00    Types: Cigarettes   Smokeless tobacco: Never  Substance Use Topics   Alcohol use: Yes    Comment: occasional    Allergies  Allergen Reactions   Ticagrelor Rash    Current Outpatient Medications  Medication Sig Dispense Refill   amoxicillin-clavulanate (AUGMENTIN) 875-125 MG tablet Take 1 tablet by mouth 2 (two) times daily for 10 days. 20 tablet 0   aspirin EC 81 MG tablet Take 81 mg by mouth daily.     carvedilol (COREG) 25 MG tablet Take 1 tablet (25 mg total) by mouth 2 (two) times daily.  180 tablet 1   clobetasol ointment (TEMOVATE) 0.05 % Apply to affected area every night for 4 weeks, then every other day for 4 weeks and then twice a week for 4 weeks or until resolution. (Patient taking differently: Apply to affected area every night for 4 weeks, then every other day for 4 weeks and then twice a week for 4 weeks or until resolution as needed.) 30 g 5   dapagliflozin propanediol (FARXIGA) 10 MG TABS tablet Take 1 tablet (10 mg total) by mouth daily. 90 tablet 3   glimepiride (AMARYL) 2 MG tablet TAKE 1 TABLET BY MOUTH  DAILY WITH BREAKFAST 90 tablet 1   icosapent Ethyl (VASCEPA) 1 g capsule Take 2 capsules (2 g total) by mouth 2 (two) times daily. 360 capsule 3    metFORMIN (GLUCOPHAGE) 1000 MG tablet TAKE ONE-HALF TABLET BY  MOUTH TWICE DAILY WITH A  MEAL 90 tablet 1   Multiple Vitamin (MULTIVITAMIN) tablet Take 1 tablet by mouth daily.     oxybutynin (DITROPAN-XL) 5 MG 24 hr tablet TAKE 1 TABLET(5 MG) BY MOUTH AT BEDTIME 90 tablet 3   rosuvastatin (CRESTOR) 40 MG tablet Take 1 tablet (40 mg total) by mouth daily at 6 PM. 90 tablet 3   sacubitril-valsartan (ENTRESTO) 97-103 MG Take 1 tablet by mouth 2 (two) times daily. 180 tablet 3   spironolactone (ALDACTONE) 25 MG tablet Take 1 tablet (25 mg total) by mouth daily. 90 tablet 3   terconazole (TERAZOL 7) 0.4 % vaginal cream Place 1 applicator vaginally at bedtime. (Patient taking differently: Place 1 applicator vaginally at bedtime. As needed) 45 g 0   No current facility-administered medications for this visit.    Review of Systems  Constitutional: Negative for chills, fatigue, fever and unexpected weight change.  HENT: Negative for trouble swallowing.  Eyes: Negative for loss of vision.  Respiratory: Negative for cough, shortness of breath and wheezing.  Cardiovascular: Negative for chest pain, leg swelling, palpitations and syncope.  GI: Negative for abdominal pain, blood in stool, diarrhea, nausea and vomiting.  GU: Negative for difficulty urinating, dysuria, frequency and hematuria.  Musculoskeletal: Negative for back pain, leg pain and joint pain.  Skin: Negative for rash.  Neurological: Negative for dizziness, headaches, light-headedness, numbness and seizures.  Psychiatric: Negative for behavioral problem, confusion, depressed mood and sleep disturbance.       Objective:  Objective   Vitals:   02/23/21 1007  BP: 116/72  Weight: 186 lb (84.4 kg)  Height: 5\' 3"  (1.6 m)   Body mass index is 32.95 kg/m.  Physical Exam Genitourinary:     Assessment/Plan:     65 year old status post vulvar biopsy with wound infection.  Continue Augmentin.  Prescription for 10 additional days  sent to her pharmacy.  Reviewed cleaning with Betadine.  Discussed that she can apply Neosporin after cleaning the lesion twice a day.  Patient's lichen sclerosus is improving with continue clobetasol ointment.  Patient reports that she is having significantly less  itching.   More than 5 minutes were spent face to face with the patient in the room, reviewing the medical record, labs and images, and coordinating care for the patient. The plan of management was discussed in detail and counseling was provided.      Adrian Prows MD [facility], [facility] 02/23/2021 1:54 PM

## 2021-03-08 ENCOUNTER — Other Ambulatory Visit (HOSPITAL_COMMUNITY): Payer: Self-pay | Admitting: *Deleted

## 2021-03-08 ENCOUNTER — Encounter (HOSPITAL_COMMUNITY): Payer: Self-pay

## 2021-03-08 ENCOUNTER — Encounter: Payer: Self-pay | Admitting: Internal Medicine

## 2021-03-08 MED ORDER — SPIRONOLACTONE 25 MG PO TABS: 25.0000 mg | ORAL_TABLET | Freq: Every day | ORAL | 3 refills | Status: DC

## 2021-03-08 MED ORDER — FREESTYLE LIBRE 14 DAY SENSOR MISC: 1.0000 | 2 refills | Status: DC

## 2021-03-08 MED ORDER — ICOSAPENT ETHYL 1 G PO CAPS: 2.0000 g | ORAL_CAPSULE | Freq: Two times a day (BID) | ORAL | 3 refills | Status: DC

## 2021-03-08 MED ORDER — CARVEDILOL 25 MG PO TABS: 25.0000 mg | ORAL_TABLET | Freq: Two times a day (BID) | ORAL | 1 refills | Status: DC

## 2021-03-08 MED ORDER — FREESTYLE LIBRE 14 DAY READER DEVI: 1.0000 | 0 refills | Status: DC | PRN

## 2021-03-17 ENCOUNTER — Ambulatory Visit (INDEPENDENT_AMBULATORY_CARE_PROVIDER_SITE_OTHER): Payer: Medicare Other | Admitting: Obstetrics and Gynecology

## 2021-03-17 ENCOUNTER — Encounter: Payer: Self-pay | Admitting: Obstetrics and Gynecology

## 2021-03-17 ENCOUNTER — Other Ambulatory Visit: Payer: Self-pay

## 2021-03-17 VITALS — BP 116/72 | Ht 63.0 in | Wt 182.6 lb

## 2021-03-17 DIAGNOSIS — L900 Lichen sclerosus et atrophicus: Secondary | ICD-10-CM

## 2021-03-17 NOTE — Patient Instructions (Signed)
Continue clobetasol application nightly. Follow up in 4 weeks

## 2021-03-17 NOTE — Progress Notes (Signed)
Patient ID: Monique Franklin, female   DOB: June 14, 1955, 65 y.o.   MRN: [id number]  Reason for Consult: Follow-up   Referred by Jearld Fenton, NP  Subjective:     HPI:  Monique Franklin is a 64 y.o. female. She reports that her itching improved so she had been using less clobetasol. She is not using it nightly anymore and rather PRN.   Gynecological History  No LMP recorded. Patient is postmenopausal.   Past Medical History:  Diagnosis Date   Cardiomyopathy ([place])    CHF (congestive heart failure) (HCC)    Class I   Coronary artery disease    Diabetes mellitus without complication (HCC)    GERD (gastroesophageal reflux disease)    HFrEF (heart failure with reduced ejection fraction) ([place])    Hyperlipidemia    Hypertension    MI (myocardial infarction) ([place])    PAD (peripheral artery disease) ([place])    Family History  Problem Relation Age of Onset   Heart attack Mother    COPD Father    Thyroid cancer Sister    Breast cancer Sister 3   Diabetes Sister    Diabetes Brother    Past Surgical History:  Procedure Laterality Date   BREAST BIOPSY Bilateral 2016   neg   CARDIAC CATHETERIZATION     CARDIAC DEFIBRILLATOR PLACEMENT     CATARACT EXTRACTION Bilateral    CATARACT EXTRACTION W/PHACO Right 11/09/2020   Procedure: CATARACT EXTRACTION PHACO AND INTRAOCULAR LENS PLACEMENT ([place]) RIGHT VIVITY  DIABETIC;  Surgeon: Birder Robson, MD;  Location: [place];  Service: Ophthalmology;  Laterality: Right;  2.85 00:33.2   CATARACT EXTRACTION W/PHACO Left 11/30/2020   Procedure: CATARACT EXTRACTION PHACO AND INTRAOCULAR LENS PLACEMENT ([facility]) LEFT DIABETIC VIVITY 3.86 00:28.3;  Surgeon: Birder Robson, MD;  Location: [place];  Service: Ophthalmology;  Laterality: Left;   COLONOSCOPY WITH PROPOFOL N/A 10/31/2019   Procedure: COLONOSCOPY WITH PROPOFOL;  Surgeon: Virgel Manifold, MD;  Location: [facility];  Service: Endoscopy;  Laterality: N/A;   priority 4   COMBINED AUGMENTATION MAMMAPLASTY AND ABDOMINOPLASTY     CORONARY ARTERY BYPASS GRAFT  08/2017   X2 with LIMA to LAD; 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;Severe 3 vessel CAD-PCI to LAD and LC x(5stents) Coronary dissection of LM to LAD    CORONARY ARTERY BYPASS GRAFT     CORONARY STENT PLACEMENT     PERCUTANEOUS CORONARY STENT INTERVENTION (PCI-S)     REDUCTION MAMMAPLASTY Bilateral 2017   TONSILLECTOMY      Short Social History:  Social History   Tobacco Use   Smoking status: Former    Packs/day: 1.00    Years: 33.00    Pack years: 33.00    Types: Cigarettes   Smokeless tobacco: Never  Substance Use Topics   Alcohol use: Yes    Comment: occasional    Allergies  Allergen Reactions   Ticagrelor Rash    Current Outpatient Medications  Medication Sig Dispense Refill   aspirin EC 81 MG tablet Take 81 mg by mouth daily.     carvedilol (COREG) 25 MG tablet Take 1 tablet (25 mg total) by mouth 2 (two) times daily. 180 tablet 1   clobetasol ointment (TEMOVATE) 0.05 % Apply to affected area every night for 4 weeks, then every other day for 4 weeks and then twice a week for 4 weeks or until resolution. (Patient taking differently: Apply to affected area every night for 4 weeks, then every other day for 4 weeks and then  twice a week for 4 weeks or until resolution as needed.) 30 g 5   dapagliflozin propanediol (FARXIGA) 10 MG TABS tablet Take 1 tablet (10 mg total) by mouth daily. 90 tablet 3   glimepiride (AMARYL) 2 MG tablet TAKE 1 TABLET BY MOUTH  DAILY WITH BREAKFAST 90 tablet 1   icosapent Ethyl (VASCEPA) 1 g capsule Take 2 capsules (2 g total) by mouth 2 (two) times daily. 360 capsule 3   metFORMIN (GLUCOPHAGE) 1000 MG tablet TAKE ONE-HALF TABLET BY  MOUTH TWICE DAILY WITH A  MEAL 90 tablet 1   Multiple Vitamin (MULTIVITAMIN) tablet Take 1 tablet by mouth daily.     oxybutynin (DITROPAN-XL) 5 MG 24 hr tablet TAKE 1 TABLET(5 MG) BY MOUTH AT BEDTIME 90 tablet 3   rosuvastatin  (CRESTOR) 40 MG tablet Take 1 tablet (40 mg total) by mouth daily at 6 PM. 90 tablet 3   sacubitril-valsartan (ENTRESTO) 97-103 MG Take 1 tablet by mouth 2 (two) times daily. 180 tablet 3   spironolactone (ALDACTONE) 25 MG tablet Take 1 tablet (25 mg total) by mouth daily. 90 tablet 3   terconazole (TERAZOL 7) 0.4 % vaginal cream Place 1 applicator vaginally at bedtime. (Patient taking differently: Place 1 applicator vaginally at bedtime. As needed) 45 g 0   Continuous Blood Gluc Receiver (FREESTYLE LIBRE 14 DAY READER) DEVI 1 Device by Does not apply route as needed. 1 each 0   Continuous Blood Gluc Sensor (FREESTYLE LIBRE 14 DAY SENSOR) MISC 1 Device by Does not apply route every 14 (fourteen) days. 6 each 2   No current facility-administered medications for this visit.    Review of Systems  Constitutional: Negative for chills, fatigue, fever and unexpected weight change.  HENT: Negative for trouble swallowing.  Eyes: Negative for loss of vision.  Respiratory: Negative for cough, shortness of breath and wheezing.  Cardiovascular: Negative for chest pain, leg swelling, palpitations and syncope.  GI: Negative for abdominal pain, blood in stool, diarrhea, nausea and vomiting.  GU: Negative for difficulty urinating, dysuria, frequency and hematuria.  Musculoskeletal: Negative for back pain, leg pain and joint pain.  Skin: Negative for rash.  Neurological: Negative for dizziness, headaches, light-headedness, numbness and seizures.  Psychiatric: Negative for behavioral problem, confusion, depressed mood and sleep disturbance.       Objective:  Objective   Vitals:   03/17/21 1403  BP: 116/72  Weight: 182 lb 9.6 oz (82.8 kg)  Height: 5\' 3"  (1.6 m)   Body mass index is 32.35 kg/m.  Physical Exam Vitals and nursing note reviewed. Exam conducted with a chaperone present.  Constitutional:      Appearance: Normal appearance. She is well-developed.  HENT:     Head: Normocephalic and  atraumatic.  Eyes:     Extraocular Movements: Extraocular movements intact.     Pupils: Pupils are equal, round, and reactive to light.  Cardiovascular:     Rate and Rhythm: Normal rate and regular rhythm.  Pulmonary:     Effort: Pulmonary effort is normal. No respiratory distress.     Breath sounds: Normal breath sounds.  Abdominal:     General: Abdomen is flat.     Palpations: Abdomen is soft.  Genitourinary:      Comments: Mild erythema, well healed biopsy site.  Musculoskeletal:        General: No signs of injury.  Skin:    General: Skin is warm and dry.  Neurological:     Mental Status: She is  alert and oriented to person, place, and time.  Psychiatric:        Behavior: Behavior normal.        Thought Content: Thought content normal.        Judgment: Judgment normal.    Assessment/Plan:     65 yo with lichen sclerosis Reviewed with patient the importance of daily application until resolution of symptoms and then transitioning to maintenance therapy.   More than 10 minutes were spent face to face with the patient in the room, reviewing the medical record, labs and images, and coordinating care for the patient. The plan of management was discussed in detail and counseling was provided.    Adelene Idler MD [facility], [facility] 03/17/2021 2:31 PM

## 2021-04-14 ENCOUNTER — Ambulatory Visit: Payer: Medicare Other | Admitting: Obstetrics and Gynecology

## 2021-04-18 ENCOUNTER — Encounter: Payer: Self-pay | Admitting: Obstetrics and Gynecology

## 2021-04-18 ENCOUNTER — Other Ambulatory Visit: Payer: Self-pay

## 2021-04-18 ENCOUNTER — Ambulatory Visit (INDEPENDENT_AMBULATORY_CARE_PROVIDER_SITE_OTHER): Payer: Medicare Other | Admitting: Obstetrics and Gynecology

## 2021-04-18 VITALS — BP 120/70 | Ht 63.0 in | Wt 180.8 lb

## 2021-04-18 DIAGNOSIS — L900 Lichen sclerosus et atrophicus: Secondary | ICD-10-CM

## 2021-04-18 NOTE — Progress Notes (Signed)
Patient ID: Monique Franklin, female   DOB: Jan 09, 1956, 66 y.o.   MRN: [id number]  Reason for Consult: Follow-up   Referred by Jearld Fenton, NP  Subjective:     HPI:  [name] is a 66 y.o. female she is following up regarding lichen sclerosus.  She has been applying clobetasol ointment nightly.  She reports that her bottom feels better with significantly less itching.  Gynecological History  No LMP recorded. Patient is postmenopausal.  Past Medical History:  Diagnosis Date   Cardiomyopathy ([place])    CHF (congestive heart failure) (HCC)    Class I   Coronary artery disease    Diabetes mellitus without complication (HCC)    GERD (gastroesophageal reflux disease)    HFrEF (heart failure with reduced ejection fraction) ([place])    Hyperlipidemia    Hypertension    MI (myocardial infarction) ([place])    PAD (peripheral artery disease) ([place])    Family History  Problem Relation Age of Onset   Heart attack Mother    COPD Father    Thyroid cancer Sister    Breast cancer Sister 53   Diabetes Sister    Diabetes Brother    Past Surgical History:  Procedure Laterality Date   BREAST BIOPSY Bilateral 2016   neg   CARDIAC CATHETERIZATION     CARDIAC DEFIBRILLATOR PLACEMENT     CATARACT EXTRACTION Bilateral    CATARACT EXTRACTION W/PHACO Right 11/09/2020   Procedure: CATARACT EXTRACTION PHACO AND INTRAOCULAR LENS PLACEMENT ([place]) RIGHT VIVITY  DIABETIC;  Surgeon: Birder Robson, MD;  Location: [place];  Service: Ophthalmology;  Laterality: Right;  2.85 00:33.2   CATARACT EXTRACTION W/PHACO Left 11/30/2020   Procedure: CATARACT EXTRACTION PHACO AND INTRAOCULAR LENS PLACEMENT ([facility]) LEFT DIABETIC VIVITY 3.86 00:28.3;  Surgeon: Birder Robson, MD;  Location: [place];  Service: Ophthalmology;  Laterality: Left;   COLONOSCOPY WITH PROPOFOL N/A 10/31/2019   Procedure: COLONOSCOPY WITH PROPOFOL;  Surgeon: Virgel Manifold, MD;  Location: [facility];   Service: Endoscopy;  Laterality: N/A;  priority 4   COMBINED AUGMENTATION MAMMAPLASTY AND ABDOMINOPLASTY     CORONARY ARTERY BYPASS GRAFT  08/2017   X2 with LIMA to LAD; 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;Severe 3 vessel CAD-PCI to LAD and LC x(5stents) Coronary dissection of LM to LAD    CORONARY ARTERY BYPASS GRAFT     CORONARY STENT PLACEMENT     PERCUTANEOUS CORONARY STENT INTERVENTION (PCI-S)     REDUCTION MAMMAPLASTY Bilateral 2017   TONSILLECTOMY      Short Social History:  Social History   Tobacco Use   Smoking status: Former    Packs/day: 1.00    Years: 33.00    Pack years: 33.00    Types: Cigarettes   Smokeless tobacco: Never  Substance Use Topics   Alcohol use: Yes    Comment: occasional    Allergies  Allergen Reactions   Ticagrelor Rash    Current Outpatient Medications  Medication Sig Dispense Refill   aspirin EC 81 MG tablet Take 81 mg by mouth daily.     carvedilol (COREG) 25 MG tablet Take 1 tablet (25 mg total) by mouth 2 (two) times daily. 180 tablet 1   clobetasol ointment (TEMOVATE) 0.05 % Apply to affected area every night for 4 weeks, then every other day for 4 weeks and then twice a week for 4 weeks or until resolution. (Patient taking differently: Apply to affected area every night for 4 weeks, then every other day for 4 weeks  and then twice a week for 4 weeks or until resolution as needed.) 30 g 5   dapagliflozin propanediol (FARXIGA) 10 MG TABS tablet Take 1 tablet (10 mg total) by mouth daily. 90 tablet 3   glimepiride (AMARYL) 2 MG tablet TAKE 1 TABLET BY MOUTH  DAILY WITH BREAKFAST 90 tablet 1   icosapent Ethyl (VASCEPA) 1 g capsule Take 2 capsules (2 g total) by mouth 2 (two) times daily. 360 capsule 3   metFORMIN (GLUCOPHAGE) 1000 MG tablet TAKE ONE-HALF TABLET BY  MOUTH TWICE DAILY WITH A  MEAL 90 tablet 1   Multiple Vitamin (MULTIVITAMIN) tablet Take 1 tablet by mouth daily.     oxybutynin (DITROPAN-XL) 5 MG 24 hr tablet TAKE 1 TABLET(5 MG) BY MOUTH  AT BEDTIME 90 tablet 3   rosuvastatin (CRESTOR) 40 MG tablet Take 1 tablet (40 mg total) by mouth daily at 6 PM. 90 tablet 3   sacubitril-valsartan (ENTRESTO) 97-103 MG Take 1 tablet by mouth 2 (two) times daily. 180 tablet 3   spironolactone (ALDACTONE) 25 MG tablet Take 1 tablet (25 mg total) by mouth daily. 90 tablet 3   terconazole (TERAZOL 7) 0.4 % vaginal cream Place 1 applicator vaginally at bedtime. (Patient taking differently: Place 1 applicator vaginally at bedtime. As needed) 45 g 0   No current facility-administered medications for this visit.    Review of Systems  Constitutional: Negative for chills, fatigue, fever and unexpected weight change.  HENT: Negative for trouble swallowing.  Eyes: Negative for loss of vision.  Respiratory: Negative for cough, shortness of breath and wheezing.  Cardiovascular: Negative for chest pain, leg swelling, palpitations and syncope.  GI: Negative for abdominal pain, blood in stool, diarrhea, nausea and vomiting.  GU: Negative for difficulty urinating, dysuria, frequency and hematuria.  Musculoskeletal: Negative for back pain, leg pain and joint pain.  Skin: Negative for rash.  Neurological: Negative for dizziness, headaches, light-headedness, numbness and seizures.  Psychiatric: Negative for behavioral problem, confusion, depressed mood and sleep disturbance.       Objective:  Objective   Vitals:   04/18/21 0951  BP: 120/70  Weight: 180 lb 12.8 oz (82 kg)  Height: 5\' 3"  (1.6 m)   Body mass index is 32.03 kg/m.  Physical Exam Genitourinary:      Comments: Improving area of erythema.  Skin has not yet returned to normal pigmentation.  Loss of labial definition.  Able to see clitoris with some agglutination present above the clitoris.   Assessment/Plan:     66 year old with lichen sclerosis Continued nightly clobetasol application follow-up in 4 weeks.  More than 5 minutes were spent face to face with the patient in the room,  reviewing the medical record, labs and images, and coordinating care for the patient. The plan of management was discussed in detail and counseling was provided.    Adrian Prows MD [facility], [facility] 04/18/2021 10:52 AM

## 2021-04-21 ENCOUNTER — Ambulatory Visit (INDEPENDENT_AMBULATORY_CARE_PROVIDER_SITE_OTHER): Payer: Medicare Other | Admitting: Dermatology

## 2021-04-21 ENCOUNTER — Other Ambulatory Visit: Payer: Self-pay

## 2021-04-21 DIAGNOSIS — L821 Other seborrheic keratosis: Secondary | ICD-10-CM

## 2021-04-21 DIAGNOSIS — L820 Inflamed seborrheic keratosis: Secondary | ICD-10-CM

## 2021-04-21 DIAGNOSIS — L578 Other skin changes due to chronic exposure to nonionizing radiation: Secondary | ICD-10-CM

## 2021-04-21 NOTE — Progress Notes (Signed)
° °  Follow-Up Visit   Subjective  Monique Franklin is a 66 y.o. female who presents for the following: Follow-up (Patient here today for 4 month follow up on aks and isks. Reports still has some spots at forehead she would like treated. ). The patient has spots, moles and lesions to be evaluated, some may be new or changing and the patient has concerns.   The following portions of the chart were reviewed this encounter and updated as appropriate:  Tobacco   Allergies   Meds   Problems   Med Hx   Surg Hx   Fam Hx      Review of Systems: No other skin or systemic complaints except as noted in HPI or Assessment and Plan.  Objective  Well appearing patient in no apparent distress; mood and affect are within normal limits.  A focused examination was performed including face, scalp, chest, shoulders . Relevant physical exam findings are noted in the Assessment and Plan.  right lateral eyebrow x 1, right lower eyelid x 1, left lateral forehead x 2, left temple x 3, left zygoma sideburn x 1 (8) Erythematous stuck-on, waxy papule or plaque   Assessment & Plan  Inflamed seborrheic keratosis (8) right lateral eyebrow x 1, right lower eyelid x 1, left lateral forehead x 2, left temple x 3, left zygoma sideburn x 1  Destruction of lesion - right lateral eyebrow x 1, right lower eyelid x 1, left lateral forehead x 2, left temple x 3, left zygoma sideburn x 1 Complexity: simple   Destruction method: cryotherapy   Informed consent: discussed and consent obtained   Timeout:  patient name, date of birth, surgical site, and procedure verified Lesion destroyed using liquid nitrogen: Yes   Region frozen until ice ball extended beyond lesion: Yes   Outcome: patient tolerated procedure well with no complications   Post-procedure details: wound care instructions given   Additional details:  Prior to procedure, discussed risks of blister formation, small wound, skin dyspigmentation, or rare scar following  cryotherapy. Recommend Vaseline ointment to treated areas while healing.  Seborrheic Keratoses - Stuck-on, waxy, tan-brown papules and/or plaques at chest and shoulders  - Benign-appearing - Discussed benign etiology and prognosis. - Observe - Call for any changes  Actinic Damage - chronic, secondary to cumulative UV radiation exposure/sun exposure over time - diffuse scaly erythematous macules with underlying dyspigmentation - Recommend daily broad spectrum sunscreen SPF 30+ to sun-exposed areas, reapply every 2 hours as needed.  - Recommend staying in the shade or wearing long sleeves, sun glasses (UVA+UVB protection) and wide brim hats (4-inch brim around the entire circumference of the hat). - Call for new or changing lesions.  Return if symptoms worsen or fail to improve.  IAsher Muir, CMA, am acting as scribe for Armida Sans, MD. Documentation: I have reviewed the above documentation for accuracy and completeness, and I agree with the above.  Armida Sans, MD

## 2021-04-21 NOTE — Patient Instructions (Addendum)
Seborrheic Keratosis ? ?What causes seborrheic keratoses? ?Seborrheic keratoses are harmless, common skin growths that first appear during adult life.  As time goes by, more growths appear.  Some people may develop a large number of them.  Seborrheic keratoses appear on both covered and uncovered body parts.  They are not caused by sunlight.  The tendency to develop seborrheic keratoses can be inherited.  They vary in color from skin-colored to gray, brown, or even black.  They can be either smooth or have a rough, warty surface.   ?Seborrheic keratoses are superficial and look as if they were stuck on the skin.  Under the microscope this type of keratosis looks like layers upon layers of skin.  That is why at times the top layer may seem to fall off, but the rest of the growth remains and re-grows.   ? ?Treatment ?Seborrheic keratoses do not need to be treated, but can easily be removed in the office.  Seborrheic keratoses often cause symptoms when they rub on clothing or jewelry.  Lesions can be in the way of shaving.  If they become inflamed, they can cause itching, soreness, or burning.  Removal of a seborrheic keratosis can be accomplished by freezing, burning, or surgery. ?If any spot bleeds, scabs, or grows rapidly, please return to have it checked, as these can be an indication of a skin cancer. ? ? ?Cryotherapy Aftercare ? ?Wash gently with soap and water everyday.   ?Apply Vaseline and Band-Aid daily until healed.  ? ? ?If You Need Anything After Your Visit ? ?If you have any questions or concerns for your doctor, please call our main line at [phone] and press option 4 to reach your doctor's medical assistant. If no one answers, please leave a voicemail as directed and we will return your call as soon as possible. Messages left after 4 pm will be answered the following business day.  ? ?You may also send us a message via MyChart. We typically respond to MyChart messages within 1-2 business  days. ? ?For prescription refills, please ask your pharmacy to contact our office. Our fax number is [phone]. ? ?If you have an urgent issue when the clinic is closed that cannot wait until the next business day, you can page your doctor at the number below.   ? ?Please note that while we do our best to be available for urgent issues outside of office hours, we are not available 24/7.  ? ?If you have an urgent issue and are unable to reach us, you may choose to seek medical care at your doctor's office, retail clinic, urgent care center, or emergency room. ? ?If you have a medical emergency, please immediately call 911 or go to the emergency department. ? ?Pager Numbers ? ?- Dr. Kowalski: [phone] ? ?- Dr. Moye: [phone] ? ?- Dr. Stewart: [phone] ? ?In the event of inclement weather, please call our main line at [phone] for an update on the status of any delays or closures. ? ?Dermatology Medication Tips: ?Please keep the boxes that topical medications come in in order to help keep track of the instructions about where and how to use these. Pharmacies typically print the medication instructions only on the boxes and not directly on the medication tubes.  ? ?If your medication is too expensive, please contact our office at [phone] option 4 or send us a message through MyChart.  ? ?We are unable to tell what your co-pay for medications will be in advance   as this is different depending on your insurance coverage. However, we may be able to find a substitute medication at lower cost or fill out paperwork to get insurance to cover a needed medication.  ? ?If a prior authorization is required to get your medication covered by your insurance company, please allow us 1-2 business days to complete this process. ? ?Drug prices often vary depending on where the prescription is filled and some pharmacies may offer cheaper prices. ? ?The website www.goodrx.com contains coupons for medications through  different pharmacies. The prices here do not account for what the cost may be with help from insurance (it may be cheaper with your insurance), but the website can give you the price if you did not use any insurance.  ?- You can print the associated coupon and take it with your prescription to the pharmacy.  ?- You may also stop by our office during regular business hours and pick up a GoodRx coupon card.  ?- If you need your prescription sent electronically to a different pharmacy, notify our office through [facility] MyChart or by phone at [phone] option 4. ? ? ? ? ?Si Usted Necesita Algo Despu?s de Su Visita ? ?Tambi?n puede enviarnos un mensaje a trav?s de MyChart. Por lo general respondemos a los mensajes de MyChart en el transcurso de 1 a 2 d?as h?biles. ? ?Para renovar recetas, por favor pida a su farmacia que se ponga en contacto con nuestra oficina. Nuestro n?mero de fax es el [phone]. ? ?Si tiene un asunto urgente cuando la cl?nica est? cerrada y que no puede esperar hasta el siguiente d?a h?bil, puede llamar/localizar a su doctor(a) al n?mero que aparece a continuaci?n.  ? ?Por favor, tenga en cuenta que aunque hacemos todo lo posible para estar disponibles para asuntos urgentes fuera del horario de oficina, no estamos disponibles las 24 horas del d?a, los 7 d?as de la semana.  ? ?Si tiene un problema urgente y no puede comunicarse con nosotros, puede optar por buscar atenci?n m?dica  en el consultorio de su doctor(a), en una cl?nica privada, en un centro de atenci?n urgente o en una sala de emergencias. ? ?Si tiene una emergencia m?dica, por favor llame inmediatamente al 911 o vaya a la sala de emergencias. ? ?N?meros de b?per ? ?- Dr. Kowalski: [phone] ? ?- Dra. Moye: [phone] ? ?- Dra. Stewart: [phone] ? ?En caso de inclemencias del tiempo, por favor llame a nuestra l?nea principal al [phone] para una actualizaci?n sobre el estado de cualquier retraso o cierre. ? ?Consejos  para la medicaci?n en dermatolog?a: ?Por favor, guarde las cajas en las que vienen los medicamentos de uso t?pico para ayudarle a seguir las instrucciones sobre d?nde y c?mo usarlos. Las farmacias generalmente imprimen las instrucciones del medicamento s?lo en las cajas y no directamente en los tubos del medicamento.  ? ?Si su medicamento es muy caro, por favor, p?ngase en contacto con nuestra oficina llamando al [phone] y presione la opci?n 4 o env?enos un mensaje a trav?s de MyChart.  ? ?No podemos decirle cu?l ser? su copago por los medicamentos por adelantado ya que esto es diferente dependiendo de la cobertura de su seguro. Sin embargo, es posible que podamos encontrar un medicamento sustituto a menor costo o llenar un formulario para que el seguro cubra el medicamento que se considera necesario.  ? ?Si se requiere una autorizaci?n previa para que su compa??a de seguros cubra su medicamento, por favor perm?tanos de 1 a 2 d?as   h?biles para completar este proceso. ? ?Los precios de los medicamentos var?an con frecuencia dependiendo del lugar de d?nde se surte la receta y alguna farmacias pueden ofrecer precios m?s baratos. ? ?El sitio web www.goodrx.com tiene cupones para medicamentos de diferentes farmacias. Los precios aqu? no tienen en cuenta lo que podr?a costar con la ayuda del seguro (puede ser m?s barato con su seguro), pero el sitio web puede darle el precio si no utiliz? ning?n seguro.  ?- Puede imprimir el cup?n correspondiente y llevarlo con su receta a la farmacia.  ?- Tambi?n puede pasar por nuestra oficina durante el horario de atenci?n regular y recoger una tarjeta de cupones de GoodRx.  ?- Si necesita que su receta se env?e electr?nicamente a una farmacia diferente, informe a nuestra oficina a trav?s de MyChart de [facility] o por tel?fono llamando al [phone] y presione la opci?n 4. ? ?

## 2021-04-25 ENCOUNTER — Encounter: Payer: Self-pay | Admitting: Dermatology

## 2021-04-25 ENCOUNTER — Other Ambulatory Visit: Payer: Self-pay

## 2021-04-25 ENCOUNTER — Ambulatory Visit (INDEPENDENT_AMBULATORY_CARE_PROVIDER_SITE_OTHER): Payer: Medicare Other

## 2021-04-25 ENCOUNTER — Ambulatory Visit (INDEPENDENT_AMBULATORY_CARE_PROVIDER_SITE_OTHER): Payer: Medicare Other | Admitting: Internal Medicine

## 2021-04-25 ENCOUNTER — Encounter: Payer: Self-pay | Admitting: Internal Medicine

## 2021-04-25 VITALS — BP 91/54 | HR 60 | Temp 97.3°F | Resp 17 | Ht 63.0 in | Wt 181.0 lb

## 2021-04-25 DIAGNOSIS — I219 Acute myocardial infarction, unspecified: Secondary | ICD-10-CM

## 2021-04-25 DIAGNOSIS — I428 Other cardiomyopathies: Secondary | ICD-10-CM

## 2021-04-25 DIAGNOSIS — I10 Essential (primary) hypertension: Secondary | ICD-10-CM

## 2021-04-25 DIAGNOSIS — N3281 Overactive bladder: Secondary | ICD-10-CM

## 2021-04-25 DIAGNOSIS — E1169 Type 2 diabetes mellitus with other specified complication: Secondary | ICD-10-CM

## 2021-04-25 DIAGNOSIS — I429 Cardiomyopathy, unspecified: Secondary | ICD-10-CM

## 2021-04-25 DIAGNOSIS — I739 Peripheral vascular disease, unspecified: Secondary | ICD-10-CM

## 2021-04-25 DIAGNOSIS — I2510 Atherosclerotic heart disease of native coronary artery without angina pectoris: Secondary | ICD-10-CM

## 2021-04-25 DIAGNOSIS — E6609 Other obesity due to excess calories: Secondary | ICD-10-CM

## 2021-04-25 DIAGNOSIS — I509 Heart failure, unspecified: Secondary | ICD-10-CM

## 2021-04-25 DIAGNOSIS — E785 Hyperlipidemia, unspecified: Secondary | ICD-10-CM

## 2021-04-25 DIAGNOSIS — K219 Gastro-esophageal reflux disease without esophagitis: Secondary | ICD-10-CM

## 2021-04-25 DIAGNOSIS — N1831 Chronic kidney disease, stage 3a: Secondary | ICD-10-CM

## 2021-04-25 DIAGNOSIS — Z6832 Body mass index (BMI) 32.0-32.9, adult: Secondary | ICD-10-CM

## 2021-04-25 NOTE — Assessment & Plan Note (Addendum)
CMET and lipid profile today Encouraged her to consume a low fat diet Continue ASA, Carvedilol, Rosuvastatin and Vascepa

## 2021-04-25 NOTE — Assessment & Plan Note (Addendum)
No claudication CMET and lipid profile today Encouraged her to consume a low fat diet Continue ASA, Rosuvastatin and Vascepa

## 2021-04-25 NOTE — Assessment & Plan Note (Signed)
Currently not an issue Will monitor 

## 2021-04-25 NOTE — Assessment & Plan Note (Signed)
Continue Carvedilol, Sacubitril-Valsartan and Spironolactone Reinforced DASH diet and exercise for weight loss CMET today Monitor weights

## 2021-04-25 NOTE — Assessment & Plan Note (Signed)
Low today but not symptomatic Continue Carvedilol, Sacubitril-Valsartan and Spironolactone Reinforced DASH diet and exercise for weight loss CMET today

## 2021-04-25 NOTE — Assessment & Plan Note (Addendum)
Continue Carvedilol, Sacubitril-Valsartan and Spironolactone Reinforced DASH diet and exercise for weight loss CMET today

## 2021-04-25 NOTE — Assessment & Plan Note (Signed)
Continue Oxybutynin Encouraged timed voiding

## 2021-04-25 NOTE — Assessment & Plan Note (Addendum)
A1C and urine microalbumin today Encouraged her to consume a low carb diet Continue Metformin and Pioglitazone Encouraged routine eye exams Encouraged routine foot exams Flu, pneumovax UTD Encouraged her to get her covid booster

## 2021-04-25 NOTE — Assessment & Plan Note (Signed)
Encouraged diet and exercise for weight loss ?

## 2021-04-25 NOTE — Progress Notes (Signed)
Subjective:    Patient ID: Monique Franklin, female    DOB: February 22, 1956, 66 y.o.   MRN: [id number]  HPI  Patient presents to clinic today for 95-month follow-up of chronic conditions.  HLD with CAD status post MI with PAD: Her last LDL was 55, triglycerides 267, 01/2021. She denies chest or leg pain. She denies myalgias on Rosuvastatin and Vascepa. She has been trying to consume a low fat diet.  DM2: Her last A1C was 8.8%, 01/2021. She has not beenchecking her sugars lately and her insurance would not cover a continuous glucose monitor. She is taking Metformin and Pioglitizone as prescribed. Her last eye exam was a few months ago, [facility]. Flu 01/2021. Pneumovax 10/2020. Covid Pfizer x 3.  CKD 3: Her last creatinine was 1.21, GFR 50, 02/2021. She is on Valsartan for renal protection.   HTN with Cardiomyopathy: Her BP today is 91/54. She is taking Carvedilol, Sacubitril-Valsartan and Spironolactone as prescribed. ECG from 12/2020 reviewed.  CHF: She denies chronic cough, shortness of breath or lower extremity edema. She is taking Carvedilol, Sacubitril-Valsartan and Spironolactone as prescribed. Echo from 02/2021 reviewed.  GERD: She is not currently taking any medication for this. There is no upper GI on file.  OAB: She reports mainly urinary urgency. She reports her symptoms have improved on Oxybutynin.  Review of Systems     Past Medical History:  Diagnosis Date   Cardiomyopathy (HCC)    CHF (congestive heart failure) (HCC)    Class I   Coronary artery disease    Diabetes mellitus without complication (HCC)    GERD (gastroesophageal reflux disease)    HFrEF (heart failure with reduced ejection fraction) (HCC)    Hyperlipidemia    Hypertension    MI (myocardial infarction) (HCC)    PAD (peripheral artery disease) (HCC)     Current Outpatient Medications  Medication Sig Dispense Refill   aspirin EC 81 MG tablet Take 81 mg by mouth daily.     carvedilol (COREG) 25 MG  tablet Take 1 tablet (25 mg total) by mouth 2 (two) times daily. 180 tablet 1   clobetasol ointment (TEMOVATE) 0.05 % Apply to affected area every night for 4 weeks, then every other day for 4 weeks and then twice a week for 4 weeks or until resolution. (Patient taking differently: Apply to affected area every night for 4 weeks, then every other day for 4 weeks and then twice a week for 4 weeks or until resolution as needed.) 30 g 5   dapagliflozin propanediol (FARXIGA) 10 MG TABS tablet Take 1 tablet (10 mg total) by mouth daily. 90 tablet 3   glimepiride (AMARYL) 2 MG tablet TAKE 1 TABLET BY MOUTH  DAILY WITH BREAKFAST 90 tablet 1   icosapent Ethyl (VASCEPA) 1 g capsule Take 2 capsules (2 g total) by mouth 2 (two) times daily. 360 capsule 3   metFORMIN (GLUCOPHAGE) 1000 MG tablet TAKE ONE-HALF TABLET BY  MOUTH TWICE DAILY WITH A  MEAL 90 tablet 1   Multiple Vitamin (MULTIVITAMIN) tablet Take 1 tablet by mouth daily.     oxybutynin (DITROPAN-XL) 5 MG 24 hr tablet TAKE 1 TABLET(5 MG) BY MOUTH AT BEDTIME 90 tablet 3   rosuvastatin (CRESTOR) 40 MG tablet Take 1 tablet (40 mg total) by mouth daily at 6 PM. 90 tablet 3   sacubitril-valsartan (ENTRESTO) 97-103 MG Take 1 tablet by mouth 2 (two) times daily. 180 tablet 3   spironolactone (ALDACTONE) 25 MG tablet Take 1  tablet (25 mg total) by mouth daily. 90 tablet 3   terconazole (TERAZOL 7) 0.4 % vaginal cream Place 1 applicator vaginally at bedtime. (Patient not taking: Reported on 04/25/2021) 45 g 0   No current facility-administered medications for this visit.    Allergies  Allergen Reactions   Ticagrelor Rash    Family History  Problem Relation Age of Onset   Heart attack Mother    COPD Father    Thyroid cancer Sister    Breast cancer Sister 3   Diabetes Sister    Diabetes Brother     Social History   Socioeconomic History   Marital status: Widowed    Spouse name: Not on file   Number of children: Not on file   Years of education:  Not on file   Highest education level: Not on file  Occupational History   Not on file  Tobacco Use   Smoking status: Former    Packs/day: 1.00    Years: 33.00    Pack years: 33.00    Types: Cigarettes   Smokeless tobacco: Never  Vaping Use   Vaping Use: Never used  Substance and Sexual Activity   Alcohol use: Yes    Comment: occasional   Drug use: Never   Sexual activity: Not Currently    Birth control/protection: Post-menopausal  Other Topics Concern   Not on file  Social History Narrative   Not on file   Social Determinants of Health   Financial Resource Strain: Not on file  Food Insecurity: Not on file  Transportation Needs: Not on file  Physical Activity: Not on file  Stress: Not on file  Social Connections: Not on file  Intimate Partner Violence: Not on file     Constitutional: Denies fever, malaise, fatigue, headache or abrupt weight changes.  HEENT: Denies eye pain, eye redness, ear pain, ringing in the ears, wax buildup, runny nose, nasal congestion, bloody nose, or sore throat. Respiratory: Denies difficulty breathing, shortness of breath, cough or sputum production.   Cardiovascular: Denies chest pain, chest tightness, palpitations or swelling in the hands or feet.  Gastrointestinal: Denies abdominal pain, bloating, constipation, diarrhea or blood in the stool.  GU: Denies urgency, frequency, pain with urination, burning sensation, blood in urine, odor or discharge. Musculoskeletal: Denies decrease in range of motion, difficulty with gait, muscle pain or joint pain and swelling.  Skin: Denies redness, rashes, lesions or ulcercations.  Neurological: Denies dizziness, difficulty with memory, difficulty with speech or problems with balance and coordination.  Psych: Denies anxiety, depression, SI/HI.  No other specific complaints in a complete review of systems (except as listed in HPI above).  Objective:   Physical Exam   BP (!) 91/54 (BP Location: Right  Arm, Patient Position: Sitting, Cuff Size: Normal)    Pulse 60    Temp (!) 97.3 F (36.3 C) (Temporal)    Resp 17    Ht 5\' 3"  (1.6 m)    Wt 181 lb (82.1 kg)    SpO2 99%    BMI 32.06 kg/m   Wt Readings from Last 3 Encounters:  04/18/21 180 lb 12.8 oz (82 kg)  03/17/21 182 lb 9.6 oz (82.8 kg)  02/23/21 186 lb (84.4 kg)    General: Appears her stated age, obese, in NAD. Skin: Warm, dry and intact. No ulcerations noted. HEENT: Head: normal shape and size; Eyes: EOMs intact;  Cardiovascular: Normal rate and rhythm. S1,S2 noted.  No murmur, rubs or gallops noted. No JVD or BLE  edema. No carotid bruits noted. Pulmonary/Chest: Normal effort and positive vesicular breath sounds. No respiratory distress. No wheezes, rales or ronchi noted.  Musculoskeletal: No difficulty with gait.  Neurological: Alert and oriented.   BMET    Component Value Date/Time   NA 137 02/16/2021 0922   NA 139 06/12/2019 0840   K 4.4 02/16/2021 0922   CL 103 02/16/2021 0922   CO2 23 02/16/2021 0922   GLUCOSE 219 (H) 02/16/2021 0922   BUN 19 02/16/2021 0922   BUN 28 (H) 06/12/2019 0840   CREATININE 1.21 (H) 02/16/2021 0922   CREATININE 1.24 (H) 01/18/2021 0847   CALCIUM 9.9 02/16/2021 0922   GFRNONAA 50 (L) 02/16/2021 0922   GFRAA >60 11/03/2019 1139    Lipid Panel     Component Value Date/Time   CHOL 131 01/18/2021 0847   TRIG 237 (H) 01/18/2021 0847   HDL 46 (L) 01/18/2021 0847   CHOLHDL 2.8 01/18/2021 0847   VLDL 53 (H) 08/31/2020 0922   LDLCALC 55 01/18/2021 0847    CBC    Component Value Date/Time   WBC 9.1 05/12/2020 1537   RBC 4.37 05/12/2020 1537   HGB 12.3 05/12/2020 1537   HCT 37.2 05/12/2020 1537   PLT 261.0 05/12/2020 1537   MCV 85.0 05/12/2020 1537   MCHC 33.1 05/12/2020 1537   RDW 15.4 05/12/2020 1537    Hgb A1C Lab Results  Component Value Date   HGBA1C 8.8 (H) 01/18/2021           Assessment & Plan:      Nicki Reaper, NP This visit occurred during the SARS-CoV-2  public health emergency.  Safety protocols were in place, including screening questions prior to the visit, additional usage of staff PPE, and extensive cleaning of exam room while observing appropriate contact time as indicated for disinfecting solutions.

## 2021-04-25 NOTE — Assessment & Plan Note (Signed)
CMET today Continue Valsartan

## 2021-04-25 NOTE — Assessment & Plan Note (Addendum)
CMET and lipid profile today °Encouraged her to consume a low fat diet °Continue ASA, Carvedilol, Rosuvastatin and Vascepa °

## 2021-04-25 NOTE — Assessment & Plan Note (Signed)
CMET and lipid profile today Encouraged her to consume a low fat diet Continue Rosuvastatin and Vascepa

## 2021-04-25 NOTE — Patient Instructions (Signed)

## 2021-04-26 LAB — LIPID PANEL
Cholesterol: 141 mg/dL (ref <200)
HDL: 55 mg/dL (ref >=50)
LDL Cholesterol (Calc): 58 mg/dL (calc)
Non-HDL Cholesterol (Calc): 86 mg/dL (calc) (ref <130)
Total CHOL/HDL Ratio: 2.6 (calc) (ref <5.0)
Triglycerides: 210 mg/dL — ABNORMAL HIGH (ref <150)

## 2021-04-26 LAB — COMPLETE METABOLIC PANEL WITH GFR
AG Ratio: 1.4 (calc) (ref 1.0–2.5)
ALT: 15 U/L (ref 6–29)
AST: 10 U/L (ref 10–35)
Albumin: 4.2 g/dL (ref 3.6–5.1)
Alkaline phosphatase (APISO): 50 U/L (ref 37–153)
BUN/Creatinine Ratio: 22 (calc) (ref 6–22)
BUN: 24 mg/dL (ref 7–25)
CO2: 26 mmol/L (ref 20–32)
Calcium: 9.9 mg/dL (ref 8.6–10.4)
Chloride: 105 mmol/L (ref 98–110)
Creat: 1.09 mg/dL — ABNORMAL HIGH (ref 0.50–1.05)
Globulin: 3 g/dL (calc) (ref 1.9–3.7)
Glucose, Bld: 232 mg/dL — ABNORMAL HIGH (ref 65–99)
Potassium: 4.6 mmol/L (ref 3.5–5.3)
Sodium: 139 mmol/L (ref 135–146)
Total Bilirubin: 0.3 mg/dL (ref 0.2–1.2)
Total Protein: 7.2 g/dL (ref 6.1–8.1)
eGFR: 56 mL/min/{1.73_m2} — ABNORMAL LOW (ref >=60)

## 2021-04-26 LAB — CUP PACEART REMOTE DEVICE CHECK
Battery Remaining Longevity: 74 mo
Battery Remaining Percentage: 72 %
Battery Voltage: 2.98 V
Brady Statistic RV Percent Paced: 1 %
Date Time Interrogation Session: 20230116020017
HighPow Impedance: 83 Ohm
HighPow Impedance: 83 Ohm
Implantable Lead Implant Date: 20200707
Implantable Lead Location: 753860
Implantable Pulse Generator Implant Date: 20200707
Lead Channel Impedance Value: 700 Ohm
Lead Channel Pacing Threshold Amplitude: 0.5 V
Lead Channel Pacing Threshold Pulse Width: 0.5 ms
Lead Channel Sensing Intrinsic Amplitude: 11.7 mV
Lead Channel Setting Pacing Amplitude: 2.5 V
Lead Channel Setting Pacing Pulse Width: 0.5 ms
Lead Channel Setting Sensing Sensitivity: 0.5 mV
Pulse Gen Serial Number: 9816687

## 2021-04-26 LAB — MICROALBUMIN / CREATININE URINE RATIO
Creatinine, Urine: 67 mg/dL (ref 20–275)
Microalb Creat Ratio: 176 mcg/mg creat — ABNORMAL HIGH (ref <30)
Microalb, Ur: 11.8 mg/dL

## 2021-04-26 LAB — CBC
HCT: 35.9 % (ref 35.0–45.0)
Hemoglobin: 11.8 g/dL (ref 11.7–15.5)
MCH: 27.9 pg (ref 27.0–33.0)
MCHC: 32.9 g/dL (ref 32.0–36.0)
MCV: 84.9 fL (ref 80.0–100.0)
MPV: 10.1 fL (ref 7.5–12.5)
Platelets: 232 10*3/uL (ref 140–400)
RBC: 4.23 10*6/uL (ref 3.80–5.10)
RDW: 14.6 % (ref 11.0–15.0)
WBC: 7.8 10*3/uL (ref 3.8–10.8)

## 2021-04-26 LAB — HEMOGLOBIN A1C
Hgb A1c MFr Bld: 8.6 % of total Hgb — ABNORMAL HIGH (ref <5.7)
Mean Plasma Glucose: 200 mg/dL
eAG (mmol/L): 11.1 mmol/L

## 2021-04-26 MED ORDER — GLIMEPIRIDE 4 MG PO TABS: 8.0000 mg | ORAL_TABLET | Freq: Every day | ORAL | 0 refills | Status: DC

## 2021-04-26 NOTE — Addendum Note (Signed)
Addended by: Jearld Fenton on: 04/26/2021 11:25 AM   Modules accepted: Orders

## 2021-05-04 NOTE — Progress Notes (Signed)
Remote ICD transmission.   

## 2021-05-11 ENCOUNTER — Other Ambulatory Visit: Payer: Self-pay | Admitting: Internal Medicine

## 2021-05-12 NOTE — Telephone Encounter (Signed)
Requested medication (s) are due for refill today: yes  Requested medication (s) are on the active medication list: yes  Last refill:  03/17/21 (mail order)  Future visit scheduled: no  Notes to clinic:  Failed protocol of labs within 720 days, No B12,  no upcoming appt but was just seen in Jan, please assess.    Requested Prescriptions  Pending Prescriptions Disp Refills   metFORMIN (GLUCOPHAGE) 1000 MG tablet [Pharmacy Med Name: metFORMIN HCl 1000 MG Oral Tablet] 90 tablet 3    Sig: TAKE ONE-HALF TABLET BY  MOUTH TWICE DAILY WITH A  MEAL     Endocrinology:  Diabetes - Biguanides Failed - 05/11/2021 11:08 PM      Failed - Cr in normal range and within 360 days    Creat  Date Value Ref Range Status  04/25/2021 1.09 (H) 0.50 - 1.05 mg/dL Final   Creatinine, Urine  Date Value Ref Range Status  04/25/2021 67 20 - 275 mg/dL Final          Failed - HBA1C is between 0 and 7.9 and within 180 days    Hgb A1c MFr Bld  Date Value Ref Range Status  04/25/2021 8.6 (H) <5.7 % of total Hgb Final    Comment:    For someone without known diabetes, a hemoglobin A1c value of 6.5% or greater indicates that they may have  diabetes and this should be confirmed with a follow-up  test. . For someone with known diabetes, a value <7% indicates  that their diabetes is well controlled and a value  greater than or equal to 7% indicates suboptimal  control. A1c targets should be individualized based on  duration of diabetes, age, comorbid conditions, and  other considerations. . Currently, no consensus exists regarding use of hemoglobin A1c for diagnosis of diabetes for children. .           Failed - eGFR in normal range and within 360 days    GFR calc Af Amer  Date Value Ref Range Status  11/03/2019 >60 >60 mL/min Final   GFR, Estimated  Date Value Ref Range Status  02/16/2021 50 (L) >60 mL/min Final    Comment:    (NOTE) Calculated using the CKD-EPI Creatinine Equation (2021)     GFR  Date Value Ref Range Status  05/12/2020 53.53 (L) >60.00 mL/min Final    Comment:    Calculated using the CKD-EPI Creatinine Equation (2021)   eGFR  Date Value Ref Range Status  04/25/2021 56 (L) > OR = 60 mL/min/1.49m Final    Comment:    The eGFR is based on the CKD-EPI 2021 equation. To calculate  the new eGFR from a previous Creatinine or Cystatin C result, go to https://www.kidney.org/professionals/ kdoqi/gfr%5Fcalculator           Failed - B12 Level in normal range and within 720 days    No results found for: VITAMINB12        Failed - CBC within normal limits and completed in the last 12 months    WBC  Date Value Ref Range Status  04/25/2021 7.8 3.8 - 10.8 Thousand/uL Final   RBC  Date Value Ref Range Status  04/25/2021 4.23 3.80 - 5.10 Million/uL Final   Hemoglobin  Date Value Ref Range Status  04/25/2021 11.8 11.7 - 15.5 g/dL Final   HCT  Date Value Ref Range Status  04/25/2021 35.9 35.0 - 45.0 % Final   MCHC  Date Value Ref Range Status  04/25/2021 32.9 32.0 - 36.0 g/dL Final   [facility]  Date Value Ref Range Status  04/25/2021 27.9 27.0 - 33.0 pg Final   MCV  Date Value Ref Range Status  04/25/2021 84.9 80.0 - 100.0 fL Final   No results found for: PLTCOUNTKUC, LABPLAT, POCPLA RDW  Date Value Ref Range Status  04/25/2021 14.6 11.0 - 15.0 % Final         Passed - Valid encounter within last 6 months    Recent Outpatient Visits           2 weeks ago Type 2 diabetes mellitus with other specified complication, without long-term current use of insulin [facility])   [facility], Coralie Keens, NP   3 months ago Influenza vaccine administered   [facility] [place], [place] W, NP   7 months ago Type 2 diabetes mellitus with other specified complication, without long-term current use of insulin [facility])   [facility] [place], Coralie Keens, NP       Future Appointments             In 1 month Agbor-Etang,  Aaron Edelman, MD [facility], [facility]

## 2021-05-18 ENCOUNTER — Other Ambulatory Visit (HOSPITAL_COMMUNITY): Payer: Self-pay | Admitting: Cardiology

## 2021-05-19 ENCOUNTER — Other Ambulatory Visit (HOSPITAL_COMMUNITY): Payer: Self-pay | Admitting: Cardiology

## 2021-05-19 ENCOUNTER — Other Ambulatory Visit (HOSPITAL_COMMUNITY): Payer: Medicare Other

## 2021-05-25 ENCOUNTER — Ambulatory Visit (INDEPENDENT_AMBULATORY_CARE_PROVIDER_SITE_OTHER): Payer: Medicare Other | Admitting: Obstetrics and Gynecology

## 2021-05-25 ENCOUNTER — Other Ambulatory Visit: Payer: Self-pay

## 2021-05-25 ENCOUNTER — Encounter: Payer: Self-pay | Admitting: Obstetrics and Gynecology

## 2021-05-25 VITALS — BP 116/74 | Ht 63.0 in | Wt 188.0 lb

## 2021-05-25 DIAGNOSIS — R350 Frequency of micturition: Secondary | ICD-10-CM

## 2021-05-25 DIAGNOSIS — Z1231 Encounter for screening mammogram for malignant neoplasm of breast: Secondary | ICD-10-CM | POA: Diagnosis not present

## 2021-05-25 DIAGNOSIS — B3731 Acute candidiasis of vulva and vagina: Secondary | ICD-10-CM | POA: Diagnosis not present

## 2021-05-25 MED ORDER — FLUCONAZOLE 150 MG PO TABS: 150.0000 mg | ORAL_TABLET | ORAL | 0 refills | Status: AC

## 2021-05-25 MED ORDER — OXYBUTYNIN CHLORIDE ER 5 MG PO TB24: ORAL_TABLET | ORAL | 3 refills | Status: DC

## 2021-05-25 NOTE — Progress Notes (Signed)
Patient ID: Monique Franklin, female   DOB: 01/24/1956, 67 y.o.   MRN: [id number]  Reason for Consult: Follow-up (Follow up on her Lichen Sclerosus)   Referred by Jearld Fenton, NP  Subjective:     HPI:  Monique Franklin is a 66 y.o. female She has been applying clobetasol for lichen sclerosis nightly. She reports that sometimes she has vulvar pain after application and she will wipe off the clobetasol and apply vagasil and feel better.  She has noticed white flaky skin.   Gynecological History  No LMP recorded. Patient is postmenopausal.  Past Medical History:  Diagnosis Date   Cardiomyopathy ([place])    CHF (congestive heart failure) (HCC)    Class I   Coronary artery disease    Diabetes mellitus without complication (HCC)    GERD (gastroesophageal reflux disease)    HFrEF (heart failure with reduced ejection fraction) ([place])    Hyperlipidemia    Hypertension    MI (myocardial infarction) ([place])    PAD (peripheral artery disease) ([place])    Family History  Problem Relation Age of Onset   Heart attack Mother    COPD Father    Thyroid cancer Sister    Breast cancer Sister 86   Diabetes Sister    Diabetes Brother    Past Surgical History:  Procedure Laterality Date   BREAST BIOPSY Bilateral 2016   neg   CARDIAC CATHETERIZATION     CARDIAC DEFIBRILLATOR PLACEMENT     CATARACT EXTRACTION Bilateral    CATARACT EXTRACTION W/PHACO Right 11/09/2020   Procedure: CATARACT EXTRACTION PHACO AND INTRAOCULAR LENS PLACEMENT ([place]) RIGHT VIVITY  DIABETIC;  Surgeon: Birder Robson, MD;  Location: [facility];  Service: Ophthalmology;  Laterality: Right;  2.85 00:33.2   CATARACT EXTRACTION W/PHACO Left 11/30/2020   Procedure: CATARACT EXTRACTION PHACO AND INTRAOCULAR LENS PLACEMENT ([facility]) LEFT DIABETIC VIVITY 3.86 00:28.3;  Surgeon: Birder Robson, MD;  Location: [facility];  Service: Ophthalmology;  Laterality: Left;   COLONOSCOPY WITH PROPOFOL N/A 10/31/2019    Procedure: COLONOSCOPY WITH PROPOFOL;  Surgeon: Virgel Manifold, MD;  Location: [facility];  Service: Endoscopy;  Laterality: N/A;  priority 4   COMBINED AUGMENTATION MAMMAPLASTY AND ABDOMINOPLASTY     CORONARY ARTERY BYPASS GRAFT  08/2017   X2 with LIMA to LAD; 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;Severe 3 vessel CAD-PCI to LAD and LC x(5stents) Coronary dissection of LM to LAD    CORONARY ARTERY BYPASS GRAFT     CORONARY STENT PLACEMENT     PERCUTANEOUS CORONARY STENT INTERVENTION (PCI-S)     REDUCTION MAMMAPLASTY Bilateral 2017   TONSILLECTOMY      Short Social History:  Social History   Tobacco Use   Smoking status: Former    Packs/day: 1.00    Years: 33.00    Pack years: 33.00    Types: Cigarettes   Smokeless tobacco: Never  Substance Use Topics   Alcohol use: Yes    Comment: occasional    Allergies  Allergen Reactions   Ticagrelor Rash    Current Outpatient Medications  Medication Sig Dispense Refill   aspirin EC 81 MG tablet Take 81 mg by mouth daily.     carvedilol (COREG) 25 MG tablet Take 1 tablet (25 mg total) by mouth 2 (two) times daily. 180 tablet 1   clobetasol ointment (TEMOVATE) 0.05 % Apply to affected area every night for 4 weeks, then every other day for 4 weeks and then twice a week for 4 weeks or until resolution. (  Patient taking differently: Apply to affected area every night for 4 weeks, then every other day for 4 weeks and then twice a week for 4 weeks or until resolution as needed.) 30 g 5   FARXIGA 10 MG TABS tablet TAKE 1 TABLET BY MOUTH  DAILY 90 tablet 3   fluconazole (DIFLUCAN) 150 MG tablet Take 1 tablet (150 mg total) by mouth every 3 (three) days for 2 doses. 2 tablet 0   glimepiride (AMARYL) 4 MG tablet Take 2 tablets (8 mg total) by mouth daily with breakfast. 180 tablet 0   icosapent Ethyl (VASCEPA) 1 g capsule Take 2 capsules (2 g total) by mouth 2 (two) times daily. 360 capsule 3   metFORMIN (GLUCOPHAGE) 1000 MG tablet TAKE ONE-HALF  TABLET BY  MOUTH TWICE DAILY WITH A  MEAL 90 tablet 0   Multiple Vitamin (MULTIVITAMIN) tablet Take 1 tablet by mouth daily.     rosuvastatin (CRESTOR) 40 MG tablet TAKE 1 TABLET BY MOUTH  DAILY AT 6PM 90 tablet 3   sacubitril-valsartan (ENTRESTO) 97-103 MG Take 1 tablet by mouth 2 (two) times daily. 180 tablet 3   spironolactone (ALDACTONE) 25 MG tablet Take 1 tablet (25 mg total) by mouth daily. 90 tablet 3   oxybutynin (DITROPAN-XL) 5 MG 24 hr tablet TAKE 1 TABLET(5 MG) BY MOUTH AT BEDTIME 90 tablet 3   No current facility-administered medications for this visit.    Review of Systems  Constitutional: Negative for chills, fatigue, fever and unexpected weight change.  HENT: Negative for trouble swallowing.  Eyes: Negative for loss of vision.  Respiratory: Negative for cough, shortness of breath and wheezing.  Cardiovascular: Negative for chest pain, leg swelling, palpitations and syncope.  GI: Negative for abdominal pain, blood in stool, diarrhea, nausea and vomiting.  GU: Negative for difficulty urinating, dysuria, frequency and hematuria.  Musculoskeletal: Negative for back pain, leg pain and joint pain.  Skin: Negative for rash.  Neurological: Negative for dizziness, headaches, light-headedness, numbness and seizures.  Psychiatric: Negative for behavioral problem, confusion, depressed mood and sleep disturbance.       Objective:  Objective   Vitals:   05/25/21 0923  BP: 116/74  Weight: 188 lb (85.3 kg)  Height: 5\' 3"  (1.6 m)   Body mass index is 33.3 kg/m.  Physical Exam Vitals and nursing note reviewed. Exam conducted with a chaperone present.  Constitutional:      Appearance: Normal appearance. She is well-developed.  HENT:     Head: Normocephalic and atraumatic.  Eyes:     Extraocular Movements: Extraocular movements intact.     Pupils: Pupils are equal, round, and reactive to light.  Cardiovascular:     Rate and Rhythm: Normal rate and regular rhythm.   Pulmonary:     Effort: Pulmonary effort is normal. No respiratory distress.     Breath sounds: Normal breath sounds.  Abdominal:     General: Abdomen is flat.     Palpations: Abdomen is soft.  Genitourinary:      Comments: Improving area of erythema, now light pink skin.  Skin has not yet returned to normal pigmentation but continues to improve.  White discharge present at the edges of the skin changes. Loss of labial definition.  Able to see clitoris with some agglutination present above the clitoris.   Musculoskeletal:        General: No signs of injury.  Skin:    General: Skin is warm and dry.  Neurological:     Mental  Status: She is alert and oriented to person, place, and time.  Psychiatric:        Behavior: Behavior normal.        Thought Content: Thought content normal.        Judgment: Judgment normal.    Assessment/Plan:    67 yo with lichen sclerosis Continue nightly clobetasol application. Topical yeast vaginitis. Diflucan rx sent.  OAB- refill for oxybutynin sent.  Mammogram ordered  More than 20 minutes were spent face to face with the patient in the room, reviewing the medical record, labs and images, and coordinating care for the patient. The plan of management was discussed in detail and counseling was provided.       Adrian Prows MD [facility], [facility] 05/25/2021 9:50 AM

## 2021-05-31 ENCOUNTER — Other Ambulatory Visit: Payer: Self-pay

## 2021-05-31 ENCOUNTER — Ambulatory Visit
Admission: RE | Admit: 2021-05-31 | Discharge: 2021-05-31 | Disposition: A | Discharge status: Home / Self Care | Payer: Medicare Other | Source: Ambulatory Visit | Attending: Obstetrics and Gynecology | Admitting: Obstetrics and Gynecology

## 2021-05-31 DIAGNOSIS — Z1231 Encounter for screening mammogram for malignant neoplasm of breast: Secondary | ICD-10-CM | POA: Diagnosis not present

## 2021-06-14 ENCOUNTER — Other Ambulatory Visit (HOSPITAL_COMMUNITY): Payer: Self-pay | Admitting: Cardiology

## 2021-06-17 ENCOUNTER — Encounter: Payer: Self-pay | Admitting: Internal Medicine

## 2021-06-20 ENCOUNTER — Other Ambulatory Visit: Payer: Self-pay | Admitting: Internal Medicine

## 2021-06-21 NOTE — Telephone Encounter (Signed)
Requested medication (s) are due for refill today: yes (mail order) ? ?Requested medication (s) are on the active medication list: Not with this dose/signature/instructions ? ?Last refill:  04/26/21 #180 with 0 RF ? ?Future visit scheduled: no ? ?Notes to clinic:  There was a message sent recently about confusion on what dose and how many, the request has signature/instructions that I do not even see on previous discontinued med. Pt already was questioning dose, please assess. ? ? ?  ? ?Requested Prescriptions  ?Pending Prescriptions Disp Refills  ? glimepiride (AMARYL) 4 MG tablet [Pharmacy Med Name: Glimepiride 4 MG Oral Tablet] 180 tablet 3  ?  Sig: TAKE 1 TABLET BY MOUTH TWICE  DAILY WITH MEALS  ?  ? Endocrinology:  Diabetes - Sulfonylureas Failed - 06/20/2021 10:25 PM  ?  ?  Failed - HBA1C is between 0 and 7.9 and within 180 days  ?  Hgb A1c MFr Bld  ?Date Value Ref Range Status  ?04/25/2021 8.6 (H) <5.7 % of total Hgb Final  ?  Comment:  ?  For someone without known diabetes, a hemoglobin A1c ?value of 6.5% or greater indicates that they may have  ?diabetes and this should be confirmed with a follow-up  ?test. ?. ?For someone with known diabetes, a value <7% indicates  ?that their diabetes is well controlled and a value  ?greater than or equal to 7% indicates suboptimal  ?control. A1c targets should be individualized based on  ?duration of diabetes, age, comorbid conditions, and  ?other considerations. ?. ?Currently, no consensus exists regarding use of ?hemoglobin A1c for diagnosis of diabetes for children. ?. ?  ?  ?  ?  ?  Failed - Cr in normal range and within 360 days  ?  Creat  ?Date Value Ref Range Status  ?04/25/2021 1.09 (H) 0.50 - 1.05 mg/dL Final  ? ?Creatinine, Urine  ?Date Value Ref Range Status  ?04/25/2021 67 20 - 275 mg/dL Final  ?  ?  ?  ?  Passed - Valid encounter within last 6 months  ?  Recent Outpatient Visits   ? ?      ? 1 month ago Type 2 diabetes mellitus with other specified  complication, without long-term current use of insulin ([place])  ? [facility] [place], [place] W, NP  ? 5 months ago Influenza vaccine administered  ? [facility] [place], [place] W, NP  ? 8 months ago Type 2 diabetes mellitus with other specified complication, without long-term current use of insulin ([place])  ? [facility] [place], Coralie Keens, NP  ? ?  ?  ?Future Appointments   ? ?        ? In 2 days Agbor-Etang, Aaron Edelman, MD [facility], [facility]  ? ?  ? ?  ?  ?  ? ? ?

## 2021-06-23 ENCOUNTER — Other Ambulatory Visit: Payer: Self-pay

## 2021-06-23 ENCOUNTER — Ambulatory Visit (INDEPENDENT_AMBULATORY_CARE_PROVIDER_SITE_OTHER): Payer: Medicare Other | Admitting: Cardiology

## 2021-06-23 ENCOUNTER — Encounter: Payer: Self-pay | Admitting: Cardiology

## 2021-06-23 VITALS — BP 122/60 | HR 73 | Ht 63.0 in | Wt 184.0 lb

## 2021-06-23 DIAGNOSIS — I255 Ischemic cardiomyopathy: Secondary | ICD-10-CM | POA: Diagnosis not present

## 2021-06-23 DIAGNOSIS — I2510 Atherosclerotic heart disease of native coronary artery without angina pectoris: Secondary | ICD-10-CM | POA: Diagnosis not present

## 2021-06-23 DIAGNOSIS — Z95810 Presence of automatic (implantable) cardiac defibrillator: Secondary | ICD-10-CM | POA: Diagnosis not present

## 2021-06-23 NOTE — Progress Notes (Signed)
?Cardiology Office Note:   ? ?Date:  06/23/2021  ? ?ID:  Monique Franklin, DOB Dec 26, 1955, MRN [id number] ? ?PCP:  Jearld Fenton, NP  ?Cardiologist:  Kate Sable, MD  ?Electrophysiologist:  None  ? ?Referring MD: Jearld Fenton, NP  ? ?Chief Complaint  ?Patient presents with  ? Other  ?  6 month follow up -- Meds reviewed verbally with patient.   ? ? ?History of Present Illness:   ? ?Monique Franklin is a 66 y.o. female with a hx of hypertension, hyperlipidemia, former smoker x20+ years, CAD, PCI to LAD and LCx(5 stents total), status post CABG x2 (LIMA to LAD, SVG to OM 2019), HFrEF EF, s/p ICD 2020([employer] device), PAD(left femoral thrombo-endarterectomy with patch angioplasty 12/2017) who presents for follow-up. ? ?Patient being seen for CAD, cardiomyopathy.  Taking all medications as prescribed, denies chest pain, shortness of breath, edema.  She did establish care with device clinic to monitor AICD.  Feels well, reports no concerns at this time, tolerating all medications as prescribed. ? ? ?Prior notes ?Echo 02/2021 EF 35 to 40% ?Patient was originally seen to establish care.  She used to be followed at [facility] and later moved into the area.   ?After her CABG in 2019, she had a left femoral artery complication requiring left femoral patch angioplasty.  Earlier in 2020, she had an ICD placed due to low EF with ejection fraction of 31%.   ? ? ? ?Past Medical History:  ?Diagnosis Date  ? Cardiomyopathy ([place])   ? CHF (congestive heart failure) ([place])   ? Class I  ? Coronary artery disease   ? Diabetes mellitus without complication ([place])   ? GERD (gastroesophageal reflux disease)   ? HFrEF (heart failure with reduced ejection fraction) ([place])   ? Hyperlipidemia   ? Hypertension   ? MI (myocardial infarction) ([place])   ? PAD (peripheral artery disease) ([place])   ? ? ?Past Surgical History:  ?Procedure Laterality Date  ? BREAST BIOPSY Bilateral 2016  ? neg  ? CARDIAC CATHETERIZATION    ? CARDIAC DEFIBRILLATOR  PLACEMENT    ? CATARACT EXTRACTION Bilateral   ? CATARACT EXTRACTION W/PHACO Right 11/09/2020  ? Procedure: CATARACT EXTRACTION PHACO AND INTRAOCULAR LENS PLACEMENT ([place]) RIGHT VIVITY  DIABETIC;  Surgeon: Birder Robson, MD;  Location: [place];  Service: Ophthalmology;  Laterality: Right;  2.85 ?00:33.2  ? CATARACT EXTRACTION W/PHACO Left 11/30/2020  ? Procedure: CATARACT EXTRACTION PHACO AND INTRAOCULAR LENS PLACEMENT ([facility]) LEFT DIABETIC VIVITY 3.86 00:28.3;  Surgeon: Birder Robson, MD;  Location: [place];  Service: Ophthalmology;  Laterality: Left;  ? COLONOSCOPY WITH PROPOFOL N/A 10/31/2019  ? Procedure: COLONOSCOPY WITH PROPOFOL;  Surgeon: Virgel Manifold, MD;  Location: [facility];  Service: Endoscopy;  Laterality: N/A;  priority 4  ? COMBINED AUGMENTATION MAMMAPLASTY AND ABDOMINOPLASTY    ? CORONARY ARTERY BYPASS GRAFT  08/2017  ? X2 with LIMA to LAD; 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;Severe 3 vessel CAD-PCI to LAD and LC x(5stents) Coronary dissection of LM to LAD   ? CORONARY ARTERY BYPASS GRAFT    ? CORONARY STENT PLACEMENT    ? PERCUTANEOUS CORONARY STENT INTERVENTION (PCI-S)    ? REDUCTION MAMMAPLASTY Bilateral 2017  ? TONSILLECTOMY    ? ? ?Current Medications: ?Current Meds  ?Medication Sig  ? aspirin EC 81 MG tablet Take 81 mg by mouth daily.  ? carvedilol (COREG) 25 MG tablet Take 1 tablet (25 mg total) by mouth 2 (two) times daily.  ?  clobetasol ointment (TEMOVATE) 0.05 % Apply to affected area every night for 4 weeks, then every other day for 4 weeks and then twice a week for 4 weeks or until resolution.  ? ENTRESTO 97-103 MG TAKE 1 TABLET BY MOUTH  TWICE DAILY  ? FARXIGA 10 MG TABS tablet TAKE 1 TABLET BY MOUTH  DAILY  ? glimepiride (AMARYL) 4 MG tablet Take 1 tablet (4 mg total) by mouth daily with breakfast.  ? icosapent Ethyl (VASCEPA) 1 g capsule Take 2 capsules (2 g total) by mouth 2 (two) times daily.  ? metFORMIN (GLUCOPHAGE) 1000 MG tablet TAKE ONE-HALF  TABLET BY  MOUTH TWICE DAILY WITH A  MEAL  ? Multiple Vitamin (MULTIVITAMIN) tablet Take 1 tablet by mouth daily.  ? oxybutynin (DITROPAN-XL) 5 MG 24 hr tablet TAKE 1 TABLET(5 MG) BY MOUTH AT BEDTIME  ? rosuvastatin (CRESTOR) 40 MG tablet TAKE 1 TABLET BY MOUTH  DAILY AT 6PM  ? spironolactone (ALDACTONE) 25 MG tablet Take 1 tablet (25 mg total) by mouth daily.  ?  ? ?Allergies:   Ticagrelor  ? ?Social History  ? ?Socioeconomic History  ? Marital status: Widowed  ?  Spouse name: Not on file  ? Number of children: Not on file  ? Years of education: Not on file  ? Highest education level: Not on file  ?Occupational History  ? Not on file  ?Tobacco Use  ? Smoking status: Former  ?  Packs/day: 1.00  ?  Years: 33.00  ?  Pack years: 33.00  ?  Types: Cigarettes  ? Smokeless tobacco: Never  ?Vaping Use  ? Vaping Use: Never used  ?Substance and Sexual Activity  ? Alcohol use: Yes  ?  Comment: occasional  ? Drug use: Never  ? Sexual activity: Not Currently  ?  Birth control/protection: Post-menopausal  ?Other Topics Concern  ? Not on file  ?Social History Narrative  ? Not on file  ? ?Social Determinants of Health  ? ?Financial Resource Strain: Not on file  ?Food Insecurity: Not on file  ?Transportation Needs: Not on file  ?Physical Activity: Not on file  ?Stress: Not on file  ?Social Connections: Not on file  ?  ? ?Family History: ?The patient's family history includes Breast cancer (age of onset: 7) in her sister; COPD in her father; Diabetes in her brother and sister; Heart attack in her mother; Thyroid cancer in her sister. ? ?ROS:   ?Please see the history of present illness.    ? All other systems reviewed and are negative. ? ?EKGs/Labs/Other Studies Reviewed:   ? ?The following studies were reviewed today: ? ?EKG:  EKG is ordered today.  EKG shows normal sinus rhythm, ? ?Recent Labs: ?01/31/2021: TSH 1.970 ?04/25/2021: ALT 15; BUN 24; Creat 1.09; Hemoglobin 11.8; Platelets 232; Potassium 4.6; Sodium 139  ?Recent Lipid  Panel ?   ?Component Value Date/Time  ? CHOL 141 04/25/2021 0920  ? TRIG 210 (H) 04/25/2021 0920  ? HDL 55 04/25/2021 0920  ? CHOLHDL 2.6 04/25/2021 0920  ? VLDL 53 (H) 08/31/2020 [id number]  ? [place] 58 04/25/2021 0920  ? LDLDIRECT 59.0 05/12/2020 1537  ? ? ?Physical Exam:   ? ?VS:  BP 122/60 (BP Location: Left Arm, Patient Position: Sitting, Cuff Size: Normal)   Pulse 73   Ht 5\' 3"  (1.6 m)   Wt 184 lb (83.5 kg)   SpO2 96%   BMI 32.59 kg/m?    ? ?Wt Readings from Last 3 Encounters:  ?06/23/21  184 lb (83.5 kg)  ?05/25/21 188 lb (85.3 kg)  ?04/25/21 181 lb (82.1 kg)  ?  ? ?GEN:  Well nourished, well developed in no acute distress ?HEENT: Normal ?NECK: No JVD; No carotid bruits ?CARDIAC: RRR, no murmurs, rubs, gallops ?RESPIRATORY:  Clear to auscultation without rales, wheezing or rhonchi  ?ABDOMEN: Soft, non-tender, non-distended ?MUSCULOSKELETAL:  No edema; No deformity  ?SKIN: Warm and dry ?NEUROLOGIC:  Alert and oriented x 3 ?PSYCHIATRIC:  Normal affect  ? ?ASSESSMENT:   ? ?1. Ischemic cardiomyopathy   ?2. Coronary artery disease involving native coronary artery of native heart without angina pectoris   ?3. ICD (implantable cardioverter-defibrillator) in place   ? ? ?PLAN:   ? ?In order of problems listed above: ? ?Ischemic cardiomyopathy EF 35-40%, s/p ICD.  NYHA class II symptoms.  Blood pressure controlled.  Continue CHF medications including Coreg 25 mg twice daily, Entresto 97-103, Aldactone 25 mg daily, Farxiga 10 mg daily.   ?CAD, PCI's, CABG x2.  Denies chest pain.  0t. continue aspirin 81 mg, Crestor 40 mg daily. ?S/p AICD.  Keep appointments with device clinic for frequent checks. ? ? ?Follow-up in 6 months ? ?Total encounter time 40 minutes ? Greater than 50% was spent in counseling and coordination of care with the patient ? ? ?Medication Adjustments/Labs and Tests Ordered: ?Current medicines are reviewed at length with the patient today.  Concerns regarding medicines are outlined above.  ?Orders  Placed This Encounter  ?Procedures  ? EKG 12-Lead  ? ? ?No orders of the defined types were placed in this encounter. ? ? ? ?Patient Instructions  ?Medication Instructions:  ?Your physician recommends that you conti

## 2021-06-23 NOTE — Patient Instructions (Signed)
Medication Instructions:  ? ?Your physician recommends that you continue on your current medications as directed. Please refer to the Current Medication list given to you today. ? ?*If you need a refill on your cardiac medications before your next appointment, please call your pharmacy* ? ? ?Lab Work: ? ?None ordered ? ?If you have labs (blood work) drawn today and your tests are completely normal, you will receive your results only by: ?MyChart Message (if you have MyChart) OR ?A paper copy in the mail ?If you have any lab test that is abnormal or we need to change your treatment, we will call you to review the results. ? ? ?Testing/Procedures: ? ?None ordered ? ? ?Follow-Up: ?At [facility], you and your health needs are our priority.  As part of our continuing mission to provide you with exceptional heart care, we have created designated Provider Care Teams.  These Care Teams include your primary Cardiologist (physician) and Advanced Practice Providers (APPs -  Physician Assistants and Nurse Practitioners) who all work together to provide you with the care you need, when you need it. ? ?We recommend signing up for the patient portal called "MyChart".  Sign up information is provided on this After Visit Summary.  MyChart is used to connect with patients for Virtual Visits (Telemedicine).  Patients are able to view lab/test results, encounter notes, upcoming appointments, etc.  Non-urgent messages can be sent to your provider as well.   ?To learn more about what you can do with MyChart, go to [email or web address].   ? ?Your next appointment:   ?6 month(s) ? ?The format for your next appointment:   ?In Person ? ?Provider:   ?You may see Brian Agbor-Etang, MD or one of the following Advanced Practice Providers on your designated Care Team:   ?Christopher Berge, NP ?Ryan Dunn, PA-C ?Cadence Furth, PA-C  ? ? ?Other Instructions ? ? ?

## 2021-07-08 ENCOUNTER — Ambulatory Visit (INDEPENDENT_AMBULATORY_CARE_PROVIDER_SITE_OTHER): Payer: Medicare Other | Admitting: Obstetrics and Gynecology

## 2021-07-08 ENCOUNTER — Encounter: Payer: Self-pay | Admitting: Obstetrics and Gynecology

## 2021-07-08 VITALS — BP 96/60 | Ht 63.0 in | Wt 184.0 lb

## 2021-07-08 DIAGNOSIS — L900 Lichen sclerosus et atrophicus: Secondary | ICD-10-CM

## 2021-07-08 NOTE — Progress Notes (Signed)
? ?Patient ID: Monique Franklin, female   DOB: December 14, 1955, 66 y.o.   MRN: [id number] ? ?Reason for Consult: Follow-up (Feeling better) ?  ?Referred by Lorre Munroe, NP ? ?Subjective:  ?   ?HPI: ? ?Monique Franklin is a 66 y.o. female.  She is following up today regarding lichen sclerosus treatment.  She has been applying clobetasol 2-3 times a week.  She uses Vagisil if she feels the urge to itch.  She reports that she has an improvement of her symptoms after taking yeast medication.  She has recently suffered from some hemorrhoid pain and purchased a sitz bath. ? ?Gynecological History ? ?No LMP recorded. Patient is postmenopausal. ? ?Past Medical History:  ?Diagnosis Date  ? Cardiomyopathy (H[facility])   ? CHF (congestive heart failure) (H[facility])   ? Class I  ? Coronary artery disease   ? Diabetes mellitus without complication ([facility])   ? GERD (gastroesophageal reflux disease)   ? HFrEF (heart failure with reduced ejection fraction) (H[facility])   ? Hyperlipidemia   ? Hypertension   ? MI (myocardial infarction) ([facility])   ? PAD (peripheral artery disease) (H[facility])   ? ?Family History  ?Problem Relation Age of Onset  ? Heart attack Mother   ? COPD Father   ? Thyroid cancer Sister   ? Breast cancer Sister 95  ? Diabetes Sister   ? Diabetes Brother   ? ?Past Surgical History:  ?Procedure Laterality Date  ? BREAST BIOPSY Bilateral 2016  ? neg  ? CARDIAC CATHETERIZATION    ? CARDIAC DEFIBRILLATOR PLACEMENT    ? CATARACT EXTRACTION Bilateral   ? CATARACT EXTRACTION W/PHACO Right 11/09/2020  ? Procedure: CATARACT EXTRACTION PHACO AND INTRAOCULAR LENS PLACEMENT ([facility]) RIGHT VIVITY  DIABETIC;  Surgeon: Galen Manila, MD;  [facility];  Service: Ophthalmology;  Laterality: Right;  2.85 ?00:33.2  ? CATARACT EXTRACTION W/PHACO Left 11/30/2020  ? Procedure: CATARACT EXTRACTION PHACO AND INTRAOCULAR LENS PLACEMENT ([facility]) LEFT DIABETIC VIVITY 3.86 00:28.3;  Surgeon: Galen Manila, MD;  Location: [facility];  Service:  Ophthalmology;  Laterality: Left;  ? COLONOSCOPY WITH PROPOFOL N/A 10/31/2019  ? Procedure: COLONOSCOPY WITH PROPOFOL;  Surgeon: Pasty Spillers, MD;  Location: [facility];  Service: Endoscopy;  Laterality: N/A;  priority 4  ? COMBINED AUGMENTATION MAMMAPLASTY AND ABDOMINOPLASTY    ? CORONARY ARTERY BYPASS GRAFT  08/2017  ? X2 with LIMA to LAD; 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;Severe 3 vessel CAD-PCI to LAD and LC x(5stents) Coronary dissection of LM to LAD   ? CORONARY ARTERY BYPASS GRAFT    ? CORONARY STENT PLACEMENT    ? PERCUTANEOUS CORONARY STENT INTERVENTION (PCI-S)    ? REDUCTION MAMMAPLASTY Bilateral 2017  ? TONSILLECTOMY    ? ? ?Short Social History:  ?Social History  ? ?Tobacco Use  ? Smoking status: Former  ?  Packs/day: 1.00  ?  Years: 33.00  ?  Pack years: 33.00  ?  Types: Cigarettes  ? Smokeless tobacco: Never  ?Substance Use Topics  ? Alcohol use: Yes  ?  Comment: occasional  ? ? ?Allergies  ?Allergen Reactions  ? Ticagrelor Rash  ? ? ?Current Outpatient Medications  ?Medication Sig Dispense Refill  ? aspirin EC 81 MG tablet Take 81 mg by mouth daily.    ? carvedilol (COREG) 25 MG tablet Take 1 tablet (25 mg total) by mouth 2 (two) times daily. 180 tablet 1  ? clobetasol ointment (TEMOVATE) 0.05 % Apply to affected area every night for 4 weeks, then every  other day for 4 weeks and then twice a week for 4 weeks or until resolution. 30 g 5  ? ENTRESTO 97-103 MG TAKE 1 TABLET BY MOUTH  TWICE DAILY 180 tablet 3  ? FARXIGA 10 MG TABS tablet TAKE 1 TABLET BY MOUTH  DAILY 90 tablet 3  ? glimepiride (AMARYL) 4 MG tablet Take 1 tablet (4 mg total) by mouth daily with breakfast. 90 tablet 0  ? icosapent Ethyl (VASCEPA) 1 g capsule Take 2 capsules (2 g total) by mouth 2 (two) times daily. 360 capsule 3  ? metFORMIN (GLUCOPHAGE) 1000 MG tablet TAKE ONE-HALF TABLET BY  MOUTH TWICE DAILY WITH A  MEAL 90 tablet 0  ? Multiple Vitamin (MULTIVITAMIN) tablet Take 1 tablet by mouth daily.    ? oxybutynin  (DITROPAN-XL) 5 MG 24 hr tablet TAKE 1 TABLET(5 MG) BY MOUTH AT BEDTIME 90 tablet 3  ? rosuvastatin (CRESTOR) 40 MG tablet TAKE 1 TABLET BY MOUTH  DAILY AT 6PM 90 tablet 3  ? spironolactone (ALDACTONE) 25 MG tablet Take 1 tablet (25 mg total) by mouth daily. 90 tablet 3  ? ?No current facility-administered medications for this visit.  ? ? ?Review of Systems  ?Constitutional: Negative for chills, fatigue, fever and unexpected weight change.  ?HENT: Negative for trouble swallowing.  ?Eyes: Negative for loss of vision.  ?Respiratory: Negative for cough, shortness of breath and wheezing.  ?Cardiovascular: Negative for chest pain, leg swelling, palpitations and syncope.  ?GI: Negative for abdominal pain, blood in stool, diarrhea, nausea and vomiting.  ?GU: Negative for difficulty urinating, dysuria, frequency and hematuria.  ?Musculoskeletal: Negative for back pain, leg pain and joint pain.  ?Skin: Negative for rash.  ?Neurological: Negative for dizziness, headaches, light-headedness, numbness and seizures.  ?Psychiatric: Negative for behavioral problem, confusion, depressed mood and sleep disturbance.   ? ?   ?Objective:  ?Objective  ? ?Vitals:  ? 07/08/21 1031  ?BP: 96/60  ?Weight: 184 lb (83.5 kg)  ?Height: 5\' 3"  (1.6 m)  ? ?Body mass index is 32.59 kg/m?. ? ?Physical Exam ?Vitals and nursing note reviewed. Exam conducted with a chaperone present.  ?Constitutional:   ?   Appearance: Normal appearance. She is well-developed.  ?HENT:  ?   Head: Normocephalic and atraumatic.  ?Eyes:  ?   Extraocular Movements: Extraocular movements intact.  ?   Pupils: Pupils are equal, round, and reactive to light.  ?Cardiovascular:  ?   Rate and Rhythm: Normal rate and regular rhythm.  ?Pulmonary:  ?   Effort: Pulmonary effort is normal. No respiratory distress.  ?   Breath sounds: Normal breath sounds.  ?Abdominal:  ?   General: Abdomen is flat.  ?   Palpations: Abdomen is soft.  ?Genitourinary: ?   Comments: External: pale pink  vulvar skin, improved, no discharge seen. No concerning lesions.  ?Musculoskeletal:     ?   General: No signs of injury.  ?Skin: ?   General: Skin is warm and dry.  ?Neurological:  ?   Mental Status: She is alert and oriented to person, place, and time.  ?Psychiatric:     ?   Behavior: Behavior normal.     ?   Thought Content: Thought content normal.     ?   Judgment: Judgment normal.  ? ? ?Assessment/Plan:  ?  ? ?66 year old following up for lichen sclerosus ?Continue clobetasol 2 times a week.  Apply Vagisil as needed for itching. ?Follow-up in 3 months for vulvar skin monitoring. ?Establish care  with new gynecologist in office Dr. Ladean Raya. ? ?More than 5 minutes were spent face to face with the patient in the room, reviewing the medical record, labs and images, and coordinating care for the patient. The plan of management was discussed in detail and counseling was provided.  ? ?  ? ?Adelene Idler MD ?[facility], [facility] ?07/08/2021 ?10:44 AM ? ? ?

## 2021-07-25 ENCOUNTER — Ambulatory Visit (INDEPENDENT_AMBULATORY_CARE_PROVIDER_SITE_OTHER): Payer: Medicare Other

## 2021-07-25 DIAGNOSIS — I255 Ischemic cardiomyopathy: Secondary | ICD-10-CM | POA: Diagnosis not present

## 2021-07-25 DIAGNOSIS — I5022 Chronic systolic (congestive) heart failure: Secondary | ICD-10-CM

## 2021-07-27 LAB — CUP PACEART REMOTE DEVICE CHECK
Battery Remaining Longevity: 73 mo
Battery Remaining Percentage: 71 %
Battery Voltage: 2.98 V
Brady Statistic RV Percent Paced: 1 %
Date Time Interrogation Session: 20230417020016
HighPow Impedance: 87 Ohm
HighPow Impedance: 87 Ohm
Implantable Lead Implant Date: 20200707
Implantable Lead Location: 753860
Implantable Pulse Generator Implant Date: 20200707
Lead Channel Impedance Value: 730 Ohm
Lead Channel Pacing Threshold Amplitude: 0.5 V
Lead Channel Pacing Threshold Pulse Width: 0.5 ms
Lead Channel Sensing Intrinsic Amplitude: 11.7 mV
Lead Channel Setting Pacing Amplitude: 2.5 V
Lead Channel Setting Pacing Pulse Width: 0.5 ms
Lead Channel Setting Sensing Sensitivity: 0.5 mV
Pulse Gen Serial Number: 9816687

## 2021-07-29 LAB — HM DIABETES EYE EXAM

## 2021-08-11 NOTE — Progress Notes (Signed)
Remote ICD transmission.   

## 2021-08-23 ENCOUNTER — Other Ambulatory Visit (HOSPITAL_COMMUNITY): Payer: Self-pay | Admitting: Cardiology

## 2021-08-23 ENCOUNTER — Other Ambulatory Visit: Payer: Self-pay | Admitting: Internal Medicine

## 2021-08-24 NOTE — Telephone Encounter (Signed)
Rx 06/21/21 #90-too soon ?Requested Prescriptions  ?Pending Prescriptions Disp Refills  ?? glimepiride (AMARYL) 4 MG tablet [Pharmacy Med Name: Glimepiride 4 MG Oral Tablet] 90 tablet 3  ?  Sig: TAKE 1 TABLET BY MOUTH DAILY  WITH BREAKFAST  ?  ? Endocrinology:  Diabetes - Sulfonylureas Failed - 08/23/2021  7:06 AM  ?  ?  Failed - HBA1C is between 0 and 7.9 and within 180 days  ?  Hgb A1c MFr Bld  ?Date Value Ref Range Status  ?04/25/2021 8.6 (H) <5.7 % of total Hgb Final  ?  Comment:  ?  For someone without known diabetes, a hemoglobin A1c ?value of 6.5% or greater indicates that they may have  ?diabetes and this should be confirmed with a follow-up  ?test. ?. ?For someone with known diabetes, a value <7% indicates  ?that their diabetes is well controlled and a value  ?greater than or equal to 7% indicates suboptimal  ?control. A1c targets should be individualized based on  ?duration of diabetes, age, comorbid conditions, and  ?other considerations. ?. ?Currently, no consensus exists regarding use of ?hemoglobin A1c for diagnosis of diabetes for children. ?. ?  ?   ?  ?  Failed - Cr in normal range and within 360 days  ?  Creat  ?Date Value Ref Range Status  ?04/25/2021 1.09 (H) 0.50 - 1.05 mg/dL Final  ? ?Creatinine, Urine  ?Date Value Ref Range Status  ?04/25/2021 67 20 - 275 mg/dL Final  ?   ?  ?  Passed - Valid encounter within last 6 months  ?  Recent Outpatient Visits   ?      ? 4 months ago Type 2 diabetes mellitus with other specified complication, without long-term current use of insulin (HCC)  ? [facility] [place], [place] W, NP  ? 7 months ago Influenza vaccine administered  ? [facility] [place], [place], NP  ? 10 months ago Type 2 diabetes mellitus with other specified complication, without long-term current use of insulin (HCC)  ? [facility], [place], NP  ?  ?  ?Future Appointments   ?        ? In 4 months Agbor-Etang, Arlys John, MD [facility]  ?  ? ?  ?  ?  ? ?

## 2021-09-30 ENCOUNTER — Other Ambulatory Visit: Payer: Self-pay | Admitting: Internal Medicine

## 2021-09-30 IMAGING — MG DIGITAL SCREENING BILAT W/ TOMO W/ CAD
6 of 10 series · 6 of 30 positions shown · non-contrast
Comparison: Previous exam(s).

ACR Breast Density Category a: The breast tissue is almost entirely
fatty.

CLINICAL DATA: Screening.

EXAM:
DIGITAL SCREENING BILATERAL MAMMOGRAM WITH TOMO AND CAD

[L MLO synth-2D (1 of 2)]
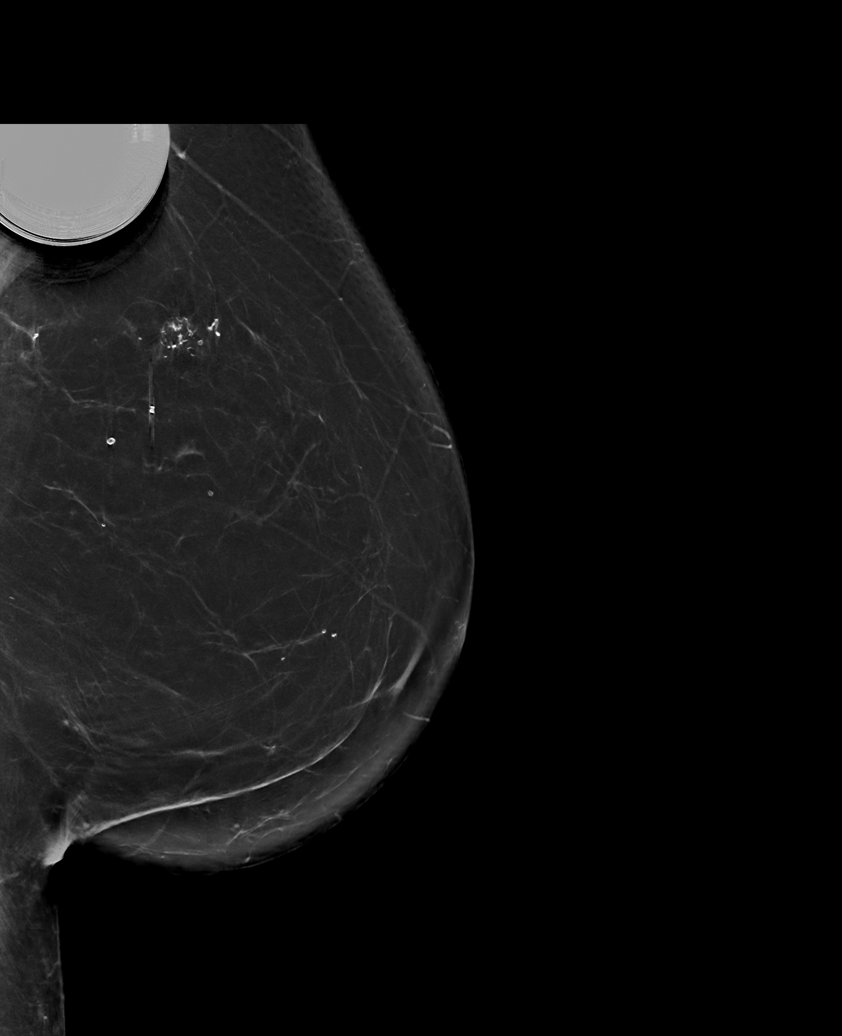

[R CC synth-2D]
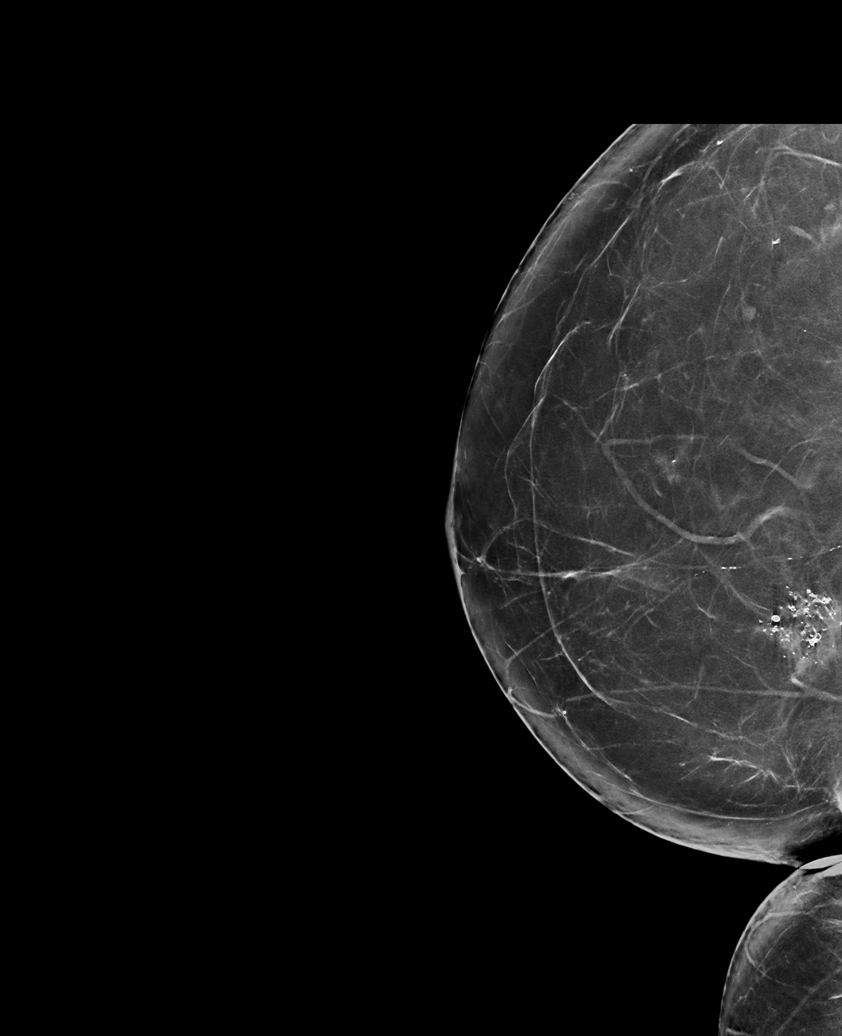

[L MLO synth-2D (2 of 2)]
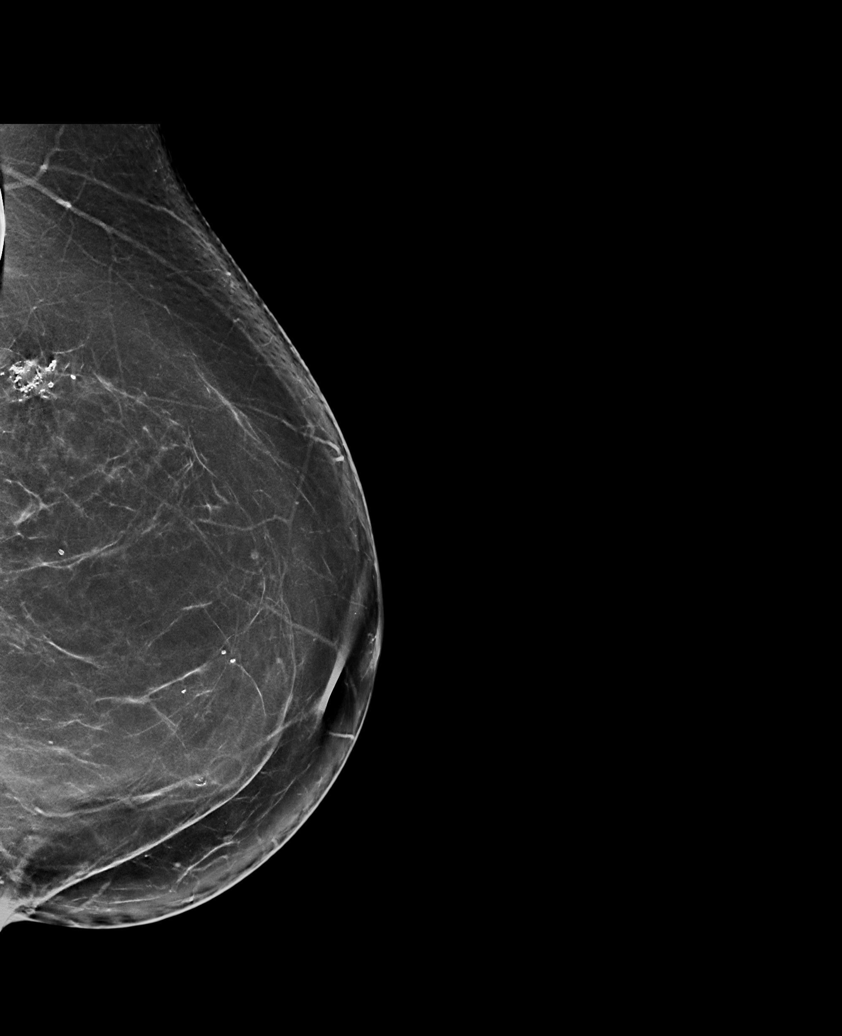

[R MLO synth-2D]
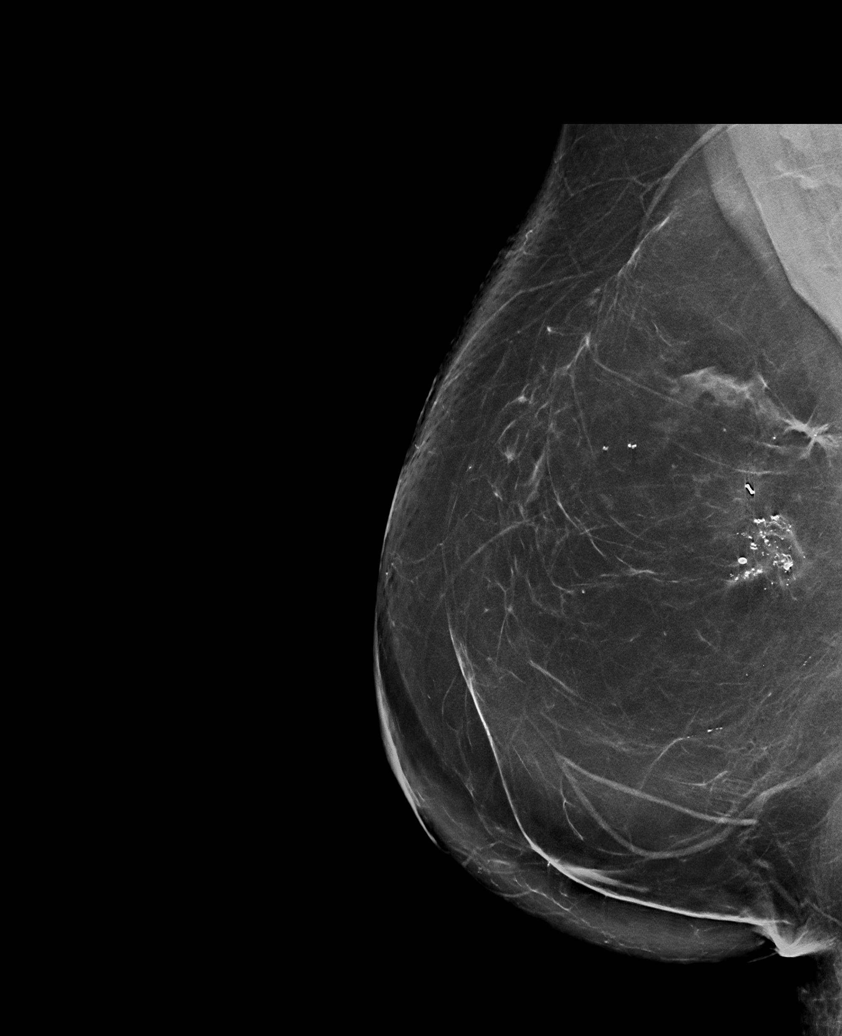

[L CC synth-2D]
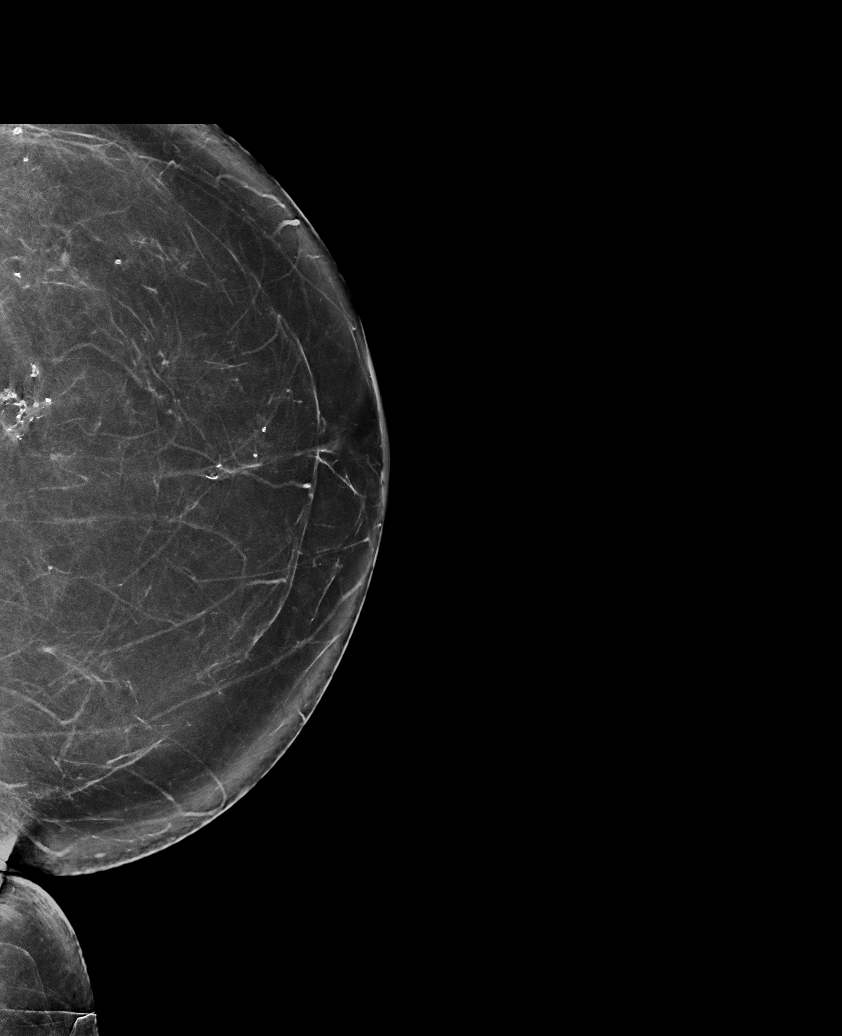

[L CC tomo · tomo slice 45/88.0]
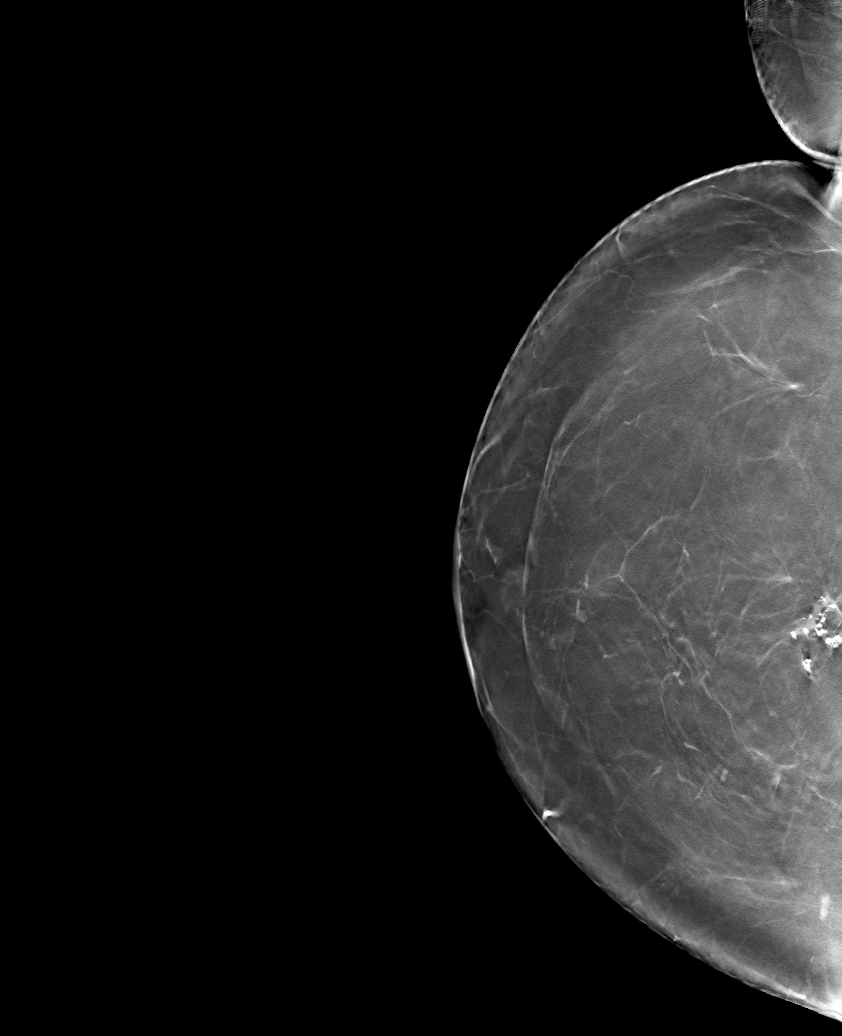

[6 of 30 positions shown; findings below may reference images not displayed]

FINDINGS: There are no findings suspicious for malignancy. Stable benign post
breast reduction surgical changes including areas of fat necrosis
with calcifications. Images were processed with CAD.
IMPRESSION: No mammographic evidence of malignancy. A result letter of this
screening mammogram will be mailed directly to the patient.

RECOMMENDATION:
Screening mammogram in one year. (Code:X2-M-ZDI)

BI-RADS CATEGORY  2: Benign.

## 2021-10-20 ENCOUNTER — Encounter (HOSPITAL_COMMUNITY): Payer: Medicare Other | Admitting: Cardiology

## 2021-10-24 ENCOUNTER — Ambulatory Visit (INDEPENDENT_AMBULATORY_CARE_PROVIDER_SITE_OTHER): Payer: Medicare Other

## 2021-10-24 DIAGNOSIS — I255 Ischemic cardiomyopathy: Secondary | ICD-10-CM

## 2021-10-24 DIAGNOSIS — I5022 Chronic systolic (congestive) heart failure: Secondary | ICD-10-CM

## 2021-10-26 LAB — CUP PACEART REMOTE DEVICE CHECK
Battery Remaining Longevity: 71 mo
Battery Remaining Percentage: 68 %
Battery Voltage: 2.98 V
Brady Statistic RV Percent Paced: 1 %
Date Time Interrogation Session: 20230717020019
HighPow Impedance: 80 Ohm
HighPow Impedance: 80 Ohm
Implantable Lead Implant Date: 20200707
Implantable Lead Location: 753860
Implantable Pulse Generator Implant Date: 20200707
Lead Channel Impedance Value: 680 Ohm
Lead Channel Pacing Threshold Amplitude: 0.5 V
Lead Channel Pacing Threshold Pulse Width: 0.5 ms
Lead Channel Sensing Intrinsic Amplitude: 11.7 mV
Lead Channel Setting Pacing Amplitude: 2.5 V
Lead Channel Setting Pacing Pulse Width: 0.5 ms
Lead Channel Setting Sensing Sensitivity: 0.5 mV
Pulse Gen Serial Number: 9816687

## 2021-11-21 NOTE — Progress Notes (Signed)
Remote ICD transmission.   

## 2021-11-29 ENCOUNTER — Encounter (HOSPITAL_COMMUNITY): Payer: Self-pay | Admitting: Cardiology

## 2021-12-14 ENCOUNTER — Ambulatory Visit (HOSPITAL_COMMUNITY)
Admission: RE | Admit: 2021-12-14 | Discharge: 2021-12-14 | Disposition: A | Discharge status: Home / Self Care | Payer: Medicare Other | Source: Ambulatory Visit | Attending: Cardiology | Admitting: Cardiology

## 2021-12-14 ENCOUNTER — Other Ambulatory Visit (HOSPITAL_COMMUNITY): Payer: Medicare Other

## 2021-12-14 DIAGNOSIS — I5022 Chronic systolic (congestive) heart failure: Secondary | ICD-10-CM | POA: Diagnosis not present

## 2021-12-14 LAB — BASIC METABOLIC PANEL
Anion gap: 9 (ref 5–15)
BUN: 22 mg/dL (ref 8–23)
CO2: 22 mmol/L (ref 22–32)
Calcium: 9.9 mg/dL (ref 8.9–10.3)
Chloride: 109 mmol/L (ref 98–111)
Creatinine, Ser: 1.46 mg/dL — ABNORMAL HIGH (ref 0.44–1.00)
GFR, Estimated: 39 mL/min — ABNORMAL LOW (ref >60)
Glucose, Bld: 247 mg/dL — ABNORMAL HIGH (ref 70–99)
Potassium: 4.4 mmol/L (ref 3.5–5.1)
Sodium: 140 mmol/L (ref 135–145)

## 2021-12-21 ENCOUNTER — Other Ambulatory Visit: Payer: Self-pay | Admitting: Internal Medicine

## 2021-12-22 NOTE — Telephone Encounter (Signed)
Courtesy refill. Called patient and scheduled appt for 12/26/21 Requested Prescriptions  Pending Prescriptions Disp Refills  . metFORMIN (GLUCOPHAGE) 1000 MG tablet [Pharmacy Med Name: metFORMIN HCl 1000 MG Oral Tablet] 90 tablet 0    Sig: TAKE ONE-HALF TABLET BY MOUTH  TWICE DAILY WITH A MEAL     Endocrinology:  Diabetes - Biguanides Failed - 12/21/2021 10:31 PM      Failed - Cr in normal range and within 360 days    Creat  Date Value Ref Range Status  04/25/2021 1.09 (H) 0.50 - 1.05 mg/dL Final   Creatinine, Ser  Date Value Ref Range Status  12/14/2021 1.46 (H) 0.44 - 1.00 mg/dL Final   Creatinine, Urine  Date Value Ref Range Status  04/25/2021 67 20 - 275 mg/dL Final         Failed - HBA1C is between 0 and 7.9 and within 180 days    Hgb A1c MFr Bld  Date Value Ref Range Status  04/25/2021 8.6 (H) <5.7 % of total Hgb Final    Comment:    For someone without known diabetes, a hemoglobin A1c value of 6.5% or greater indicates that they may have  diabetes and this should be confirmed with a follow-up  test. . For someone with known diabetes, a value <7% indicates  that their diabetes is well controlled and a value  greater than or equal to 7% indicates suboptimal  control. A1c targets should be individualized based on  duration of diabetes, age, comorbid conditions, and  other considerations. . Currently, no consensus exists regarding use of hemoglobin A1c for diagnosis of diabetes for children. .          Failed - eGFR in normal range and within 360 days    GFR calc Af Amer  Date Value Ref Range Status  11/03/2019 >60 >60 mL/min Final   GFR, Estimated  Date Value Ref Range Status  12/14/2021 39 (L) >60 mL/min Final    Comment:    (NOTE) Calculated using the CKD-EPI Creatinine Equation (2021)    GFR  Date Value Ref Range Status  05/12/2020 53.53 (L) >60.00 mL/min Final    Comment:    Calculated using the CKD-EPI Creatinine Equation (2021)   eGFR  Date  Value Ref Range Status  04/25/2021 56 (L) > OR = 60 mL/min/1.22m Final    Comment:    The eGFR is based on the CKD-EPI 2021 equation. To calculate  the new eGFR from a previous Creatinine or Cystatin C result, go to https://www.kidney.org/professionals/ kdoqi/gfr%5Fcalculator          Failed - B12 Level in normal range and within 720 days    No results found for: "VITAMINB12"       Failed - Valid encounter within last 6 months    Recent Outpatient Visits          8 months ago Type 2 diabetes mellitus with other specified complication, without long-term current use of insulin [facility]   11 months ago [facility] [place] [place]   1 year ago Type 2 diabetes mellitus with other specified complication, without long-term current use of insulin ([facility]   [facility]      Future Appointments            In [facility] [place] [facility]  In 4 weeks Agbor-Etang, Aaron Edelman, MD [place]           Failed - CBC within normal limits and completed in the last 12 months    WBC  Date Value Ref Range Status  04/25/2021 7.8 3.8 - 10.8 Thousand/uL Final   RBC  Date Value Ref Range Status  04/25/2021 4.23 3.80 - 5.10 Million/uL Final   Hemoglobin  Date Value Ref Range Status  04/25/2021 11.8 11.7 - 15.5 g/dL Final   HCT  Date Value Ref Range Status  04/25/2021 35.9 35.0 - 45.0 % Final   MCHC  Date Value Ref Range Status  04/25/2021 32.9 32.0 - 36.0 g/dL Final   [facility]  Date Value Ref Range Status  04/25/2021 27.9 27.0 - 33.0 pg Final   MCV  Date Value Ref Range Status  04/25/2021 84.9 80.0 - 100.0 fL Final   No results found for: "PLTCOUNTKUC", "LABPLAT", "POCPLA" RDW  Date Value Ref Range Status  04/25/2021 14.6 11.0 - 15.0 % Final

## 2021-12-26 ENCOUNTER — Ambulatory Visit (INDEPENDENT_AMBULATORY_CARE_PROVIDER_SITE_OTHER): Payer: Medicare Other | Admitting: Internal Medicine

## 2021-12-26 ENCOUNTER — Encounter: Payer: Self-pay | Admitting: Internal Medicine

## 2021-12-26 VITALS — BP 112/68 | HR 76 | Temp 96.9°F | Wt 181.0 lb

## 2021-12-26 DIAGNOSIS — Z23 Encounter for immunization: Secondary | ICD-10-CM | POA: Diagnosis not present

## 2021-12-26 DIAGNOSIS — I255 Ischemic cardiomyopathy: Secondary | ICD-10-CM

## 2021-12-26 DIAGNOSIS — E1169 Type 2 diabetes mellitus with other specified complication: Secondary | ICD-10-CM | POA: Diagnosis not present

## 2021-12-26 DIAGNOSIS — Z1211 Encounter for screening for malignant neoplasm of colon: Secondary | ICD-10-CM

## 2021-12-26 DIAGNOSIS — K219 Gastro-esophageal reflux disease without esophagitis: Secondary | ICD-10-CM

## 2021-12-26 DIAGNOSIS — E785 Hyperlipidemia, unspecified: Secondary | ICD-10-CM

## 2021-12-26 DIAGNOSIS — N3281 Overactive bladder: Secondary | ICD-10-CM

## 2021-12-26 DIAGNOSIS — I509 Heart failure, unspecified: Secondary | ICD-10-CM

## 2021-12-26 DIAGNOSIS — I2510 Atherosclerotic heart disease of native coronary artery without angina pectoris: Secondary | ICD-10-CM

## 2021-12-26 DIAGNOSIS — N1832 Chronic kidney disease, stage 3b: Secondary | ICD-10-CM

## 2021-12-26 DIAGNOSIS — I428 Other cardiomyopathies: Secondary | ICD-10-CM

## 2021-12-26 DIAGNOSIS — E6609 Other obesity due to excess calories: Secondary | ICD-10-CM

## 2021-12-26 DIAGNOSIS — Z6832 Body mass index (BMI) 32.0-32.9, adult: Secondary | ICD-10-CM

## 2021-12-26 DIAGNOSIS — I219 Acute myocardial infarction, unspecified: Secondary | ICD-10-CM

## 2021-12-26 DIAGNOSIS — I10 Essential (primary) hypertension: Secondary | ICD-10-CM

## 2021-12-26 DIAGNOSIS — I739 Peripheral vascular disease, unspecified: Secondary | ICD-10-CM

## 2021-12-26 LAB — POCT GLYCOSYLATED HEMOGLOBIN (HGB A1C): Hemoglobin A1C: 9.4 % — AB (ref 4.0–5.6)

## 2021-12-26 MED ORDER — GLIMEPIRIDE 4 MG PO TABS: 4.0000 mg | ORAL_TABLET | Freq: Two times a day (BID) | ORAL | 0 refills | Status: DC

## 2021-12-26 NOTE — Assessment & Plan Note (Signed)
Continue valsartan for renal protection

## 2021-12-26 NOTE — Assessment & Plan Note (Signed)
Continue rosuvastatin, Vascepa, aspirin and carvedilol Lipid profile today 

## 2021-12-26 NOTE — Assessment & Plan Note (Signed)
Compensated Continue carvedilol, Entresto and spironolactone Encouraged her to monitor weights daily Reinforced DASH diet and exercise for weight loss

## 2021-12-26 NOTE — Patient Instructions (Signed)

## 2021-12-26 NOTE — Assessment & Plan Note (Signed)
Continue oxybutynin

## 2021-12-26 NOTE — Assessment & Plan Note (Signed)
Continue carvedilol, Entresto and spironolactone BMET reviewed

## 2021-12-26 NOTE — Assessment & Plan Note (Signed)
Encourage diet and exercise for weight loss 

## 2021-12-26 NOTE — Assessment & Plan Note (Signed)
POCT A1c 9.4% Urine microalbumin checked 04/2021 Continue metformin and Farxiga Increase glimepiride to 4 mg twice daily Reinforced low-carb diet and exercise for weight loss We will request copy of eye exam Encourage routine foot exam Flu shot today Prevnar 20 UTD Encourage her to get her COVID booster

## 2021-12-26 NOTE — Assessment & Plan Note (Signed)
Continue rosuvastatin, Vascepa, aspirin and carvedilol Lipid profile today

## 2021-12-26 NOTE — Assessment & Plan Note (Signed)
Controlled on carvedilol, Entresto and spironolactone BMET reviewed Reinforced DASH diet and exercise for weight loss

## 2021-12-26 NOTE — Assessment & Plan Note (Signed)
No angina Continue rosuvastatin, Vascepa, aspirin and carvedilol Lipid profile today

## 2021-12-26 NOTE — Progress Notes (Signed)
Subjective:    Patient ID: Monique Franklin, female    DOB: April 13, 1955, 66 y.o.   MRN: [id number]  HPI  Patient presents to clinic today for follow-up of chronic conditions.  HLD with CAD status post MI with PAD: Her last LDL was 58, triglycerides 540, 04/2021.  She denies chest or leg pain.  She denies myalgias on Rosuvastatin and Vascepa. She is taking ASA as well.  She has been trying to consume a low-fat diet.  DM2: Her last A1c was 8.6%, 04/2021.  She does not check her sugars.  She is taking Metformin, Glimepiride and Farxiga as prescribed.  She checks her feet routinely.  Her last eye exam was 2022, [place].  Flu 01/2021.  Prevnar 20 10/2020.  COVID Pfizer x3.  CKD 3: Her last creatinine was 1.46, GFR 39, 11/2021.  She is on Valsartan for renal protection.  She does not follow with nephrology.  HTN with Cardiomyopathy: Her BP today is 112/68.  She is taking Carvedilol, Entresto and Spironolactone as prescribed.  ECG from 06/2021 reviewed.  CHF: She denies chronic cough, shortness of breath or lower extremity edema.  She is taking Carvedilol, Entresto and Spironolactone as prescribed.  Echo from 02/2019 reviewed.  GERD: Triggered by tomato based foods, laying down after eating.  She takes Tums as needed with good relief of symptoms. There is no upper GI on file.  OAB: She reports mainly urinary urgency.  She is taking Oxybutynin as prescribed.  She does not follow with urology.  Review of Systems     Past Medical History:  Diagnosis Date   Cardiomyopathy (HCC)    CHF (congestive heart failure) (HCC)    Class I   Coronary artery disease    Diabetes mellitus without complication (HCC)    GERD (gastroesophageal reflux disease)    HFrEF (heart failure with reduced ejection fraction) (HCC)    Hyperlipidemia    Hypertension    MI (myocardial infarction) (HCC)    PAD (peripheral artery disease) (HCC)     Current Outpatient Medications  Medication Sig Dispense Refill   aspirin  EC 81 MG tablet Take 81 mg by mouth daily.     carvedilol (COREG) 25 MG tablet TAKE 1 TABLET BY MOUTH TWICE  DAILY 180 tablet 3   clobetasol ointment (TEMOVATE) 0.05 % Apply to affected area every night for 4 weeks, then every other day for 4 weeks and then twice a week for 4 weeks or until resolution. 30 g 5   ENTRESTO 97-103 MG TAKE 1 TABLET BY MOUTH  TWICE DAILY 180 tablet 3   FARXIGA 10 MG TABS tablet TAKE 1 TABLET BY MOUTH  DAILY 90 tablet 3   glimepiride (AMARYL) 4 MG tablet Take 1 tablet (4 mg total) by mouth daily with breakfast. 90 tablet 0   icosapent Ethyl (VASCEPA) 1 g capsule Take 2 capsules (2 g total) by mouth 2 (two) times daily. 360 capsule 3   metFORMIN (GLUCOPHAGE) 1000 MG tablet TAKE ONE-HALF TABLET BY MOUTH  TWICE DAILY WITH A MEAL 90 tablet 0   Multiple Vitamin (MULTIVITAMIN) tablet Take 1 tablet by mouth daily.     oxybutynin (DITROPAN-XL) 5 MG 24 hr tablet TAKE 1 TABLET(5 MG) BY MOUTH AT BEDTIME 90 tablet 3   rosuvastatin (CRESTOR) 40 MG tablet TAKE 1 TABLET BY MOUTH  DAILY AT 6PM 90 tablet 3   spironolactone (ALDACTONE) 25 MG tablet Take 1 tablet (25 mg total) by mouth daily. 90 tablet 3  No current facility-administered medications for this visit.    Allergies  Allergen Reactions   Ticagrelor Rash    Family History  Problem Relation Age of Onset   Heart attack Mother    COPD Father    Thyroid cancer Sister    Breast cancer Sister 54   Diabetes Sister    Diabetes Brother     Social History   Socioeconomic History   Marital status: Widowed    Spouse name: Not on file   Number of children: Not on file   Years of education: Not on file   Highest education level: Not on file  Occupational History   Not on file  Tobacco Use   Smoking status: Former    Packs/day: 1.00    Years: 33.00    Total pack years: 33.00    Types: Cigarettes   Smokeless tobacco: Never  Vaping Use   Vaping Use: Never used  Substance and Sexual Activity   Alcohol use: Yes     Comment: occasional   Drug use: Never   Sexual activity: Not Currently    Birth control/protection: Post-menopausal  Other Topics Concern   Not on file  Social History Narrative   Not on file   Social Determinants of Health   Financial Resource Strain: Not on file  Food Insecurity: Not on file  Transportation Needs: Not on file  Physical Activity: Not on file  Stress: Not on file  Social Connections: Not on file  Intimate Partner Violence: Not on file     Constitutional: Denies fever, malaise, fatigue, headache or abrupt weight changes.  HEENT: Denies eye pain, eye redness, ear pain, ringing in the ears, wax buildup, runny nose, nasal congestion, bloody nose, or sore throat. Respiratory: Denies difficulty breathing, shortness of breath, cough or sputum production.   Cardiovascular: Denies chest pain, chest tightness, palpitations or swelling in the hands or feet.  Gastrointestinal: Pt reports intermittent reflux. Denies abdominal pain, bloating, constipation, diarrhea or blood in the stool.  GU: Patient reports urinary urgency.  Denies requency, pain with urination, burning sensation, blood in urine, odor or discharge. Musculoskeletal: Denies decrease in range of motion, difficulty with gait, muscle pain or joint pain and swelling.  Skin: Denies redness, rashes, lesions or ulcercations.  Neurological: Denies dizziness, difficulty with memory, difficulty with speech or problems with balance and coordination.  Psych: Denies anxiety, depression, SI/HI.  No other specific complaints in a complete review of systems (except as listed in HPI above).  Objective:   BP 112/68 (BP Location: Left Arm, Patient Position: Sitting, Cuff Size: Normal)   Pulse 76   Temp (!) 96.9 F (36.1 C) (Temporal)   Wt 181 lb (82.1 kg)   SpO2 95%   BMI 32.06 kg/m   Wt Readings from Last 3 Encounters:  07/08/21 184 lb (83.5 kg)  06/23/21 184 lb (83.5 kg)  05/25/21 188 lb (85.3 kg)    General:  Appears her stated age, obese in NAD. Skin: Warm, dry and intact. No ulcerations noted. HEENT: Head: normal shape and size; Eyes: sclera white, no icterus, conjunctiva pink, PERRLA and EOMs intact;  Cardiovascular: Normal rate and rhythm. S1,S2 noted.  No murmur, rubs or gallops noted. No JVD or BLE edema. No carotid bruits noted. Pulmonary/Chest: Normal effort and positive vesicular breath sounds. No respiratory distress. No wheezes, rales or ronchi noted.  Abdomen: Normal bowel sound Musculoskeletal: No difficulty with gait.  Neurological: Alert and oriented.  Psychiatric: Mood and affect normal. Behavior is normal.  Judgment and thought content normal.     BMET    Component Value Date/Time   NA 140 12/14/2021 1001   NA 139 06/12/2019 0840   K 4.4 12/14/2021 1001   CL 109 12/14/2021 1001   CO2 22 12/14/2021 1001   GLUCOSE 247 (H) 12/14/2021 1001   BUN 22 12/14/2021 1001   BUN 28 (H) 06/12/2019 0840   CREATININE 1.46 (H) 12/14/2021 1001   CREATININE 1.09 (H) 04/25/2021 0920   CALCIUM 9.9 12/14/2021 1001   GFRNONAA 39 (L) 12/14/2021 1001   GFRAA >60 11/03/2019 1139    Lipid Panel     Component Value Date/Time   CHOL 141 04/25/2021 0920   TRIG 210 (H) 04/25/2021 0920   HDL 55 04/25/2021 0920   CHOLHDL 2.6 04/25/2021 0920   VLDL 53 (H) 08/31/2020 0922   LDLCALC 58 04/25/2021 0920    CBC    Component Value Date/Time   WBC 7.8 04/25/2021 0920   RBC 4.23 04/25/2021 0920   HGB 11.8 04/25/2021 0920   HCT 35.9 04/25/2021 0920   PLT 232 04/25/2021 0920   MCV 84.9 04/25/2021 0920   MCH 27.9 04/25/2021 0920   MCHC 32.9 04/25/2021 0920   RDW 14.6 04/25/2021 0920    Hgb A1C Lab Results  Component Value Date   HGBA1C 8.6 (H) 04/25/2021           Assessment & Plan:     RTC in 3 months, follow-up chronic conditions Webb Silversmith, NP

## 2021-12-26 NOTE — Assessment & Plan Note (Signed)
Try to avoid foods that trigger reflux Avoid laying down after eating Encourage weight loss as this can help reduce reflux symptoms Okay to continue Tums OTC as needed

## 2021-12-27 LAB — LIPID PANEL
Cholesterol: 129 mg/dL (ref <200)
HDL: 52 mg/dL (ref >=50)
LDL Cholesterol (Calc): 51 mg/dL (calc)
Non-HDL Cholesterol (Calc): 77 mg/dL (calc) (ref <130)
Total CHOL/HDL Ratio: 2.5 (calc) (ref <5.0)
Triglycerides: 188 mg/dL — ABNORMAL HIGH (ref <150)

## 2021-12-29 ENCOUNTER — Telehealth: Payer: Self-pay

## 2021-12-29 ENCOUNTER — Other Ambulatory Visit: Payer: Self-pay

## 2021-12-29 ENCOUNTER — Telehealth: Payer: Self-pay | Admitting: Cardiology

## 2021-12-29 DIAGNOSIS — Z1211 Encounter for screening for malignant neoplasm of colon: Secondary | ICD-10-CM

## 2021-12-29 MED ORDER — NA SULFATE-K SULFATE-MG SULF 17.5-3.13-1.6 GM/177ML PO SOLN: 2.0000 | Freq: Once | ORAL | 0 refills | Status: AC

## 2021-12-29 NOTE — Telephone Encounter (Signed)
Called pt to make a tele visit for surgical clearance, per pt, she is seeing Dr. Aundra Dubin on Monday and wants to know can't he clear her at that time.

## 2021-12-29 NOTE — Telephone Encounter (Signed)
   Name: Diamonique Ruedas  DOB: [birth year]  MRN: [id number]  Primary Cardiologist: Kate Sable, MD  Chart reviewed as part of pre-operative protocol coverage. Because of Zakariah Dejarnette past medical history and time since last visit, she will require a follow-up telephone visit in order to better assess preoperative cardiovascular risk.  Pre-op covering staff: - Please schedule appointment and call patient to inform them. If patient already had an upcoming appointment within acceptable timeframe, please add "pre-op clearance" to the appointment notes so provider is aware. - Please contact requesting surgeon's office via preferred method (i.e, phone, fax) to inform them of need for appointment prior to surgery.  No medications indicated as needing held  Elgie Collard, [place]  12/29/2021, 4:18 PM

## 2021-12-29 NOTE — Telephone Encounter (Signed)
   Pre-operative Risk Assessment    Patient Name: Monique Franklin  DOB: 1955/11/18 MRN: [id number]      Request for Surgical Clearance    Procedure:   colonoscopy   Date of Surgery:  Clearance 01/19/22                                 Surgeon:  not indicated Surgeon's Group or Practice Name:  [facility] Phone number:  [phone] Fax number:  [phone]   Type of Clearance Requested:   - Medical    Type of Anesthesia:  General    Additional requests/questions:    [name]   12/29/2021, 2:33 PM

## 2021-12-29 NOTE — Telephone Encounter (Signed)
Gastroenterology Pre-Procedure Review  Request Date: 01/19/22 Requesting Physician: Dr. Vicente Males  PATIENT REVIEW QUESTIONS: The patient responded to the following health history questions as indicated:    1. Are you having any GI issues? no GI issues. Last colonoscopy 10/31/19 performed by Dr. Bonna Gains. She recommended repeat colonoscopy with a 2 day prep. No polyps were noted.  2. Do you have a personal history of Polyps? no 3. Do you have a family history of Colon Cancer or Polyps? no 4. Diabetes Mellitus? Yes patient advised to hold glimeperide 1 day prior to colonoscopy, hold metformin 2 days prior to colonoscopy 5. Joint replacements in the past 12 months?no 6. Major health problems in the past 3 months?no 7. Any artificial heart valves, MVP, or defibrillator?yes (defibrilator Dr. Garen Lah Cardiologist clearance sent)    MEDICATIONS & ALLERGIES:    Patient reports the following regarding taking any anticoagulation/antiplatelet therapy:   Plavix, Coumadin, Eliquis, Xarelto, Lovenox, Pradaxa, Brilinta, or Effient? no Aspirin? yes (81 mg daily)  Patient confirms/reports the following medications:  Current Outpatient Medications  Medication Sig Dispense Refill   aspirin EC 81 MG tablet Take 81 mg by mouth daily.     carvedilol (COREG) 25 MG tablet TAKE 1 TABLET BY MOUTH TWICE  DAILY 180 tablet 3   clobetasol ointment (TEMOVATE) 0.05 % Apply to affected area every night for 4 weeks, then every other day for 4 weeks and then twice a week for 4 weeks or until resolution. 30 g 5   ENTRESTO 97-103 MG TAKE 1 TABLET BY MOUTH  TWICE DAILY 180 tablet 3   FARXIGA 10 MG TABS tablet TAKE 1 TABLET BY MOUTH  DAILY 90 tablet 3   glimepiride (AMARYL) 4 MG tablet Take 1 tablet (4 mg total) by mouth in the morning and at bedtime. 180 tablet 0   icosapent Ethyl (VASCEPA) 1 g capsule Take 2 capsules (2 g total) by mouth 2 (two) times daily. 360 capsule 3   metFORMIN (GLUCOPHAGE) 1000 MG tablet TAKE  ONE-HALF TABLET BY MOUTH  TWICE DAILY WITH A MEAL 90 tablet 0   Multiple Vitamin (MULTIVITAMIN) tablet Take 1 tablet by mouth daily.     oxybutynin (DITROPAN-XL) 5 MG 24 hr tablet TAKE 1 TABLET(5 MG) BY MOUTH AT BEDTIME 90 tablet 3   rosuvastatin (CRESTOR) 40 MG tablet TAKE 1 TABLET BY MOUTH  DAILY AT 6PM 90 tablet 3   spironolactone (ALDACTONE) 25 MG tablet Take 1 tablet (25 mg total) by mouth daily. 90 tablet 3   No current facility-administered medications for this visit.    Patient confirms/reports the following allergies:  Allergies  Allergen Reactions   Ticagrelor Rash    No orders of the defined types were placed in this encounter.   AUTHORIZATION INFORMATION Primary Insurance: 1D#: Group #:  Secondary Insurance: 1D#: Group #:  SCHEDULE INFORMATION: Date: 01/19/22 Time: Location: [facility]

## 2022-01-02 ENCOUNTER — Ambulatory Visit: Payer: Medicare Other | Admitting: Cardiology

## 2022-01-02 ENCOUNTER — Encounter (HOSPITAL_COMMUNITY): Payer: Self-pay | Admitting: Cardiology

## 2022-01-02 ENCOUNTER — Ambulatory Visit (HOSPITAL_COMMUNITY)
Admission: RE | Admit: 2022-01-02 | Discharge: 2022-01-02 | Disposition: A | Discharge status: Home / Self Care | Payer: Medicare Other | Source: Ambulatory Visit | Attending: Cardiology | Admitting: Cardiology

## 2022-01-02 VITALS — BP 130/80 | HR 74 | Wt 180.8 lb

## 2022-01-02 DIAGNOSIS — I2510 Atherosclerotic heart disease of native coronary artery without angina pectoris: Secondary | ICD-10-CM | POA: Insufficient documentation

## 2022-01-02 DIAGNOSIS — Z7982 Long term (current) use of aspirin: Secondary | ICD-10-CM | POA: Insufficient documentation

## 2022-01-02 DIAGNOSIS — Z7984 Long term (current) use of oral hypoglycemic drugs: Secondary | ICD-10-CM | POA: Insufficient documentation

## 2022-01-02 DIAGNOSIS — Z951 Presence of aortocoronary bypass graft: Secondary | ICD-10-CM | POA: Diagnosis not present

## 2022-01-02 DIAGNOSIS — E1122 Type 2 diabetes mellitus with diabetic chronic kidney disease: Secondary | ICD-10-CM | POA: Diagnosis not present

## 2022-01-02 DIAGNOSIS — Z95810 Presence of automatic (implantable) cardiac defibrillator: Secondary | ICD-10-CM | POA: Insufficient documentation

## 2022-01-02 DIAGNOSIS — I130 Hypertensive heart and chronic kidney disease with heart failure and stage 1 through stage 4 chronic kidney disease, or unspecified chronic kidney disease: Secondary | ICD-10-CM | POA: Diagnosis not present

## 2022-01-02 DIAGNOSIS — I5022 Chronic systolic (congestive) heart failure: Secondary | ICD-10-CM | POA: Insufficient documentation

## 2022-01-02 DIAGNOSIS — I252 Old myocardial infarction: Secondary | ICD-10-CM | POA: Diagnosis not present

## 2022-01-02 DIAGNOSIS — Z955 Presence of coronary angioplasty implant and graft: Secondary | ICD-10-CM | POA: Insufficient documentation

## 2022-01-02 DIAGNOSIS — I255 Ischemic cardiomyopathy: Secondary | ICD-10-CM | POA: Insufficient documentation

## 2022-01-02 DIAGNOSIS — N183 Chronic kidney disease, stage 3 unspecified: Secondary | ICD-10-CM | POA: Diagnosis not present

## 2022-01-02 DIAGNOSIS — Z79899 Other long term (current) drug therapy: Secondary | ICD-10-CM | POA: Diagnosis not present

## 2022-01-02 DIAGNOSIS — E785 Hyperlipidemia, unspecified: Secondary | ICD-10-CM | POA: Diagnosis not present

## 2022-01-02 DIAGNOSIS — Z8249 Family history of ischemic heart disease and other diseases of the circulatory system: Secondary | ICD-10-CM | POA: Insufficient documentation

## 2022-01-02 LAB — BASIC METABOLIC PANEL
Anion gap: 8 (ref 5–15)
BUN: 28 mg/dL — ABNORMAL HIGH (ref 8–23)
CO2: 23 mmol/L (ref 22–32)
Calcium: 9.5 mg/dL (ref 8.9–10.3)
Chloride: 106 mmol/L (ref 98–111)
Creatinine, Ser: 1.56 mg/dL — ABNORMAL HIGH (ref 0.44–1.00)
GFR, Estimated: 36 mL/min — ABNORMAL LOW (ref >60)
Glucose, Bld: 412 mg/dL — ABNORMAL HIGH (ref 70–99)
Potassium: 5.1 mmol/L (ref 3.5–5.1)
Sodium: 137 mmol/L (ref 135–145)

## 2022-01-02 NOTE — Patient Instructions (Signed)
There has been no changes to your medications.  Labs done today, your results will be available in MyChart, we will contact you for abnormal readings.  Your physician has requested that you have an echocardiogram. Echocardiography is a painless test that uses sound waves to create images of your heart. It provides your doctor with information about the size and shape of your heart and how well your heart's chambers and valves are working. This procedure takes approximately one hour. There are no restrictions for this procedure.  Your physician recommends that you schedule a follow-up appointment in: 4 months with an echocardiogram (January 2024)  ** please call the office in November to arrange your follow up appointment **  If you have any questions or concerns before your next appointment please send [place] a message through [place] or call our office at [phone].    TO LEAVE A MESSAGE FOR THE NURSE SELECT OPTION 2, PLEASE LEAVE A MESSAGE INCLUDING: YOUR NAME DATE OF BIRTH CALL BACK NUMBER REASON FOR CALL**this is important as we prioritize the call backs  YOU WILL RECEIVE A CALL BACK THE SAME DAY AS LONG AS YOU CALL BEFORE 4:00 PM  At the [facility], you and your health needs are our priority. As part of our continuing mission to provide you with exceptional heart care, we have created designated Provider Care Teams. These Care Teams include your primary Cardiologist (physician) and Advanced Practice Providers (APPs- Physician Assistants and Nurse Practitioners) who all work together to provide you with the care you need, when you need it.   You may see any of the following providers on your designated Care Team at your next follow up: Dr Glori Bickers Dr Loralie Champagne Dr. Roxana Hires, NP Lyda Jester, [place] [facility] [place], [place] Forestine Na, NP Audry Riles, PharmD   Please be sure to bring in all your medications bottles to every  appointment.

## 2022-01-02 NOTE — Progress Notes (Addendum)
PCP: Jearld Fenton, NP  Cardiology: Dr. Garen Lah HF Cardiology: Dr. Aundra Dubin  66 y.o. with CAD and ischemic cardiomyopathy was referred by Dr. Garen Lah for evaluation of CHF.  She lived in [place] in the past and had her initial cardiology care there.  In 4/19, she had PCI to LAD and LCx with 5 stents. She then had an MI in 5/19 and had CABG with LIMA-LAD and SVG-OM.  She developed an ischemic cardiomyopathy and had a St Jude ICD placed.  She moved to [facility] and has been followed there more recently.  Echo in 10/20 showed EF 30-35%, mild LV dilation, mild-moderate MR.  Echo in 10/21 showed EF 35-40%.   Echo in 11/22 showed EF 35-40%, mildly decreased RV systolic function.   She returns for followup of CHF.  She has been doing well symptomatically.  Weight down 4 lbs. She is not exercising much but denies exertional dyspnea.  She is fatigued after walking about 1/2 mile.  No dypsnea walking around the house.  Ok walking in stores.  No chest pain. No lightheadedness.    St Jude device interrogation: Stable thoracic impedance, no VT.    ECG (personally reviewed): NSR, inferior Qs, poor RWP  Labs (1/21): K 4.5, creatinine 1.09, LDL 54, TGs 196 Labs (4/21): K 4.2, creatinine 0.94 Labs (7/21): K 4.5, creatinine 0.96, LDL 56, HDl 57 Labs (10/21) K 4.5, creatinine 1.02 Labs (2/22): creatinine 1.09, LDL 59, TGs 301 Labs (10/22): K 4, creatinine 1.24, LDL 55, TGs 237 Labs (9/23): K 4.4, creatinine 1.46, LDL 51, TGs 188  PMH: 1. HTN 2. Type 2 diabetes 3. CAD: PCI to LAD and LCx in 4/19, 5 stents altogether.  - MI in 5/19 with CABG (LIMA-LAD, SVG-OM).  4. Hyperlipidemia 5. Chronic systolic CHF: Ischemic cardiomyopathy.   - St Jude ICD.  - Echo (10/20): EF 30-35%, mildly dilated LV, normal RV, mild to moderate MR.  - Echo (10/21): EF 35-40%, diffuse hypokinesis, normal RV - Echo (11/22): EF 35-40%, mildly decreased RV systolic function. 6. Left femoral thrombendarterectomy with patch  angioplasty in 9/19.  Complication of Impella.   FH: Mother with CABG in 6s, MI in 58s.   SH: Widowed, lives in [place], nonsmoker. She has children locally.  Not working.   ROS: All systems reviewed and negative except as per HPI.   Current Outpatient Medications  Medication Sig Dispense Refill   aspirin EC 81 MG tablet Take 81 mg by mouth daily.     carvedilol (COREG) 25 MG tablet TAKE 1 TABLET BY MOUTH TWICE  DAILY 180 tablet 3   clobetasol ointment (TEMOVATE) 0.05 % Apply to affected area every night for 4 weeks, then every other day for 4 weeks and then twice a week for 4 weeks or until resolution. 30 g 5   ENTRESTO 97-103 MG TAKE 1 TABLET BY MOUTH  TWICE DAILY 180 tablet 3   FARXIGA 10 MG TABS tablet TAKE 1 TABLET BY MOUTH  DAILY 90 tablet 3   glimepiride (AMARYL) 4 MG tablet Take 1 tablet (4 mg total) by mouth in the morning and at bedtime. 180 tablet 0   icosapent Ethyl (VASCEPA) 1 g capsule Take 2 capsules (2 g total) by mouth 2 (two) times daily. 360 capsule 3   metFORMIN (GLUCOPHAGE) 1000 MG tablet TAKE ONE-HALF TABLET BY MOUTH  TWICE DAILY WITH A MEAL 90 tablet 0   Multiple Vitamin (MULTIVITAMIN) tablet Take 1 tablet by mouth daily.     oxybutynin (DITROPAN-XL) 5  MG 24 hr tablet TAKE 1 TABLET(5 MG) BY MOUTH AT BEDTIME 90 tablet 3   rosuvastatin (CRESTOR) 40 MG tablet TAKE 1 TABLET BY MOUTH  DAILY AT 6PM 90 tablet 3   spironolactone (ALDACTONE) 25 MG tablet Take 1 tablet (25 mg total) by mouth daily. 90 tablet 3   No current facility-administered medications for this encounter.   BP 130/80   Pulse 74   Wt 82 kg (180 lb 12.8 oz)   SpO2 97%   BMI 32.03 kg/m  General: NAD Neck: No JVD, no thyromegaly or thyroid nodule.  Lungs: Clear to auscultation bilaterally with normal respiratory effort. CV: Nondisplaced PMI.  Heart regular S1/S2, no S3/S4, no murmur.  No peripheral edema.  No carotid bruit.  Normal pedal pulses.  Abdomen: Soft, nontender, no hepatosplenomegaly, no  distention.  Skin: Intact without lesions or rashes.  Neurologic: Alert and oriented x 3.  Psych: Normal affect. Extremities: No clubbing or cyanosis.  HEENT: Normal.   Assessment/Plan: 1. Chronic systolic CHF: Ischemic cardiomyopathy.  St Jude ICD.  Echo (10/20) with EF 30-35%, mild-moderate MR.  Echo in 10/21 and again in 11/22 with EF 35-40%.   She is not volume overloaded by exam.  NYHA class II symptoms.   - Continue spironolactone 25 mg daily.  BMET today.  - Continue Coreg 25 mg bid. - Continue Entresto 97/103 bid    - Has not needed Lasix.  - Continue dapagliflozin 10 mg daily.  - I will arrange for repeat echo at followup.  2. CAD: s/p CABG, no chest pain.  - Continue ASA 81 daily.  - Continue Crestor 40 daily, good LDL in 9/23.  3. Hyperlipidemia: She is on Crestor and Vascepa, lipids ok in 9/23.  4. CKD stage 3: Creatinine has trended up.  Will get BMET today.  5. She needs a colonoscopy.  She should be stable for the procedure.   Echo + followup in 4 months.   Loralie Champagne 01/02/2022

## 2022-01-02 NOTE — Addendum Note (Signed)
Encounter addended by: Larey Dresser, MD on: 01/02/2022 11:51 PM  Actions taken: Clinical Note Signed

## 2022-01-04 ENCOUNTER — Telehealth (HOSPITAL_COMMUNITY): Payer: Self-pay | Admitting: Surgery

## 2022-01-04 DIAGNOSIS — I5022 Chronic systolic (congestive) heart failure: Secondary | ICD-10-CM

## 2022-01-04 NOTE — Telephone Encounter (Signed)
-----   Message from Larey Dresser, MD sent at 01/02/2022  4:06 PM EDT ----- BUN/creatinine is high.  Increase po fluid intake, repeat BMET 1 week.

## 2022-01-04 NOTE — Telephone Encounter (Signed)
I called patient to review results and recommendations per provider.  She will return for labwork next week.

## 2022-01-11 ENCOUNTER — Ambulatory Visit (HOSPITAL_COMMUNITY)
Admission: RE | Admit: 2022-01-11 | Discharge: 2022-01-11 | Disposition: A | Discharge status: Home / Self Care | Payer: Medicare Other | Source: Ambulatory Visit | Attending: Internal Medicine | Admitting: Internal Medicine

## 2022-01-11 DIAGNOSIS — I5022 Chronic systolic (congestive) heart failure: Secondary | ICD-10-CM | POA: Insufficient documentation

## 2022-01-11 LAB — BASIC METABOLIC PANEL
Anion gap: 9 (ref 5–15)
BUN: 27 mg/dL — ABNORMAL HIGH (ref 8–23)
CO2: 24 mmol/L (ref 22–32)
Calcium: 9.8 mg/dL (ref 8.9–10.3)
Chloride: 104 mmol/L (ref 98–111)
Creatinine, Ser: 1.67 mg/dL — ABNORMAL HIGH (ref 0.44–1.00)
GFR, Estimated: 34 mL/min — ABNORMAL LOW (ref >60)
Glucose, Bld: 198 mg/dL — ABNORMAL HIGH (ref 70–99)
Potassium: 4.3 mmol/L (ref 3.5–5.1)
Sodium: 137 mmol/L (ref 135–145)

## 2022-01-17 ENCOUNTER — Telehealth: Payer: Self-pay

## 2022-01-17 NOTE — Telephone Encounter (Signed)
Patient has been cleared for her colonoscopy per 01/02/22 Office Visit with Dr. Loralie Champagne.  He noted during this visit "She needs a colonoscopy.  She should be stable for the procedure".   Thanks,  [place], [place]

## 2022-01-19 ENCOUNTER — Ambulatory Visit: Payer: Medicare Other | Admitting: Anesthesiology

## 2022-01-19 ENCOUNTER — Ambulatory Visit
Admission: RE | Admit: 2022-01-19 | Discharge: 2022-01-19 | Disposition: A | Discharge status: Home / Self Care | Payer: Medicare Other | Attending: Gastroenterology | Admitting: Gastroenterology

## 2022-01-19 ENCOUNTER — Other Ambulatory Visit: Payer: Self-pay

## 2022-01-19 ENCOUNTER — Encounter: Payer: Self-pay | Admitting: Gastroenterology

## 2022-01-19 ENCOUNTER — Encounter
Admission: RE | Disposition: A | Discharge status: Home / Self Care | Payer: Self-pay | Source: Home / Self Care | Attending: Gastroenterology

## 2022-01-19 DIAGNOSIS — K219 Gastro-esophageal reflux disease without esophagitis: Secondary | ICD-10-CM | POA: Diagnosis not present

## 2022-01-19 DIAGNOSIS — I5022 Chronic systolic (congestive) heart failure: Secondary | ICD-10-CM | POA: Diagnosis not present

## 2022-01-19 DIAGNOSIS — E669 Obesity, unspecified: Secondary | ICD-10-CM | POA: Diagnosis not present

## 2022-01-19 DIAGNOSIS — N189 Chronic kidney disease, unspecified: Secondary | ICD-10-CM | POA: Diagnosis not present

## 2022-01-19 DIAGNOSIS — E785 Hyperlipidemia, unspecified: Secondary | ICD-10-CM | POA: Insufficient documentation

## 2022-01-19 DIAGNOSIS — Z6831 Body mass index (BMI) 31.0-31.9, adult: Secondary | ICD-10-CM | POA: Insufficient documentation

## 2022-01-19 DIAGNOSIS — Z95810 Presence of automatic (implantable) cardiac defibrillator: Secondary | ICD-10-CM | POA: Insufficient documentation

## 2022-01-19 DIAGNOSIS — Z79899 Other long term (current) drug therapy: Secondary | ICD-10-CM | POA: Diagnosis not present

## 2022-01-19 DIAGNOSIS — Z7984 Long term (current) use of oral hypoglycemic drugs: Secondary | ICD-10-CM | POA: Diagnosis not present

## 2022-01-19 DIAGNOSIS — Z955 Presence of coronary angioplasty implant and graft: Secondary | ICD-10-CM | POA: Diagnosis not present

## 2022-01-19 DIAGNOSIS — I2510 Atherosclerotic heart disease of native coronary artery without angina pectoris: Secondary | ICD-10-CM | POA: Diagnosis not present

## 2022-01-19 DIAGNOSIS — K5730 Diverticulosis of large intestine without perforation or abscess without bleeding: Secondary | ICD-10-CM | POA: Insufficient documentation

## 2022-01-19 DIAGNOSIS — Z951 Presence of aortocoronary bypass graft: Secondary | ICD-10-CM | POA: Insufficient documentation

## 2022-01-19 DIAGNOSIS — E1122 Type 2 diabetes mellitus with diabetic chronic kidney disease: Secondary | ICD-10-CM | POA: Diagnosis not present

## 2022-01-19 DIAGNOSIS — I130 Hypertensive heart and chronic kidney disease with heart failure and stage 1 through stage 4 chronic kidney disease, or unspecified chronic kidney disease: Secondary | ICD-10-CM | POA: Diagnosis not present

## 2022-01-19 DIAGNOSIS — I252 Old myocardial infarction: Secondary | ICD-10-CM | POA: Diagnosis not present

## 2022-01-19 DIAGNOSIS — Z87891 Personal history of nicotine dependence: Secondary | ICD-10-CM | POA: Diagnosis not present

## 2022-01-19 DIAGNOSIS — E1151 Type 2 diabetes mellitus with diabetic peripheral angiopathy without gangrene: Secondary | ICD-10-CM | POA: Diagnosis not present

## 2022-01-19 DIAGNOSIS — Z1211 Encounter for screening for malignant neoplasm of colon: Secondary | ICD-10-CM

## 2022-01-19 HISTORY — PX: COLONOSCOPY WITH PROPOFOL: SHX5780

## 2022-01-19 LAB — GLUCOSE, CAPILLARY: Glucose-Capillary: 164 mg/dL — ABNORMAL HIGH (ref 70–99)

## 2022-01-19 SURGERY — COLONOSCOPY WITH PROPOFOL
Anesthesia: General

## 2022-01-19 MED ORDER — PHENYLEPHRINE HCL (PRESSORS) 10 MG/ML IV SOLN
INTRAVENOUS | Status: DC | PRN
  Administered 2022-01-19 (×2): 80 ug via INTRAVENOUS

## 2022-01-19 MED ORDER — SODIUM CHLORIDE 0.9 % IV SOLN: INTRAVENOUS | Status: DC

## 2022-01-19 MED ORDER — PROPOFOL 500 MG/50ML IV EMUL
INTRAVENOUS | Status: DC | PRN
  Administered 2022-01-19: 125 ug/kg/min via INTRAVENOUS

## 2022-01-19 MED ORDER — PHENYLEPHRINE 80 MCG/ML (10ML) SYRINGE FOR IV PUSH (FOR BLOOD PRESSURE SUPPORT)
PREFILLED_SYRINGE | INTRAVENOUS | Status: AC
  Filled 2022-01-19: qty 10

## 2022-01-19 MED ORDER — LIDOCAINE HCL (CARDIAC) PF 100 MG/5ML IV SOSY
PREFILLED_SYRINGE | INTRAVENOUS | Status: DC | PRN
  Administered 2022-01-19: 50 mg via INTRAVENOUS

## 2022-01-19 MED ORDER — PROPOFOL 10 MG/ML IV BOLUS
INTRAVENOUS | Status: DC | PRN
  Administered 2022-01-19: 30 mg via INTRAVENOUS

## 2022-01-19 NOTE — Anesthesia Postprocedure Evaluation (Signed)
Anesthesia Post Note  Patient: Monique Franklin  Procedure(s) Performed: COLONOSCOPY WITH PROPOFOL  Patient location during evaluation: Endoscopy Anesthesia Type: General Level of consciousness: awake and alert Pain management: pain level controlled Vital Signs Assessment: post-procedure vital signs reviewed and stable Respiratory status: spontaneous breathing, nonlabored ventilation and respiratory function stable Cardiovascular status: blood pressure returned to baseline and stable Postop Assessment: no apparent nausea or vomiting Anesthetic complications: no   No notable events documented.   Last Vitals:  Vitals:   01/19/22 0854 01/19/22 0855  BP: 90/60 (!) 87/58  Pulse: 80 88  Resp: 16 14  Temp: (!) 36.2 C   SpO2: 98% 96%    Last Pain:  Vitals:   01/19/22 0855  TempSrc:   PainSc: 0-No pain                 [place] Ouch

## 2022-01-19 NOTE — Anesthesia Preprocedure Evaluation (Addendum)
Anesthesia Evaluation  Patient identified by MRN, date of birth, ID band Patient awake    Reviewed: Allergy & Precautions, H&P , NPO status , Patient's Chart, lab work & pertinent test results, reviewed documented beta blocker date and time   Airway Mallampati: III  TM Distance: >3 FB Neck ROM: full    Dental no notable dental hx.    Pulmonary former smoker,    Pulmonary exam normal breath sounds clear to auscultation       Cardiovascular Exercise Tolerance: Good hypertension, + CAD ( PCI to LAD and LCx in 4/19, 5 stents altogether), + Past MI, + Cardiac Stents, + CABG, + Peripheral Vascular Disease and +CHF  Normal cardiovascular exam+ Cardiac Defibrillator (St Jude device interrogation: Stable thoracic impedance, no VT)  Rhythm:regular Rate:Normal  Echo in 11/22 showed EF 35-40%, mildly decreased RV systolic function.   Left femoral thrombendarterectomy with patch angioplasty in 9/19.  Complication of Impella  NYHA class II symptoms.   Neuro/Psych negative neurological ROS  negative psych ROS   GI/Hepatic Neg liver ROS, GERD  Controlled,  Endo/Other  diabetes, Type 2, Oral Hypoglycemic Agents  Renal/GU CRFRenal disease  negative genitourinary   Musculoskeletal   Abdominal (+) + obese,   Peds  Hematology negative hematology ROS (+)   Anesthesia Other Findings   Reproductive/Obstetrics negative OB ROS                           Anesthesia Physical  Anesthesia Plan  ASA: 3  Anesthesia Plan: General   Post-op Pain Management: Minimal or no pain anticipated   Induction: Intravenous  PONV Risk Score and Plan: Propofol infusion and TIVA  Airway Management Planned: Natural Airway  Additional Equipment:   Intra-op Plan:   Post-operative Plan:   Informed Consent: I have reviewed the patients History and Physical, chart, labs and discussed the procedure including the risks, benefits  and alternatives for the proposed anesthesia with the patient or authorized representative who has indicated his/her understanding and acceptance.     Dental Advisory Given  Plan Discussed with: CRNA and Anesthesiologist  Anesthesia Plan Comments:        Anesthesia Quick Evaluation

## 2022-01-19 NOTE — H&P (Signed)
Jonathon Bellows, MD [address], [place], [place], [place], [address] [address], [place], [place], [place], [address] Phone: [phone]  Fax: [phone]  Primary Care Physician:  Jearld Fenton, NP   Pre-Procedure History & Physical: HPI:  Monique Franklin is a 66 y.o. female is here for an colonoscopy.   Past Medical History:  Diagnosis Date   Cardiomyopathy ([place])    CHF (congestive heart failure) (HCC)    Class I   Coronary artery disease    Diabetes mellitus without complication (HCC)    GERD (gastroesophageal reflux disease)    HFrEF (heart failure with reduced ejection fraction) (HCC)    Hyperlipidemia    Hypertension    MI (myocardial infarction) ([facility])    PAD (peripheral artery disease) ([facility])     Past Surgical History:  Procedure Laterality Date   BREAST BIOPSY Bilateral 2016   neg   CARDIAC CATHETERIZATION     CARDIAC DEFIBRILLATOR PLACEMENT     CATARACT EXTRACTION Bilateral    CATARACT EXTRACTION W/PHACO Right 11/09/2020   Procedure: CATARACT EXTRACTION PHACO AND INTRAOCULAR LENS PLACEMENT ([facility]) RIGHT VIVITY  DIABETIC;  Surgeon: Birder Robson, MD;  Location: [facility];  Service: Ophthalmology;  Laterality: Right;  2.85 00:33.2   CATARACT EXTRACTION W/PHACO Left 11/30/2020   Procedure: CATARACT EXTRACTION PHACO AND INTRAOCULAR LENS PLACEMENT ([facility]) LEFT DIABETIC VIVITY 3.86 00:28.3;  Surgeon: Birder Robson, MD;  Location: [facility];  Service: Ophthalmology;  Laterality: Left;   COLONOSCOPY WITH PROPOFOL N/A 10/31/2019   Procedure: COLONOSCOPY WITH PROPOFOL;  Surgeon: Virgel Manifold, MD;  Location: [facility];  Service: Endoscopy;  Laterality: N/A;  priority 4   COMBINED AUGMENTATION MAMMAPLASTY AND ABDOMINOPLASTY     CORONARY ARTERY BYPASS GRAFT  08/2017   X2 with LIMA to LAD; 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;Severe 3 vessel CAD-PCI to LAD and LC x(5stents) Coronary dissection of LM to LAD    CORONARY ARTERY BYPASS GRAFT      CORONARY STENT PLACEMENT     PERCUTANEOUS CORONARY STENT INTERVENTION (PCI-S)     REDUCTION MAMMAPLASTY Bilateral 2017   TONSILLECTOMY      Prior to Admission medications   Medication Sig Start Date End Date Taking? Authorizing Provider  aspirin EC 81 MG tablet Take 81 mg by mouth daily.   Yes [provider]  carvedilol (COREG) 25 MG tablet TAKE 1 TABLET BY MOUTH TWICE  DAILY 08/23/21  Yes Larey Dresser, MD  ENTRESTO 97-103 MG TAKE 1 TABLET BY MOUTH  TWICE DAILY 06/15/21  Yes Larey Dresser, MD  icosapent Ethyl (VASCEPA) 1 g capsule Take 2 capsules (2 g total) by mouth 2 (two) times daily. 03/08/21  Yes Larey Dresser, MD  oxybutynin (DITROPAN-XL) 5 MG 24 hr tablet TAKE 1 TABLET(5 MG) BY MOUTH AT BEDTIME 05/25/21  Yes Schuman, Christanna R, MD  rosuvastatin (CRESTOR) 40 MG tablet TAKE 1 TABLET BY MOUTH  DAILY AT 6PM 05/19/21  Yes Larey Dresser, MD  spironolactone (ALDACTONE) 25 MG tablet Take 1 tablet (25 mg total) by mouth daily. 03/08/21  Yes Larey Dresser, MD  clobetasol ointment (TEMOVATE) 0.05 % Apply to affected area every night for 4 weeks, then every other day for 4 weeks and then twice a week for 4 weeks or until resolution. 07/02/20   Homero Fellers, MD  FARXIGA 10 MG TABS tablet TAKE 1 TABLET BY MOUTH  DAILY 05/19/21   Larey Dresser, MD  glimepiride (AMARYL) 4 MG tablet  Take 1 tablet (4 mg total) by mouth in the morning and at bedtime. 12/26/21   Lorre Munroe, NP  metFORMIN (GLUCOPHAGE) 1000 MG tablet TAKE ONE-HALF TABLET BY MOUTH  TWICE DAILY WITH A MEAL 12/22/21   Lorre Munroe, NP  Multiple Vitamin (MULTIVITAMIN) tablet Take 1 tablet by mouth daily.    [provider]    Allergies as of 12/29/2021 - Review Complete 12/26/2021  Allergen Reaction Noted   Ticagrelor Rash 01/02/2019    Family History  Problem Relation Age of Onset   Heart attack Mother    COPD Father    Thyroid cancer Sister    Breast cancer Sister 36   Diabetes  Sister    Diabetes Brother     Social History   Socioeconomic History   Marital status: Widowed    Spouse name: Not on file   Number of children: Not on file   Years of education: Not on file   Highest education level: Not on file  Occupational History   Not on file  Tobacco Use   Smoking status: Former    Packs/day: 1.00    Years: 33.00    Total pack years: 33.00    Types: Cigarettes    Quit date: 2005    Years since quitting: 18.7   Smokeless tobacco: Never  Vaping Use   Vaping Use: Never used  Substance and Sexual Activity   Alcohol use: Yes    Comment: occasional   Drug use: Never   Sexual activity: Not Currently    Birth control/protection: Post-menopausal  Other Topics Concern   Not on file  Social History Narrative   Not on file   Social Determinants of Health   Financial Resource Strain: Not on file  Food Insecurity: Not on file  Transportation Needs: Not on file  Physical Activity: Not on file  Stress: Not on file  Social Connections: Not on file  Intimate Partner Violence: Not on file    Review of Systems: See HPI, otherwise negative ROS  Physical Exam: BP (!) 94/59   Pulse 85   Temp (!) 96 F (35.6 C)   Resp 18   Ht 5\' 3"  (1.6 m)   Wt 79.4 kg   SpO2 99%   BMI 31.00 kg/m  General:   Alert,  pleasant and cooperative in NAD Head:  Normocephalic and atraumatic. Neck:  Supple; no masses or thyromegaly. Lungs:  Clear throughout to auscultation, normal respiratory effort.    Heart:  +S1, +S2, Regular rate and rhythm, No edema. Abdomen:  Soft, nontender and nondistended. Normal bowel sounds, without guarding, and without rebound.   Neurologic:  Alert and  oriented x4;  grossly normal neurologically.  Impression/Plan: [name] is here for an colonoscopy to be performed for Screening colonoscopy average risk   Risks, benefits, limitations, and alternatives regarding  colonoscopy have been reviewed with the patient.  Questions have been  answered.  All parties agreeable.   [name], MD  01/19/2022, 8:23 AM

## 2022-01-19 NOTE — Op Note (Signed)
[facility] Gastroenterology Patient Name: Monique Franklin Procedure Date: 01/19/2022 8:26 AM MRN: [id number] Account #: [id number] Date of Birth: Jul 22, 1955 Admit Type: Outpatient Age: 66 Room: [facility] ENDO ROOM 4 Gender: Female Note Status: Finalized Instrument Name: Jasper Riling [id number] Procedure:             Colonoscopy Indications:           Screening for colorectal malignant neoplasm Providers:             Jonathon Bellows MD, MD Referring MD:          Jearld Fenton (Referring MD) Medicines:             Monitored Anesthesia Care Complications:         No immediate complications. Procedure:             Pre-Anesthesia Assessment:                        - Prior to the procedure, a History and Physical was                         performed, and patient medications, allergies and                         sensitivities were reviewed. The patient's tolerance                         of previous anesthesia was reviewed.                        - The risks and benefits of the procedure and the                         sedation options and risks were discussed with the                         patient. All questions were answered and informed                         consent was obtained.                        - ASA Grade Assessment: II - A patient with mild                         systemic disease.                        After obtaining informed consent, the colonoscope was                         passed under direct vision. Throughout the procedure,                         the patient's blood pressure, pulse, and oxygen                         saturations were monitored continuously. The                         Colonoscope was introduced  through the anus and                         advanced to the the cecum, identified by the                         appendiceal orifice. The colonoscopy was performed                         with ease. The patient tolerated the procedure well.                          The quality of the bowel preparation was excellent. Findings:      The perianal and digital rectal examinations were normal.      Multiple small and large-mouthed diverticula were found in the entire       colon.      The exam was otherwise without abnormality on direct and retroflexion       views. Impression:            - Diverticulosis in the entire examined colon.                        - The examination was otherwise normal on direct and                         retroflexion views.                        - No specimens collected. Recommendation:        - Discharge patient to home (with escort).                        - Resume previous diet.                        - Continue present medications.                        - Repeat colonoscopy in 5 years for screening purposes. Procedure Code(s):     --- Professional ---                        [phone], Colonoscopy, flexible; diagnostic, including                         collection of specimen(s) by brushing or washing, when                         performed (separate procedure) Diagnosis Code(s):     --- Professional ---                        Z12.11, Encounter for screening for malignant neoplasm                         of colon                        K57.30, Diverticulosis of large intestine without  perforation or abscess without bleeding CPT copyright 2019 American Medical Association. All rights reserved. The codes documented in this report are preliminary and upon coder review may  be revised to meet current compliance requirements. Jonathon Bellows, MD Jonathon Bellows MD, MD 01/19/2022 8:51:42 AM This report has been signed electronically. Number of Addenda: 0 Note Initiated On: 01/19/2022 8:26 AM Scope Withdrawal Time: 0 hours 6 minutes 23 seconds  Total Procedure Duration: 0 hours 10 minutes 19 seconds  Estimated Blood Loss:  Estimated blood loss: none.      [facility]

## 2022-01-19 NOTE — Transfer of Care (Signed)
Immediate Anesthesia Transfer of Care Note  Patient: Monique Franklin  Procedure(s) Performed: COLONOSCOPY WITH PROPOFOL  Patient Location: PACU and Endoscopy Unit  Anesthesia Type:General  Level of Consciousness: drowsy and patient cooperative  Airway & Oxygen Therapy: Patient Spontanous Breathing  Post-op Assessment: Report given to RN and Post -op Vital signs reviewed and stable  Post vital signs: Reviewed and stable  Last Vitals:  Vitals Value Taken Time  BP 90/60 01/19/22 0854  Temp 36.2 C 01/19/22 0854  Pulse 81 01/19/22 0855  Resp 15 01/19/22 0855  SpO2 98 % 01/19/22 0855  Vitals shown include unvalidated device data.  Last Pain:  Vitals:   01/19/22 0854  TempSrc: Tympanic  PainSc: 0-No pain         Complications: No notable events documented.

## 2022-01-20 ENCOUNTER — Encounter: Payer: Self-pay | Admitting: Cardiology

## 2022-01-20 ENCOUNTER — Ambulatory Visit: Payer: Medicare Other | Attending: Cardiology | Admitting: Cardiology

## 2022-01-20 VITALS — BP 86/56 | HR 74 | Ht 63.0 in | Wt 178.6 lb

## 2022-01-20 DIAGNOSIS — I2510 Atherosclerotic heart disease of native coronary artery without angina pectoris: Secondary | ICD-10-CM | POA: Diagnosis not present

## 2022-01-20 DIAGNOSIS — Z95810 Presence of automatic (implantable) cardiac defibrillator: Secondary | ICD-10-CM | POA: Insufficient documentation

## 2022-01-20 DIAGNOSIS — I255 Ischemic cardiomyopathy: Secondary | ICD-10-CM | POA: Diagnosis not present

## 2022-01-20 MED ORDER — SPIRONOLACTONE 25 MG PO TABS: 12.5000 mg | ORAL_TABLET | Freq: Every day | ORAL | 3 refills | Status: DC

## 2022-01-20 NOTE — Patient Instructions (Signed)
Medication Instructions:   Your physician has recommended you make the following change in your medication:    DECREASE your Spironolactone to 12.5 MG once a day.   *If you need a refill on your cardiac medications before your next appointment, please call your pharmacy*   Lab Work:  Your physician recommends that you return for lab work (BMP) in: 1 week  - [facility] go to the [facility]. You will check in at the front desk to the right as you walk into the atrium. Valet Parking is offered if needed. - No appointment needed. You may go any day between 7 am and 6 pm.    Testing/Procedures:  Your physician has requested that you have an echocardiogram. Echocardiography is a painless test that uses sound waves to create images of your heart. It provides your doctor with information about the size and shape of your heart and how well your heart's chambers and valves are working. This procedure takes approximately one hour. There are no restrictions for this procedure. Please do NOT wear cologne, perfume, aftershave, or lotions (deodorant is allowed). Please arrive 15 minutes prior to your appointment time.    Follow-Up: At [facility], you and your health needs are our priority.  As part of our continuing mission to provide you with exceptional heart care, we have created designated Provider Care Teams.  These Care Teams include your primary Cardiologist (physician) and Advanced Practice Providers (APPs -  Physician Assistants and Nurse Practitioners) who all work together to provide you with the care you need, when you need it.  We recommend signing up for the patient portal called "MyChart".  Sign up information is provided on this After Visit Summary.  MyChart is used to connect with patients for Virtual Visits (Telemedicine).  Patients are able to view lab/test results, encounter notes, upcoming appointments, etc.  Non-urgent messages can be sent to your provider as well.    To learn more about what you can do with MyChart, go to [email or web address].    Your next appointment:   6-8 week(s)  The format for your next appointment:   In Person  Provider:   You may see Kate Sable, MD or one of the following Advanced Practice Providers on your designated Care Team:   Murray Hodgkins, NP Christell Faith, PA-C Cadence Kathlen Mody, PA-C Gerrie Nordmann, NP    Other Instructions   Important Information About Sugar

## 2022-01-20 NOTE — Progress Notes (Signed)
Cardiology Office Note:    Date:  01/20/2022   ID:  Aeon Koors, DOB 1955/08/06, MRN [id number]  PCP:  Lorre Munroe, NP  Cardiologist:  Debbe Odea, MD  Electrophysiologist:  None   Referring MD: Lorre Munroe, NP   No chief complaint on file.   History of Present Illness:    Monique Franklin is a 66 y.o. female with a hx of hypertension, hyperlipidemia, former smoker x20+ years, CAD, PCI to LAD and LCx(5 stents total), status post CABG x2 (LIMA to LAD, SVG to OM 2019), HFrEF EF, s/p ICD 2020([employer] device), PAD(left femoral thrombo-endarterectomy with patch angioplasty 12/2017) who presents for follow-up.  Patient being seen for CAD, cardiomyopathy.  On optimal GDMT for heart failure, denies chest pain, edema.  Last EF measured about a year ago was moderately reduced at 35 to 40%.  Compliant with medications as prescribed.  Had a colonoscopy yesterday, was told blood pressures are low.  Creatinine elevated on last lab work.   Prior notes Echo 02/2021 EF 35 to 40% Patient was originally seen to establish care.  She used to be followed at [facility] and later moved into the area.   After her CABG in 2019, she had a left femoral artery complication requiring left femoral patch angioplasty.  Earlier in 2020, she had an ICD placed due to low EF with ejection fraction of 31%.      Past Medical History:  Diagnosis Date   Cardiomyopathy (HCC)    CHF (congestive heart failure) (HCC)    Class I   Coronary artery disease    Diabetes mellitus without complication (HCC)    GERD (gastroesophageal reflux disease)    HFrEF (heart failure with reduced ejection fraction) (HCC)    Hyperlipidemia    Hypertension    MI (myocardial infarction) (HCC)    PAD (peripheral artery disease) (HCC)     Past Surgical History:  Procedure Laterality Date   BREAST BIOPSY Bilateral 2016   neg   CARDIAC CATHETERIZATION     CARDIAC DEFIBRILLATOR PLACEMENT     CATARACT EXTRACTION  Bilateral    CATARACT EXTRACTION W/PHACO Right 11/09/2020   Procedure: CATARACT EXTRACTION PHACO AND INTRAOCULAR LENS PLACEMENT (IOC) RIGHT VIVITY  DIABETIC;  Surgeon: Galen Manila, MD;  Location: [facility];  Service: Ophthalmology;  Laterality: Right;  2.85 00:33.2   CATARACT EXTRACTION W/PHACO Left 11/30/2020   Procedure: CATARACT EXTRACTION PHACO AND INTRAOCULAR LENS PLACEMENT (IOC) LEFT DIABETIC VIVITY 3.86 00:28.3;  Surgeon: Galen Manila, MD;  Location[facility];  Service: Ophthalmology;  Laterality: Left;   COLONOSCOPY WITH PROPOFOL N/A 10/31/2019   Procedure: COLONOSCOPY WITH PROPOFOL;  Surgeon: Pasty Spillers, MD;  Location: [facility];  Service: Endoscopy;  Laterality: N/A;  priority 4   COMBINED AUGMENTATION MAMMAPLASTY AND ABDOMINOPLASTY     CORONARY ARTERY BYPASS GRAFT  08/2017   X2 with LIMA to LAD; 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;Severe 3 vessel CAD-PCI to LAD and LC x(5stents) Coronary dissection of LM to LAD    CORONARY ARTERY BYPASS GRAFT     CORONARY STENT PLACEMENT     PERCUTANEOUS CORONARY STENT INTERVENTION (PCI-S)     REDUCTION MAMMAPLASTY Bilateral 2017   TONSILLECTOMY      Current Medications: Current Meds  Medication Sig   aspirin EC 81 MG tablet Take 81 mg by mouth daily.   carvedilol (COREG) 25 MG tablet TAKE 1 TABLET BY MOUTH TWICE  DAILY   clobetasol ointment (TEMOVATE) 0.05 % Apply to affected area  every night for 4 weeks, then every other day for 4 weeks and then twice a week for 4 weeks or until resolution.   ENTRESTO 97-103 MG TAKE 1 TABLET BY MOUTH  TWICE DAILY   FARXIGA 10 MG TABS tablet TAKE 1 TABLET BY MOUTH  DAILY   glimepiride (AMARYL) 4 MG tablet Take 1 tablet (4 mg total) by mouth in the morning and at bedtime.   icosapent Ethyl (VASCEPA) 1 g capsule Take 2 capsules (2 g total) by mouth 2 (two) times daily.   metFORMIN (GLUCOPHAGE) 1000 MG tablet TAKE ONE-HALF TABLET BY MOUTH  TWICE DAILY WITH A MEAL   Multiple  Vitamin (MULTIVITAMIN) tablet Take 1 tablet by mouth daily.   oxybutynin (DITROPAN-XL) 5 MG 24 hr tablet TAKE 1 TABLET(5 MG) BY MOUTH AT BEDTIME   rosuvastatin (CRESTOR) 40 MG tablet TAKE 1 TABLET BY MOUTH  DAILY AT 6PM   [DISCONTINUED] spironolactone (ALDACTONE) 25 MG tablet Take 1 tablet (25 mg total) by mouth daily.     Allergies:   Ticagrelor   Social History   Socioeconomic History   Marital status: Widowed    Spouse name: Not on file   Number of children: Not on file   Years of education: Not on file   Highest education level: Not on file  Occupational History   Not on file  Tobacco Use   Smoking status: Former    Packs/day: 1.00    Years: 33.00    Total pack years: 33.00    Types: Cigarettes    Quit date: 2005    Years since quitting: 18.7   Smokeless tobacco: Never  Vaping Use   Vaping Use: Never used  Substance and Sexual Activity   Alcohol use: Yes    Comment: occasional   Drug use: Never   Sexual activity: Not Currently    Birth control/protection: Post-menopausal  Other Topics Concern   Not on file  Social History Narrative   Not on file   Social Determinants of Health   Financial Resource Strain: Not on file  Food Insecurity: Not on file  Transportation Needs: Not on file  Physical Activity: Not on file  Stress: Not on file  Social Connections: Not on file     Family History: The patient's family history includes Breast cancer (age of onset: 50) in her sister; COPD in her father; Diabetes in her brother and sister; Heart attack in her mother; Thyroid cancer in her sister.  ROS:   Please see the history of present illness.     All other systems reviewed and are negative.  EKGs/Labs/Other Studies Reviewed:    The following studies were reviewed today:  EKG:  EKG is ordered today.  EKG shows normal sinus rhythm,  Recent Labs: 01/31/2021: TSH 1.970 04/25/2021: ALT 15; Hemoglobin 11.8; Platelets 232 01/11/2022: BUN 27; Creatinine, Ser 1.67;  Potassium 4.3; Sodium 137  Recent Lipid Panel    Component Value Date/Time   CHOL 129 12/26/2021 1009   TRIG 188 (H) 12/26/2021 1009   HDL 52 12/26/2021 1009   CHOLHDL 2.5 12/26/2021 1009   VLDL 53 (H) 08/31/2020 0922   LDLCALC 51 12/26/2021 1009   LDLDIRECT 59.0 05/12/2020 1537    Physical Exam:    VS:  BP (!) 86/56   Pulse 74   Ht 5\' 3"  (1.6 m)   Wt 178 lb 9.6 oz (81 kg)   SpO2 96%   BMI 31.64 kg/m     Wt Readings from Last 3  Encounters:  01/20/22 178 lb 9.6 oz (81 kg)  01/19/22 175 lb (79.4 kg)  01/02/22 180 lb 12.8 oz (82 kg)     GEN:  Well nourished, well developed in no acute distress HEENT: Normal NECK: No JVD; No carotid bruits CARDIAC: RRR, no murmurs, rubs, gallops RESPIRATORY:  Clear to auscultation without rales, wheezing or rhonchi  ABDOMEN: Soft, non-tender, non-distended MUSCULOSKELETAL:  No edema; No deformity  SKIN: Warm and dry NEUROLOGIC:  Alert and oriented x 3 PSYCHIATRIC:  Normal affect   ASSESSMENT:    1. Ischemic cardiomyopathy   2. Coronary artery disease involving native coronary artery of native heart without angina pectoris   3. ICD (implantable cardioverter-defibrillator) in place    PLAN:    In order of problems listed above:  Ischemic cardiomyopathy EF 35-40%, s/p ICD.  NYHA class II symptoms.  Blood pressure running low, creatinine elevated.  Reduce Aldactone to 12.5 mg daily.  Repeat BMP in 1 week.  If elevated, consider reducing Entresto.  Continue Coreg 25 mg twice daily, Entresto 97-103,  Farxiga 10 mg daily.  Repeat echocardiogram. CAD, PCI's, CABG x2.  Denies chest pain. continue aspirin 81 mg, Crestor 40 mg daily. S/p AICD.  Keep appointments with device clinic for frequent checks.  Follow-up in 6 to 8 weeks   Total encounter time 40 minutes  Greater than 50% was spent in counseling and coordination of care with the patient   Medication Adjustments/Labs and Tests Ordered: Current medicines are reviewed at length with  the patient today.  Concerns regarding medicines are outlined above.  Orders Placed This Encounter  Procedures   Basic metabolic panel   EKG 12-Lead   ECHOCARDIOGRAM COMPLETE    Meds ordered this encounter  Medications   spironolactone (ALDACTONE) 25 MG tablet    Sig: Take 0.5 tablets (12.5 mg total) by mouth daily.    Dispense:  30 tablet    Refill:  3     Patient Instructions  Medication Instructions:   Your physician has recommended you make the following change in your medication:    DECREASE your Spironolactone to 12.5 MG once a day.   *If you need a refill on your cardiac medications before your next appointment, please call your pharmacy*   Lab Work:  Your physician recommends that you return for lab work (BMP) in: 1 week  - Please go to the [facility]. You will check in at the front desk to the right as you walk into the atrium. Valet Parking is offered if needed. - No appointment needed. You may go any day between 7 am and 6 pm.    Testing/Procedures:  Your physician has requested that you have an echocardiogram. Echocardiography is a painless test that uses sound waves to create images of your heart. It provides your doctor with information about the size and shape of your heart and how well your heart's chambers and valves are working. This procedure takes approximately one hour. There are no restrictions for this procedure. Please do NOT wear cologne, perfume, aftershave, or lotions (deodorant is allowed). Please arrive 15 minutes prior to your appointment time.    Follow-Up: At [facility], you and your health needs are our priority.  As part of our continuing mission to provide you with exceptional heart care, we have created designated Provider Care Teams.  These Care Teams include your primary Cardiologist (physician) and Advanced Practice Providers (APPs -  Physician Assistants and Nurse Practitioners) who all work together  to provide  you with the care you need, when you need it.  We recommend signing up for the patient portal called "MyChart".  Sign up information is provided on this After Visit Summary.  MyChart is used to connect with patients for Virtual Visits (Telemedicine).  Patients are able to view lab/test results, encounter notes, upcoming appointments, etc.  Non-urgent messages can be sent to your provider as well.   To learn more about what you can do with MyChart, go to [email or web address].    Your next appointment:   6-8 week(s)  The format for your next appointment:   In Person  Provider:   You may see Debbe Odea, MD or one of the following Advanced Practice Providers on your designated Care Team:   Nicolasa Ducking, NP Eula Listen, PA-C Cadence Fransico Michael, PA-C Charlsie Quest, NP    Other Instructions   Important Information About Sugar         Signed, Debbe Odea, MD  01/20/2022 10:37 AM    [facility]

## 2022-01-23 ENCOUNTER — Ambulatory Visit (INDEPENDENT_AMBULATORY_CARE_PROVIDER_SITE_OTHER): Payer: Medicare Other

## 2022-01-23 DIAGNOSIS — I255 Ischemic cardiomyopathy: Secondary | ICD-10-CM | POA: Diagnosis not present

## 2022-01-24 LAB — CUP PACEART REMOTE DEVICE CHECK
Battery Remaining Longevity: 70 mo
Battery Remaining Percentage: 67 %
Battery Voltage: 2.98 V
Brady Statistic RV Percent Paced: 1 %
Date Time Interrogation Session: 20231016034629
HighPow Impedance: 75 Ohm
HighPow Impedance: 75 Ohm
Implantable Lead Implant Date: 20200707
Implantable Lead Location: 753860
Implantable Pulse Generator Implant Date: 20200707
Lead Channel Impedance Value: 630 Ohm
Lead Channel Pacing Threshold Amplitude: 0.5 V
Lead Channel Pacing Threshold Pulse Width: 0.5 ms
Lead Channel Sensing Intrinsic Amplitude: 11.7 mV
Lead Channel Setting Pacing Amplitude: 2.5 V
Lead Channel Setting Pacing Pulse Width: 0.5 ms
Lead Channel Setting Sensing Sensitivity: 0.5 mV
Pulse Gen Serial Number: 9816687

## 2022-01-27 ENCOUNTER — Telehealth: Payer: Self-pay

## 2022-01-27 ENCOUNTER — Other Ambulatory Visit
Admission: RE | Admit: 2022-01-27 | Discharge: 2022-01-27 | Disposition: A | Discharge status: Home / Self Care | Payer: Medicare Other | Attending: Cardiology | Admitting: Cardiology

## 2022-01-27 DIAGNOSIS — I2510 Atherosclerotic heart disease of native coronary artery without angina pectoris: Secondary | ICD-10-CM | POA: Insufficient documentation

## 2022-01-27 DIAGNOSIS — I255 Ischemic cardiomyopathy: Secondary | ICD-10-CM | POA: Diagnosis present

## 2022-01-27 DIAGNOSIS — I509 Heart failure, unspecified: Secondary | ICD-10-CM

## 2022-01-27 DIAGNOSIS — I5022 Chronic systolic (congestive) heart failure: Secondary | ICD-10-CM

## 2022-01-27 LAB — BASIC METABOLIC PANEL
Anion gap: 7 (ref 5–15)
BUN: 36 mg/dL — ABNORMAL HIGH (ref 8–23)
CO2: 24 mmol/L (ref 22–32)
Calcium: 10.1 mg/dL (ref 8.9–10.3)
Chloride: 108 mmol/L (ref 98–111)
Creatinine, Ser: 1.65 mg/dL — ABNORMAL HIGH (ref 0.44–1.00)
GFR, Estimated: 34 mL/min — ABNORMAL LOW (ref >60)
Glucose, Bld: 223 mg/dL — ABNORMAL HIGH (ref 70–99)
Potassium: 4.3 mmol/L (ref 3.5–5.1)
Sodium: 139 mmol/L (ref 135–145)

## 2022-01-27 MED ORDER — ENTRESTO 97-103 MG PO TABS: ORAL_TABLET | ORAL | 3 refills | Status: DC

## 2022-01-27 NOTE — Telephone Encounter (Signed)
-----   Message from Kate Sable, MD sent at 01/27/2022  3:56 PM EDT ----- Creatinine still elevated, stop Aldactone.  Repeat BMP in 2 weeks.

## 2022-01-27 NOTE — Telephone Encounter (Signed)
Called patient and discussed result note. She informed me that her blood pressure has been running low, her most recent values were 80/57, and 90/56. I spoke with Dr.Agbor-Etang and he recommended that the patient decrease her dose of Entresto to half her current tab of 97/103 MG. Patient verbalized understanding and agreed with plan. She confirmed that she will get a lab draw in 2 weeks.

## 2022-02-10 ENCOUNTER — Other Ambulatory Visit
Admission: RE | Admit: 2022-02-10 | Discharge: 2022-02-10 | Disposition: A | Discharge status: Home / Self Care | Payer: Medicare Other | Attending: Cardiology | Admitting: Cardiology

## 2022-02-10 DIAGNOSIS — I5022 Chronic systolic (congestive) heart failure: Secondary | ICD-10-CM | POA: Diagnosis present

## 2022-02-10 LAB — BASIC METABOLIC PANEL
Anion gap: 8 (ref 5–15)
BUN: 25 mg/dL — ABNORMAL HIGH (ref 8–23)
CO2: 22 mmol/L (ref 22–32)
Calcium: 9.2 mg/dL (ref 8.9–10.3)
Chloride: 109 mmol/L (ref 98–111)
Creatinine, Ser: 1.39 mg/dL — ABNORMAL HIGH (ref 0.44–1.00)
GFR, Estimated: 42 mL/min — ABNORMAL LOW (ref >60)
Glucose, Bld: 253 mg/dL — ABNORMAL HIGH (ref 70–99)
Potassium: 4.2 mmol/L (ref 3.5–5.1)
Sodium: 139 mmol/L (ref 135–145)

## 2022-02-17 NOTE — Progress Notes (Signed)
Remote ICD transmission.   

## 2022-02-19 ENCOUNTER — Other Ambulatory Visit: Payer: Self-pay | Admitting: Internal Medicine

## 2022-02-19 DIAGNOSIS — E1169 Type 2 diabetes mellitus with other specified complication: Secondary | ICD-10-CM

## 2022-02-20 NOTE — Telephone Encounter (Signed)
Rx was sent to pharmacy on 12/26/21 #180/0. Pt is suppose to have FU OV.  Requested Prescriptions  Pending Prescriptions Disp Refills   glimepiride (AMARYL) 4 MG tablet [Pharmacy Med Name: Glimepiride 4 MG Oral Tablet] 180 tablet 3    Sig: TAKE 1 TABLET BY MOUTH IN THE  MORNING AND AT BEDTIME     Endocrinology:  Diabetes - Sulfonylureas Failed - 02/19/2022 10:18 PM      Failed - HBA1C is between 0 and 7.9 and within 180 days    Hemoglobin A1C  Date Value Ref Range Status  12/26/2021 9.4 (A) 4.0 - 5.6 % Final   Hgb A1c MFr Bld  Date Value Ref Range Status  04/25/2021 8.6 (H) <5.7 % of total Hgb Final    Comment:    For someone without known diabetes, a hemoglobin A1c value of 6.5% or greater indicates that they may have  diabetes and this should be confirmed with a follow-up  test. . For someone with known diabetes, a value <7% indicates  that their diabetes is well controlled and a value  greater than or equal to 7% indicates suboptimal  control. A1c targets should be individualized based on  duration of diabetes, age, comorbid conditions, and  other considerations. . Currently, no consensus exists regarding use of hemoglobin A1c for diagnosis of diabetes for children. .          Failed - Cr in normal range and within 360 days    Creat  Date Value Ref Range Status  04/25/2021 1.09 (H) 0.50 - 1.05 mg/dL Final   Creatinine, Ser  Date Value Ref Range Status  02/10/2022 1.39 (H) 0.44 - 1.00 mg/dL Final   Creatinine, Urine  Date Value Ref Range Status  04/25/2021 67 20 - 275 mg/dL Final         Passed - Valid encounter within last 6 months    Recent Outpatient Visits           1 month ago Type 2 diabetes mellitus with other specified complication, without long-term current use of insulin (HCC)   [facility] [place], Salvadore Oxford, NP   10 months ago Type 2 diabetes mellitus with other specified complication, without long-term current use of insulin [facility])    [facility], Salvadore Oxford, NP   1 year ago Influenza vaccine administered   [facility] [place], [place] W, NP   1 year ago Type 2 diabetes mellitus with other specified complication, without long-term current use of insulin [facility] [place])   [facility], Salvadore Oxford, NP       Future Appointments             In 4 weeks Agbor-Etang, Arlys John, MD [facility] [place]

## 2022-02-21 ENCOUNTER — Other Ambulatory Visit: Payer: Self-pay

## 2022-02-21 ENCOUNTER — Encounter: Payer: Self-pay | Admitting: Internal Medicine

## 2022-02-21 DIAGNOSIS — E1169 Type 2 diabetes mellitus with other specified complication: Secondary | ICD-10-CM

## 2022-02-21 MED ORDER — GLIMEPIRIDE 4 MG PO TABS: 4.0000 mg | ORAL_TABLET | Freq: Two times a day (BID) | ORAL | 0 refills | Status: DC

## 2022-02-21 NOTE — Telephone Encounter (Signed)
FYI/ Pt called about refill / she was advised the request was too early and rescheduled her appt to 12.18.23 for f/u

## 2022-02-22 ENCOUNTER — Ambulatory Visit: Payer: Medicare Other | Admitting: Internal Medicine

## 2022-03-16 ENCOUNTER — Ambulatory Visit: Payer: Medicare Other | Attending: Cardiology

## 2022-03-16 DIAGNOSIS — I255 Ischemic cardiomyopathy: Secondary | ICD-10-CM | POA: Insufficient documentation

## 2022-03-16 DIAGNOSIS — I2510 Atherosclerotic heart disease of native coronary artery without angina pectoris: Secondary | ICD-10-CM | POA: Diagnosis present

## 2022-03-16 LAB — ECHOCARDIOGRAM COMPLETE
AR max vel: 2.23 cm2
AV Area VTI: 2.35 cm2
AV Area mean vel: 2.36 cm2
AV Mean grad: 4 mmHg
AV Peak grad: 6.6 mmHg
Ao pk vel: 1.28 m/s
Area-P 1/2: 2.69 cm2
P 1/2 time: 365 msec
S' Lateral: 3.6 cm

## 2022-03-16 MED ORDER — PERFLUTREN LIPID MICROSPHERE
1.0000 mL | INTRAVENOUS | Status: AC | PRN
  Administered 2022-03-16: 2 mL via INTRAVENOUS

## 2022-03-17 ENCOUNTER — Other Ambulatory Visit: Payer: Self-pay | Admitting: Internal Medicine

## 2022-03-17 ENCOUNTER — Other Ambulatory Visit (HOSPITAL_COMMUNITY): Payer: Self-pay | Admitting: Cardiology

## 2022-03-17 NOTE — Telephone Encounter (Signed)
Requested Prescriptions  Pending Prescriptions Disp Refills   metFORMIN (GLUCOPHAGE) 1000 MG tablet [Pharmacy Med Name: metFORMIN HCl 1000 MG Oral Tablet] 90 tablet 0    Sig: TAKE ONE-HALF TABLET BY MOUTH  TWICE DAILY WITH A MEAL     Endocrinology:  Diabetes - Biguanides Failed - 03/17/2022  5:57 AM      Failed - Cr in normal range and within 360 days    Creat  Date Value Ref Range Status  04/25/2021 1.09 (H) 0.50 - 1.05 mg/dL Final   Creatinine, Ser  Date Value Ref Range Status  02/10/2022 1.39 (H) 0.44 - 1.00 mg/dL Final   Creatinine, Urine  Date Value Ref Range Status  04/25/2021 67 20 - 275 mg/dL Final         Failed - HBA1C is between 0 and 7.9 and within 180 days    Hemoglobin A1C  Date Value Ref Range Status  12/26/2021 9.4 (A) 4.0 - 5.6 % Final   Hgb A1c MFr Bld  Date Value Ref Range Status  04/25/2021 8.6 (H) <5.7 % of total Hgb Final    Comment:    For someone without known diabetes, a hemoglobin A1c value of 6.5% or greater indicates that they may have  diabetes and this should be confirmed with a follow-up  test. . For someone with known diabetes, a value <7% indicates  that their diabetes is well controlled and a value  greater than or equal to 7% indicates suboptimal  control. A1c targets should be individualized based on  duration of diabetes, age, comorbid conditions, and  other considerations. . Currently, no consensus exists regarding use of hemoglobin A1c for diagnosis of diabetes for children. .          Failed - eGFR in normal range and within 360 days    GFR calc Af Amer  Date Value Ref Range Status  11/03/2019 >60 >60 mL/min Final   GFR, Estimated  Date Value Ref Range Status  02/10/2022 42 (L) >60 mL/min Final    Comment:    (NOTE) Calculated using the CKD-EPI Creatinine Equation (2021)    GFR  Date Value Ref Range Status  05/12/2020 53.53 (L) >60.00 mL/min Final    Comment:    Calculated using the CKD-EPI Creatinine Equation  (2021)   eGFR  Date Value Ref Range Status  04/25/2021 56 (L) > OR = 60 mL/min/1.80m Final    Comment:    The eGFR is based on the CKD-EPI 2021 equation. To calculate  the new eGFR from a previous Creatinine or Cystatin C result, go to https://www.kidney.org/professionals/ kdoqi/gfr%5Fcalculator          Failed - B12 Level in normal range and within 720 days    No results found for: "VITAMINB12"       Failed - CBC within normal limits and completed in the last 12 months    WBC  Date Value Ref Range Status  04/25/2021 7.8 3.8 - 10.8 Thousand/uL Final   RBC  Date Value Ref Range Status  04/25/2021 4.23 3.80 - 5.10 Million/uL Final   Hemoglobin  Date Value Ref Range Status  04/25/2021 11.8 11.7 - 15.5 g/dL Final   HCT  Date Value Ref Range Status  04/25/2021 35.9 35.0 - 45.0 % Final   MCHC  Date Value Ref Range Status  04/25/2021 32.9 32.0 - 36.0 g/dL Final   [facility] Date Value Ref Range Status  04/25/2021 27.9 27.0 - 33.0 pg Final   MCV  Date Value Ref Range Status  04/25/2021 84.9 80.0 - 100.0 fL Final   No results found for: "PLTCOUNTKUC", "LABPLAT", "POCPLA" RDW  Date Value Ref Range Status  04/25/2021 14.6 11.0 - 15.0 % Final         Passed - Valid encounter within last 6 months    Recent Outpatient Visits           2 months ago Type 2 diabetes mellitus with other specified complication, without long-term current use of insulin ([place])   [facility] [place], Coralie Keens, NP   10 months ago Type 2 diabetes mellitus with other specified complication, without long-term current use of insulin [facility]   [facility], Coralie Keens, NP   1 year ago Influenza vaccine administered   [facility] [place], [place] W, NP   1 year ago Type 2 diabetes mellitus with other specified complication, without long-term current use of insulin [facility])   [facility], Coralie Keens, NP       Future Appointments              In 3 days Agbor-Etang, Aaron Edelman, MD [place]   In 1 week Baity, Coralie Keens, NP [facility], [facility]

## 2022-03-20 ENCOUNTER — Encounter: Payer: Self-pay | Admitting: Cardiology

## 2022-03-20 ENCOUNTER — Ambulatory Visit: Payer: Medicare Other | Attending: Cardiology | Admitting: Cardiology

## 2022-03-20 VITALS — BP 102/64 | HR 71 | Ht 63.0 in | Wt 178.6 lb

## 2022-03-20 DIAGNOSIS — I5022 Chronic systolic (congestive) heart failure: Secondary | ICD-10-CM | POA: Insufficient documentation

## 2022-03-20 DIAGNOSIS — I255 Ischemic cardiomyopathy: Secondary | ICD-10-CM

## 2022-03-20 DIAGNOSIS — Z95810 Presence of automatic (implantable) cardiac defibrillator: Secondary | ICD-10-CM

## 2022-03-20 DIAGNOSIS — I2510 Atherosclerotic heart disease of native coronary artery without angina pectoris: Secondary | ICD-10-CM | POA: Insufficient documentation

## 2022-03-20 MED ORDER — ENTRESTO 49-51 MG PO TABS: 1.0000 | ORAL_TABLET | Freq: Two times a day (BID) | ORAL | 0 refills | Status: DC

## 2022-03-20 NOTE — Progress Notes (Signed)
Cardiology Office Note:    Date:  03/20/2022   ID:  Monique Franklin, DOB Nov 15, 1955, MRN [id number]  PCP:  Jearld Fenton, NP  Cardiologist:  Kate Sable, MD  Electrophysiologist:  None   Referring MD: Jearld Fenton, NP   Chief Complaint  Patient presents with   Follow-up    Testing follow up, no new cardiac concerns     History of Present Illness:    Monique Franklin is a 66 y.o. female with a hx of hypertension, hyperlipidemia, former smoker x20+ years, CAD, PCI to LAD and LCx(5 stents total), status post CABG x2 (LIMA to LAD, SVG to OM 2019), HFrEF EF35-40%, s/p ICD 2020([employer] device), PAD(left femoral thrombo-endarterectomy with patch angioplasty 12/2017) who presents for follow-up.  Being seen for medication management, CAD and cardiomyopathy.  Renal function worsening, low blood pressures noted at her last visit.  Entresto reduce to 49-51 mg twice daily, Aldactone was stopped.  Renal function improved, blood pressure also better.  Her dizziness has improved, blood pressure is now normal.  Repeat echo showed EF 35%.  Appears euvolemic, denies edema.  Feels well, has no concerns at this time  Prior notes Echo 03/2022 EF 30 to 35% Echo 02/2021 EF 35 to 40% Echo 01/2019 EF 30 to 35% Patient was originally seen to establish care.  She used to be followed at [facility] and later moved into the area.   After her CABG in 2019, she had a left femoral artery complication requiring left femoral patch angioplasty.  Earlier in 2020, she had an ICD placed due to low EF with ejection fraction of 31%.      Past Medical History:  Diagnosis Date   Cardiomyopathy ([place])    CHF (congestive heart failure) (HCC)    Class I   Coronary artery disease    Diabetes mellitus without complication (HCC)    GERD (gastroesophageal reflux disease)    HFrEF (heart failure with reduced ejection fraction) (HCC)    Hyperlipidemia    Hypertension    MI (myocardial infarction) ([place])    PAD  (peripheral artery disease) ([place])     Past Surgical History:  Procedure Laterality Date   BREAST BIOPSY Bilateral 2016   neg   CARDIAC CATHETERIZATION     CARDIAC DEFIBRILLATOR PLACEMENT     CATARACT EXTRACTION Bilateral    CATARACT EXTRACTION W/PHACO Right 11/09/2020   Procedure: CATARACT EXTRACTION PHACO AND INTRAOCULAR LENS PLACEMENT ([place]) RIGHT VIVITY  DIABETIC;  Surgeon: Birder Robson, MD;  Location: [place];  Service: Ophthalmology;  Laterality: Right;  2.85 00:33.2   CATARACT EXTRACTION W/PHACO Left 11/30/2020   Procedure: CATARACT EXTRACTION PHACO AND INTRAOCULAR LENS PLACEMENT ([facility]) LEFT DIABETIC VIVITY 3.86 00:28.3;  Surgeon: Birder Robson, MD;  Location: [place];  Service: Ophthalmology;  Laterality: Left;   COLONOSCOPY WITH PROPOFOL N/A 10/31/2019   Procedure: COLONOSCOPY WITH PROPOFOL;  Surgeon: Virgel Manifold, MD;  Location: [facility] ENDOSCOPY;  Service: Endoscopy;  Laterality: N/A;  priority 4   COLONOSCOPY WITH PROPOFOL N/A 01/19/2022   Procedure: COLONOSCOPY WITH PROPOFOL;  Surgeon: Jonathon Bellows, MD;  Location: [facility] ENDOSCOPY;  Service: Gastroenterology;  Laterality: N/A;   COMBINED AUGMENTATION MAMMAPLASTY AND ABDOMINOPLASTY     CORONARY ARTERY BYPASS GRAFT  08/2017   X2 with LIMA to LAD; 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;Severe 3 vessel CAD-PCI to LAD and LC x(5stents) Coronary dissection of LM to LAD    CORONARY ARTERY BYPASS GRAFT     CORONARY STENT PLACEMENT  PERCUTANEOUS CORONARY STENT INTERVENTION (PCI-S)     REDUCTION MAMMAPLASTY Bilateral 2017   TONSILLECTOMY      Current Medications: Current Meds  Medication Sig   aspirin EC 81 MG tablet Take 81 mg by mouth daily.   carvedilol (COREG) 25 MG tablet TAKE 1 TABLET BY MOUTH TWICE  DAILY   clobetasol ointment (TEMOVATE) 0.05 % Apply to affected area every night for 4 weeks, then every other day for 4 weeks and then twice a week for 4 weeks or until resolution.   FARXIGA 10 MG TABS  tablet TAKE 1 TABLET BY MOUTH  DAILY   glimepiride (AMARYL) 4 MG tablet Take 1 tablet (4 mg total) by mouth in the morning and at bedtime.   icosapent Ethyl (VASCEPA) 1 g capsule TAKE 2 CAPSULES BY MOUTH TWICE  DAILY   metFORMIN (GLUCOPHAGE) 1000 MG tablet TAKE ONE-HALF TABLET BY MOUTH  TWICE DAILY WITH A MEAL   Multiple Vitamin (MULTIVITAMIN) tablet Take 1 tablet by mouth daily.   oxybutynin (DITROPAN-XL) 5 MG 24 hr tablet TAKE 1 TABLET(5 MG) BY MOUTH AT BEDTIME   rosuvastatin (CRESTOR) 40 MG tablet TAKE 1 TABLET BY MOUTH  DAILY AT 6PM   sacubitril-valsartan (ENTRESTO) 49-51 MG Take 1 tablet by mouth 2 (two) times daily.   [DISCONTINUED] sacubitril-valsartan (ENTRESTO) 97-103 MG Take half a tablet twice a day.     Allergies:   Ticagrelor   Social History   Socioeconomic History   Marital status: Widowed    Spouse name: Not on file   Number of children: Not on file   Years of education: Not on file   Highest education level: Not on file  Occupational History   Not on file  Tobacco Use   Smoking status: Former    Packs/day: 1.00    Years: 33.00    Total pack years: 33.00    Types: Cigarettes    Quit date: 2005    Years since quitting: 18.9   Smokeless tobacco: Never  Vaping Use   Vaping Use: Never used  Substance and Sexual Activity   Alcohol use: Yes    Comment: occasional   Drug use: Never   Sexual activity: Not Currently    Birth control/protection: Post-menopausal  Other Topics Concern   Not on file  Social History Narrative   Not on file   Social Determinants of Health   Financial Resource Strain: Not on file  Food Insecurity: Not on file  Transportation Needs: Not on file  Physical Activity: Not on file  Stress: Not on file  Social Connections: Not on file     Family History: The patient's family history includes Breast cancer (age of onset: 34) in her sister; COPD in her father; Diabetes in her brother and sister; Heart attack in her mother; Thyroid  cancer in her sister.  ROS:   Please see the history of present illness.     All other systems reviewed and are negative.  EKGs/Labs/Other Studies Reviewed:    The following studies were reviewed today:  EKG:  EKG is ordered today.  EKG shows normal sinus rhythm,  Recent Labs: 04/25/2021: ALT 15; Hemoglobin 11.8; Platelets 232 02/10/2022: BUN 25; Creatinine, Ser 1.39; Potassium 4.2; Sodium 139  Recent Lipid Panel    Component Value Date/Time   CHOL 129 12/26/2021 1009   TRIG 188 (H) 12/26/2021 1009   HDL 52 12/26/2021 1009   CHOLHDL 2.5 12/26/2021 1009   VLDL 53 (H) 08/31/2020 0922   LDLCALC 51  12/26/2021 1009   LDLDIRECT 59.0 05/12/2020 1537    Physical Exam:    VS:  BP 102/64 (BP Location: Left Arm, Patient Position: Sitting, Cuff Size: Large)   Pulse 71   Ht 5\' 3"  (1.6 m)   Wt 178 lb 9.6 oz (81 kg)   SpO2 96%   BMI 31.64 kg/m     Wt Readings from Last 3 Encounters:  03/20/22 178 lb 9.6 oz (81 kg)  01/20/22 178 lb 9.6 oz (81 kg)  01/19/22 175 lb (79.4 kg)     GEN:  Well nourished, well developed in no acute distress HEENT: Normal NECK: No JVD; No carotid bruits CARDIAC: RRR, no murmurs, rubs, gallops RESPIRATORY:  Clear to auscultation without rales, wheezing or rhonchi  ABDOMEN: Soft, non-tender, non-distended MUSCULOSKELETAL:  No edema; No deformity  SKIN: Warm and dry NEUROLOGIC:  Alert and oriented x 3 PSYCHIATRIC:  Normal affect   ASSESSMENT:    1. Ischemic cardiomyopathy   2. Coronary artery disease involving native coronary artery of native heart without angina pectoris   3. ICD (implantable cardioverter-defibrillator) in place   4. Chronic systolic congestive heart failure (HCC)    PLAN:    In order of problems listed above:  Ischemic cardiomyopathy EF 35, s/p ICD.  NYHA class II symptoms.  Blood pressure better.  Continue Coreg 25 twice daily, Entresto 49-51 mg twice daily, continue Farxiga 10 mg daily. Aldactone previously stopped due to low  blood pressures and worsening renal function.  Repeat echo EF 30 to 35%. CAD, PCI's, CABG x2.  Denies chest pain. continue aspirin 81 mg, Crestor 40 mg daily. S/p AICD.  Keep appointments with device clinic for frequent checks.  Patient wants device clinic checks in [place] due to easier commute.  Follow-up in 6 months   Medication Adjustments/Labs and Tests Ordered: Current medicines are reviewed at length with the patient today.  Concerns regarding medicines are outlined above.  No orders of the defined types were placed in this encounter.   Meds ordered this encounter  Medications   sacubitril-valsartan (ENTRESTO) 49-51 MG    Sig: Take 1 tablet by mouth 2 (two) times daily.    Dispense:  180 tablet    Refill:  0     Patient Instructions       Signed, Kate Sable, MD  03/20/2022 9:14 AM    [place]

## 2022-03-20 NOTE — Patient Instructions (Addendum)
Medication Instructions:   Your physician recommends that you continue on your current medications as directed. Please refer to the Current Medication list given to you today. I have updated your Entresto prescription so you will not have to cut the tablets anymore.   *If you need a refill on your cardiac medications before your next appointment, please call your pharmacy*   Lab Work:  None Ordered  If you have labs (blood work) drawn today and your tests are completely normal, you will receive your results only by: MyChart Message (if you have MyChart) OR A paper copy in the mail If you have any lab test that is abnormal or we need to change your treatment, we will call you to review the results.   Testing/Procedures:  None Ordered   Follow-Up: At [facility], you and your health needs are our priority.  As part of our continuing mission to provide you with exceptional heart care, we have created designated Provider Care Teams.  These Care Teams include your primary Cardiologist (physician) and Advanced Practice Providers (APPs -  Physician Assistants and Nurse Practitioners) who all work together to provide you with the care you need, when you need it.  We recommend signing up for the patient portal called "MyChart".  Sign up information is provided on this After Visit Summary.  MyChart is used to connect with patients for Virtual Visits (Telemedicine).  Patients are able to view lab/test results, encounter notes, upcoming appointments, etc.  Non-urgent messages can be sent to your provider as well.   To learn more about what you can do with MyChart, go to [email or web address].    Your next appointment:   6 month(s)  The format for your next appointment:   In Person  Provider:   You may see Debbe Odea, MD or one of the following Advanced Practice Providers on your designated Care Team:   Nicolasa Ducking, NP Eula Listen, PA-C Cadence Fransico Michael, PA-C Charlsie Quest, NP  Other Instructions  PLEASE SCHEDULE NEXT Available with Dr. Graciela Husbands or Lalla Brothers -- Last seen 2020

## 2022-03-27 ENCOUNTER — Ambulatory Visit (INDEPENDENT_AMBULATORY_CARE_PROVIDER_SITE_OTHER): Payer: Medicare Other | Admitting: Internal Medicine

## 2022-03-27 ENCOUNTER — Encounter: Payer: Self-pay | Admitting: Internal Medicine

## 2022-03-27 VITALS — BP 108/62 | HR 78 | Temp 96.8°F | Wt 178.0 lb

## 2022-03-27 DIAGNOSIS — I219 Acute myocardial infarction, unspecified: Secondary | ICD-10-CM

## 2022-03-27 DIAGNOSIS — Z1231 Encounter for screening mammogram for malignant neoplasm of breast: Secondary | ICD-10-CM

## 2022-03-27 DIAGNOSIS — I2510 Atherosclerotic heart disease of native coronary artery without angina pectoris: Secondary | ICD-10-CM | POA: Diagnosis not present

## 2022-03-27 DIAGNOSIS — E2839 Other primary ovarian failure: Secondary | ICD-10-CM | POA: Diagnosis not present

## 2022-03-27 DIAGNOSIS — Z6831 Body mass index (BMI) 31.0-31.9, adult: Secondary | ICD-10-CM

## 2022-03-27 DIAGNOSIS — E6609 Other obesity due to excess calories: Secondary | ICD-10-CM

## 2022-03-27 DIAGNOSIS — E1169 Type 2 diabetes mellitus with other specified complication: Secondary | ICD-10-CM | POA: Diagnosis not present

## 2022-03-27 DIAGNOSIS — I428 Other cardiomyopathies: Secondary | ICD-10-CM

## 2022-03-27 DIAGNOSIS — N1832 Chronic kidney disease, stage 3b: Secondary | ICD-10-CM

## 2022-03-27 DIAGNOSIS — I509 Heart failure, unspecified: Secondary | ICD-10-CM

## 2022-03-27 DIAGNOSIS — I739 Peripheral vascular disease, unspecified: Secondary | ICD-10-CM

## 2022-03-27 DIAGNOSIS — K219 Gastro-esophageal reflux disease without esophagitis: Secondary | ICD-10-CM

## 2022-03-27 DIAGNOSIS — N3281 Overactive bladder: Secondary | ICD-10-CM

## 2022-03-27 DIAGNOSIS — E785 Hyperlipidemia, unspecified: Secondary | ICD-10-CM

## 2022-03-27 DIAGNOSIS — I10 Essential (primary) hypertension: Secondary | ICD-10-CM

## 2022-03-27 DIAGNOSIS — I255 Ischemic cardiomyopathy: Secondary | ICD-10-CM

## 2022-03-27 MED ORDER — GLIPIZIDE 10 MG PO TABS: 10.0000 mg | ORAL_TABLET | Freq: Two times a day (BID) | ORAL | 0 refills | Status: DC

## 2022-03-27 NOTE — Assessment & Plan Note (Signed)
Encourage diet and exercise for weight 

## 2022-03-27 NOTE — Assessment & Plan Note (Signed)
Continue carvedilol, Entresto, rosuvastatin, Vascepa and aspirin Lipid profile today Encouraged her to consume low-fat diet 

## 2022-03-27 NOTE — Assessment & Plan Note (Signed)
Continue Ditropan 

## 2022-03-27 NOTE — Assessment & Plan Note (Signed)
Avoid foods that trigger reflux Encourage weight loss as this can produce reflux symptoms Continue Tums as needed

## 2022-03-27 NOTE — Assessment & Plan Note (Signed)
POCT A1c 9% We will check urine microalbumin Continue metformin and Marcelline Deist Will discontinue glimepiride and start glipizide 10 mg 2 times daily She is hesitant to start injectable medication at this time Encourage low-carb diet and exercise for weight loss Encourage routine eye exam Flu and pneumonia vaccine UTD Encouraged her to her COVID booster

## 2022-03-27 NOTE — Assessment & Plan Note (Signed)
Controlled on carvedilol and Entresto Reinforced DASH diet and exercise for weight loss

## 2022-03-27 NOTE — Assessment & Plan Note (Signed)
Compensated Continue carvedilol and Entresto Reinforced DASH diet and exercise for weight loss

## 2022-03-27 NOTE — Assessment & Plan Note (Signed)
Continue valsartan for renal protection

## 2022-03-27 NOTE — Patient Instructions (Signed)

## 2022-03-27 NOTE — Progress Notes (Signed)
Subjective:    Patient ID: Monique Franklin, female    DOB: 06/14/1955, 66 y.o.   MRN: [id number]  HPI  Patient presents to clinic today for follow-up of chronic conditions.  HLD with CAD status post MI with PAD: Her last LDL was 51, triglycerides 294, 12/2021.  She denies myalgias on Rosuvastatin and Vascepa.  She is taking Aspirin as well.  She tries to consume a low-fat diet.  DM2: Her last A1c was 9.4%, 12/2021.  She is taking Metformin, Glimepiride and Farxiga as prescribed.  She does not check her sugars.  She checks her feet routinely.  Her last eye exam was 07/2021.  Flu 12/2021.  Prevnar 20 10/2020.  COVID Pfizer x 3.  CKD 3: Her last creatinine was 1.67, GFR 34, 01/2022.  She is on Valsartan for renal protection.  She does not follow with nephrology.  HTN with Cardiomyopathy: Her BP today is 108/62. She is taking Carvedilol, Entresto as prescribed.  ECG from 01/2022 reviewed.  CHF: She denies chronic cough, shortness of breath or lower extremity edema.  She is taking Carvedilol, Entresto as prescribed.  Echo from 01/2022 reviewed.  GERD: Triggered by tomato-based foods, laying down after eating.  She takes Tums as needed with good relief of symptoms.  Upper GI from 01/2022 reviewed.  OAB: She reports mainly urinary urgency.  She takes Oxybutynin as prescribed.  She does not follow with urology.  Review of Systems     Past Medical History:  Diagnosis Date   Cardiomyopathy (HCC)    CHF (congestive heart failure) (HCC)    Class I   Coronary artery disease    Diabetes mellitus without complication (HCC)    GERD (gastroesophageal reflux disease)    HFrEF (heart failure with reduced ejection fraction) (HCC)    Hyperlipidemia    Hypertension    MI (myocardial infarction) (HCC)    PAD (peripheral artery disease) (HCC)     Current Outpatient Medications  Medication Sig Dispense Refill   aspirin EC 81 MG tablet Take 81 mg by mouth daily.     carvedilol (COREG) 25 MG tablet TAKE 1  TABLET BY MOUTH TWICE  DAILY 180 tablet 3   clobetasol ointment (TEMOVATE) 0.05 % Apply to affected area every night for 4 weeks, then every other day for 4 weeks and then twice a week for 4 weeks or until resolution. 30 g 5   FARXIGA 10 MG TABS tablet TAKE 1 TABLET BY MOUTH  DAILY 90 tablet 3   glimepiride (AMARYL) 4 MG tablet Take 1 tablet (4 mg total) by mouth in the morning and at bedtime. 180 tablet 0   icosapent Ethyl (VASCEPA) 1 g capsule TAKE 2 CAPSULES BY MOUTH TWICE  DAILY 360 capsule 3   metFORMIN (GLUCOPHAGE) 1000 MG tablet TAKE ONE-HALF TABLET BY MOUTH  TWICE DAILY WITH A MEAL 90 tablet 0   Multiple Vitamin (MULTIVITAMIN) tablet Take 1 tablet by mouth daily.     oxybutynin (DITROPAN-XL) 5 MG 24 hr tablet TAKE 1 TABLET(5 MG) BY MOUTH AT BEDTIME 90 tablet 3   rosuvastatin (CRESTOR) 40 MG tablet TAKE 1 TABLET BY MOUTH  DAILY AT 6PM 90 tablet 3   sacubitril-valsartan (ENTRESTO) 49-51 MG Take 1 tablet by mouth 2 (two) times daily. 180 tablet 0   No current facility-administered medications for this visit.    Allergies  Allergen Reactions   Ticagrelor Rash    Family History  Problem Relation Age of Onset   Heart attack Mother  COPD Father    Thyroid cancer Sister    Breast cancer Sister 21   Diabetes Sister    Diabetes Brother     Social History   Socioeconomic History   Marital status: Widowed    Spouse name: Not on file   Number of children: Not on file   Years of education: Not on file   Highest education level: Not on file  Occupational History   Not on file  Tobacco Use   Smoking status: Former    Packs/day: 1.00    Years: 33.00    Total pack years: 33.00    Types: Cigarettes    Quit date: 2005    Years since quitting: 18.9   Smokeless tobacco: Never  Vaping Use   Vaping Use: Never used  Substance and Sexual Activity   Alcohol use: Yes    Comment: occasional   Drug use: Never   Sexual activity: Not Currently    Birth control/protection:  Post-menopausal  Other Topics Concern   Not on file  Social History Narrative   Not on file   Social Determinants of Health   Financial Resource Strain: Not on file  Food Insecurity: Not on file  Transportation Needs: Not on file  Physical Activity: Not on file  Stress: Not on file  Social Connections: Not on file  Intimate Partner Violence: Not on file     Constitutional: Denies fever, malaise, fatigue, headache or abrupt weight changes.  HEENT: Pt reports runny nose. Denies eye pain, eye redness, ear pain, ringing in the ears, wax buildup, nasal congestion, bloody nose, or sore throat. Respiratory: Pt reports acute cough. Denies difficulty breathing, shortness of breath, or sputum production.   Cardiovascular: Denies chest pain, chest tightness, palpitations or swelling in the hands or feet.  Gastrointestinal: Denies abdominal pain, bloating, constipation, diarrhea or blood in the stool.  GU: Patient reports urinary urgency.  Denies frequency, pain with urination, burning sensation, blood in urine, odor or discharge. Musculoskeletal: Denies decrease in range of motion, difficulty with gait, muscle pain or joint pain and swelling.  Skin: Denies redness, rashes, lesions or ulcercations.  Neurological: Denies dizziness, difficulty with memory, difficulty with speech or problems with balance and coordination.  Psych: Denies anxiety, depression, SI/HI.  No other specific complaints in a complete review of systems (except as listed in HPI above).  Objective:   Physical Exam  BP 108/62 (BP Location: Left Arm, Patient Position: Sitting, Cuff Size: Normal)   Pulse 78   Temp (!) 96.8 F (36 C) (Temporal)   Wt 178 lb (80.7 kg)   SpO2 98%   BMI 31.53 kg/m   Wt Readings from Last 3 Encounters:  03/20/22 178 lb 9.6 oz (81 kg)  01/20/22 178 lb 9.6 oz (81 kg)  01/19/22 175 lb (79.4 kg)    General: Appears her stated age, obese, in NAD. Skin: Warm, dry and intact. No ulcerations  noted. HEENT: Head: normal shape and size; Eyes: sclera white, no icterus, conjunctiva pink, PERRLA and EOMs intact;  Cardiovascular: Normal rate and rhythm. S1,S2 noted.  No murmur, rubs or gallops noted. No JVD or BLE edema. No carotid bruits noted. Pulmonary/Chest: Normal effort and positive vesicular breath sounds. No respiratory distress. No wheezes, rales or ronchi noted.  Abdomen: Soft and nontender. Normal bowel sounds.  Musculoskeletal: No difficulty with gait.  Neurological: Alert and oriented. Coordination normal.     BMET    Component Value Date/Time   NA 139 02/10/2022 0928  NA 139 06/12/2019 0840   K 4.2 02/10/2022 0928   CL 109 02/10/2022 0928   CO2 22 02/10/2022 0928   GLUCOSE 253 (H) 02/10/2022 0928   BUN 25 (H) 02/10/2022 0928   BUN 28 (H) 06/12/2019 0840   CREATININE 1.39 (H) 02/10/2022 0928   CREATININE 1.09 (H) 04/25/2021 0920   CALCIUM 9.2 02/10/2022 0928   GFRNONAA 42 (L) 02/10/2022 0928   GFRAA >60 11/03/2019 1139    Lipid Panel     Component Value Date/Time   CHOL 129 12/26/2021 1009   TRIG 188 (H) 12/26/2021 1009   HDL 52 12/26/2021 1009   CHOLHDL 2.5 12/26/2021 1009   VLDL 53 (H) 08/31/2020 0922   LDLCALC 51 12/26/2021 1009    CBC    Component Value Date/Time   WBC 7.8 04/25/2021 0920   RBC 4.23 04/25/2021 0920   HGB 11.8 04/25/2021 0920   HCT 35.9 04/25/2021 0920   PLT 232 04/25/2021 0920   MCV 84.9 04/25/2021 0920   MCH 27.9 04/25/2021 0920   MCHC 32.9 04/25/2021 0920   RDW 14.6 04/25/2021 0920    Hgb A1C Lab Results  Component Value Date   HGBA1C 9.4 (A) 12/26/2021           Assessment & Plan:     RTC in 3 months, follow-up chronic conditions Webb Silversmith, NP

## 2022-03-27 NOTE — Assessment & Plan Note (Signed)
Continue rosuvastatin, Vascepa and aspirin Lipid profile today Encouraged her to consume low-fat diet

## 2022-03-27 NOTE — Assessment & Plan Note (Signed)
Continue carvedilol, Entresto, rosuvastatin, Vascepa and aspirin Lipid profile today Encouraged her to consume low-fat diet

## 2022-03-28 LAB — LIPID PANEL
Cholesterol: 128 mg/dL (ref <200)
HDL: 52 mg/dL (ref >=50)
LDL Cholesterol (Calc): 47 mg/dL (calc)
Non-HDL Cholesterol (Calc): 76 mg/dL (calc) (ref <130)
Total CHOL/HDL Ratio: 2.5 (calc) (ref <5.0)
Triglycerides: 232 mg/dL — ABNORMAL HIGH (ref <150)

## 2022-03-28 LAB — MICROALBUMIN / CREATININE URINE RATIO
Creatinine, Urine: 72 mg/dL (ref 20–275)
Microalb Creat Ratio: 126 mcg/mg creat — ABNORMAL HIGH (ref <30)
Microalb, Ur: 9.1 mg/dL

## 2022-04-08 ENCOUNTER — Other Ambulatory Visit (HOSPITAL_COMMUNITY): Payer: Self-pay | Admitting: Cardiology

## 2022-04-11 ENCOUNTER — Other Ambulatory Visit: Payer: Self-pay

## 2022-04-11 MED ORDER — ENTRESTO 49-51 MG PO TABS: 1.0000 | ORAL_TABLET | Freq: Two times a day (BID) | ORAL | 1 refills | Status: DC

## 2022-04-14 ENCOUNTER — Telehealth: Payer: Self-pay | Admitting: Internal Medicine

## 2022-04-14 NOTE — Telephone Encounter (Signed)
Left message for patient to call back and schedule Medicare Annual Wellness Visit (AWV) to be done virtually or by telephone.  No hx of AWV eligible as of 05/11/21  Please schedule at anytime with [facility]        Any questions, please call me at [phone]

## 2022-04-20 ENCOUNTER — Ambulatory Visit (INDEPENDENT_AMBULATORY_CARE_PROVIDER_SITE_OTHER): Payer: Medicare Other | Admitting: Dermatology

## 2022-04-20 DIAGNOSIS — L821 Other seborrheic keratosis: Secondary | ICD-10-CM

## 2022-04-20 DIAGNOSIS — L820 Inflamed seborrheic keratosis: Secondary | ICD-10-CM

## 2022-04-20 NOTE — Patient Instructions (Signed)
Cryotherapy Aftercare  Wash gently with soap and water everyday.   Apply Vaseline and Band-Aid daily until healed.     Due to recent changes in healthcare laws, you may see results of your pathology and/or laboratory studies on MyChart before the doctors have had a chance to review them. We understand that in some cases there may be results that are confusing or concerning to you. Please understand that not all results are received at the same time and often the doctors may need to interpret multiple results in order to provide you with the best plan of care or course of treatment. Therefore, we ask that you please give us 2 business days to thoroughly review all your results before contacting the office for clarification. Should we see a critical lab result, you will be contacted sooner.   If You Need Anything After Your Visit  If you have any questions or concerns for your doctor, please call our main line at [phone] and press option 4 to reach your doctor's medical assistant. If no one answers, please leave a voicemail as directed and we will return your call as soon as possible. Messages left after 4 pm will be answered the following business day.   You may also send us a message via MyChart. We typically respond to MyChart messages within 1-2 business days.  For prescription refills, please ask your pharmacy to contact our office. Our fax number is [phone].  If you have an urgent issue when the clinic is closed that cannot wait until the next business day, you can page your doctor at the number below.    Please note that while we do our best to be available for urgent issues outside of office hours, we are not available 24/7.   If you have an urgent issue and are unable to reach us, you may choose to seek medical care at your doctor's office, retail clinic, urgent care center, or emergency room.  If you have a medical emergency, please immediately call 911 or go to the  emergency department.  Pager Numbers  - Dr. Kowalski: [phone]  - Dr. Moye: [phone]  - Dr. Stewart: [phone]  In the event of inclement weather, please call our main line at [phone] for an update on the status of any delays or closures.  Dermatology Medication Tips: Please keep the boxes that topical medications come in in order to help keep track of the instructions about where and how to use these. Pharmacies typically print the medication instructions only on the boxes and not directly on the medication tubes.   If your medication is too expensive, please contact our office at [phone] option 4 or send us a message through MyChart.   We are unable to tell what your co-pay for medications will be in advance as this is different depending on your insurance coverage. However, we may be able to find a substitute medication at lower cost or fill out paperwork to get insurance to cover a needed medication.   If a prior authorization is required to get your medication covered by your insurance company, please allow us 1-2 business days to complete this process.  Drug prices often vary depending on where the prescription is filled and some pharmacies may offer cheaper prices.  The website www.goodrx.com contains coupons for medications through different pharmacies. The prices here do not account for what the cost may be with help from insurance (it may be cheaper with your insurance), but the website can   give you the price if you did not use any insurance.  - You can print the associated coupon and take it with your prescription to the pharmacy.  - You may also stop by our office during regular business hours and pick up a GoodRx coupon card.  - If you need your prescription sent electronically to a different pharmacy, notify our office through [place] MyChart or by phone at [phone] option 4.     Si Usted Necesita Algo Despus de Su Visita  Tambin puede  enviarnos un mensaje a travs de MyChart. Por lo general respondemos a los mensajes de MyChart en el transcurso de 1 a 2 das hbiles.  Para renovar recetas, por favor pida a su farmacia que se ponga en contacto con nuestra oficina. Nuestro nmero de fax es el [phone].  Si tiene un asunto urgente cuando la clnica est cerrada y que no puede esperar hasta el siguiente da hbil, puede llamar/localizar a su doctor(a) al nmero que aparece a continuacin.   Por favor, tenga en cuenta que aunque hacemos todo lo posible para estar disponibles para asuntos urgentes fuera del horario de oficina, no estamos disponibles las 24 horas del da, los 7 das de la semana.   Si tiene un problema urgente y no puede comunicarse con nosotros, puede optar por buscar atencin mdica  en el consultorio de su doctor(a), en una clnica privada, en un centro de atencin urgente o en una sala de emergencias.  Si tiene una emergencia mdica, por favor llame inmediatamente al 911 o vaya a la sala de emergencias.  Nmeros de bper  - Dr. Kowalski: [phone]  - Dra. Moye: [phone]  - Dra. Stewart: [phone]  En caso de inclemencias del tiempo, por favor llame a nuestra lnea principal al [phone] para una actualizacin sobre el estado de cualquier retraso o cierre.  Consejos para la medicacin en dermatologa: Por favor, guarde las cajas en las que vienen los medicamentos de uso tpico para ayudarle a seguir las instrucciones sobre dnde y cmo usarlos. Las farmacias generalmente imprimen las instrucciones del medicamento slo en las cajas y no directamente en los tubos del medicamento.   Si su medicamento es muy caro, por favor, pngase en contacto con nuestra oficina llamando al [phone] y presione la opcin 4 o envenos un mensaje a travs de MyChart.   No podemos decirle cul ser su copago por los medicamentos por adelantado ya que esto es diferente dependiendo de la cobertura de su seguro.  Sin embargo, es posible que podamos encontrar un medicamento sustituto a menor costo o llenar un formulario para que el seguro cubra el medicamento que se considera necesario.   Si se requiere una autorizacin previa para que su compaa de seguros cubra su medicamento, por favor permtanos de 1 a 2 das hbiles para completar este proceso.  Los precios de los medicamentos varan con frecuencia dependiendo del lugar de dnde se surte la receta y alguna farmacias pueden ofrecer precios ms baratos.  El sitio web www.goodrx.com tiene cupones para medicamentos de diferentes farmacias. Los precios aqu no tienen en cuenta lo que podra costar con la ayuda del seguro (puede ser ms barato con su seguro), pero el sitio web puede darle el precio si no utiliz ningn seguro.  - Puede imprimir el cupn correspondiente y llevarlo con su receta a la farmacia.  - Tambin puede pasar por nuestra oficina durante el horario de atencin regular y recoger una tarjeta de cupones de GoodRx.  -   Si necesita que su receta se enve electrnicamente a una farmacia diferente, informe a nuestra oficina a travs de MyChart de [place] o por telfono llamando al [phone] y presione la opcin 4.  

## 2022-04-20 NOTE — Progress Notes (Signed)
   Follow-Up Visit   Subjective  Monique Franklin is a 67 y.o. female who presents for the following: Other (Spots of arms and legs - concerned she may have psoriasis. Started after she was sick in November.). The patient has spots, moles and lesions to be evaluated, some may be new or changing and the patient has concerns that these could be cancer.  The following portions of the chart were reviewed this encounter and updated as appropriate:   Tobacco  Allergies  Meds  Problems  Med Hx  Surg Hx  Fam Hx     Review of Systems:  No other skin or systemic complaints except as noted in HPI or Assessment and Plan.  Objective  Well appearing patient in no apparent distress; mood and affect are within normal limits.  A focused examination was performed including arms, legs, neck. Relevant physical exam findings are noted in the Assessment and Plan.  Arms and Legs Stuck-on, waxy, tan-brown papules and plaques -- Discussed benign etiology and prognosis.   Left neck Erythematous stuck-on, waxy papule or plaque   Assessment & Plan  Seborrheic keratosis Arms and Legs  Benign-appearing.  Observation.  Call clinic for new or changing lesions.  Recommend daily use of broad spectrum spf 30+ sunscreen to sun-exposed areas.   May use OTC Hydrocortisone cream  Inflamed seborrheic keratosis Left neck  Destruction of lesion - Left neck Complexity: simple   Destruction method: cryotherapy   Informed consent: discussed and consent obtained   Timeout:  patient name, date of birth, surgical site, and procedure verified Lesion destroyed using liquid nitrogen: Yes   Region frozen until ice ball extended beyond lesion: Yes   Outcome: patient tolerated procedure well with no complications   Post-procedure details: wound care instructions given     Return if symptoms worsen or fail to improve.  I, Ashok Cordia, CMA, am acting as scribe for Sarina Ser, MD . Documentation: I have reviewed  the above documentation for accuracy and completeness, and I agree with the above.  Sarina Ser, MD

## 2022-04-25 ENCOUNTER — Ambulatory Visit: Payer: Medicare Other | Attending: Internal Medicine

## 2022-04-25 ENCOUNTER — Encounter: Payer: Self-pay | Admitting: Dermatology

## 2022-04-25 DIAGNOSIS — I255 Ischemic cardiomyopathy: Secondary | ICD-10-CM | POA: Diagnosis not present

## 2022-04-27 LAB — CUP PACEART REMOTE DEVICE CHECK
Battery Remaining Longevity: 67 mo
Battery Remaining Percentage: 65 %
Battery Voltage: 2.98 V
Brady Statistic RV Percent Paced: 1 %
Date Time Interrogation Session: 20240116211931
HighPow Impedance: 80 Ohm
HighPow Impedance: 80 Ohm
Implantable Lead Connection Status: 753985
Implantable Lead Implant Date: 20200707
Implantable Lead Location: 753860
Implantable Pulse Generator Implant Date: 20200707
Lead Channel Impedance Value: 690 Ohm
Lead Channel Pacing Threshold Amplitude: 0.5 V
Lead Channel Pacing Threshold Pulse Width: 0.5 ms
Lead Channel Sensing Intrinsic Amplitude: 11.7 mV
Lead Channel Setting Pacing Amplitude: 2.5 V
Lead Channel Setting Pacing Pulse Width: 0.5 ms
Lead Channel Setting Sensing Sensitivity: 0.5 mV
Pulse Gen Serial Number: 9816687

## 2022-05-01 ENCOUNTER — Ambulatory Visit: Payer: Medicare Other | Attending: Internal Medicine

## 2022-05-01 DIAGNOSIS — I255 Ischemic cardiomyopathy: Secondary | ICD-10-CM

## 2022-05-02 ENCOUNTER — Encounter: Payer: Self-pay | Admitting: Internal Medicine

## 2022-05-02 ENCOUNTER — Ambulatory Visit: Payer: Medicare Other | Attending: Internal Medicine | Admitting: Internal Medicine

## 2022-05-02 VITALS — BP 104/77 | HR 77 | Ht 63.0 in | Wt 178.0 lb

## 2022-05-02 DIAGNOSIS — I951 Orthostatic hypotension: Secondary | ICD-10-CM | POA: Diagnosis not present

## 2022-05-02 DIAGNOSIS — I255 Ischemic cardiomyopathy: Secondary | ICD-10-CM | POA: Diagnosis not present

## 2022-05-02 NOTE — Progress Notes (Signed)
Electrophysiology TeleHealth Note   Due to national recommendations of social distancing due to COVID 19, an audio/video telehealth visit is felt to be most appropriate for this patient at this time.  See MyChart message from today for the patient's consent to telehealth for [facility].   Date:  05/02/2022   ID:  Monique Franklin, DOB [birth year], MRN [id number]  Location: patient's home  Provider location: [address] [place]  Evaluation Performed: Follow-up visit  PCP:  Jearld Fenton, NP  Cardiologist:   BAE/DM  Electrophysiologist:  SK   Chief Complaint:   ICD  History of Present Illness:    Monique Franklin is a 67 y.o. female who presents via audio/video conferencing for a telehealth visit today.  Since last being seen in our clinic, in 2021 to establish care for ICD [employer]  implanted 2020 ([place]) for primary prevention in ICM, prior CABG, now followed by CHF the patient reports modest dyspnea and weakness if she walks to far Recent reduction in meds   concerns about meds and cost  Date Cr K Hgb  10/20 0.98 4.6 12.2  1/23   11.8   11/23 1.35<<1.67 4.2             DATE TEST EF   10/20 Echo  30%   11/22 Echo  35-40%                   Past Medical History:  Diagnosis Date   Cardiomyopathy ([place])    CHF (congestive heart failure) ([place])    Class I   Coronary artery disease    Diabetes mellitus without complication (HCC)    GERD (gastroesophageal reflux disease)    HFrEF (heart failure with reduced ejection fraction) (HCC)    Hyperlipidemia    Hypertension    MI (myocardial infarction) ([place])    PAD (peripheral artery disease) ([place])     Past Surgical History:  Procedure Laterality Date   BREAST BIOPSY Bilateral 2016   neg   CARDIAC CATHETERIZATION     CARDIAC DEFIBRILLATOR PLACEMENT     CATARACT EXTRACTION Bilateral    CATARACT EXTRACTION W/PHACO Right 11/09/2020   Procedure: CATARACT EXTRACTION PHACO AND INTRAOCULAR LENS  PLACEMENT ([place]) RIGHT VIVITY  DIABETIC;  Surgeon: Birder Robson, MD;  Location: [facility];  Service: Ophthalmology;  Laterality: Right;  2.85 00:33.2   CATARACT EXTRACTION W/PHACO Left 11/30/2020   Procedure: CATARACT EXTRACTION PHACO AND INTRAOCULAR LENS PLACEMENT ([facility]) LEFT DIABETIC VIVITY 3.86 00:28.3;  Surgeon: Birder Robson, MD;  Location: [facility];  Service: Ophthalmology;  Laterality: Left;   COLONOSCOPY WITH PROPOFOL N/A 10/31/2019   Procedure: COLONOSCOPY WITH PROPOFOL;  Surgeon: Virgel Manifold, MD;  Location: [facility] ENDOSCOPY;  Service: Endoscopy;  Laterality: N/A;  priority 4   COLONOSCOPY WITH PROPOFOL N/A 01/19/2022   Procedure: COLONOSCOPY WITH PROPOFOL;  Surgeon: Jonathon Bellows, MD;  Location[facility] ENDOSCOPY;  Service: Gastroenterology;  Laterality: N/A;   COMBINED AUGMENTATION MAMMAPLASTY AND ABDOMINOPLASTY     CORONARY ARTERY BYPASS GRAFT  08/2017   X2 with LIMA to LAD; 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     carvedilol (COREG) 25 MG tablet TAKE 1 TABLET BY MOUTH TWICE  DAILY 180 tablet 3   clobetasol ointment (TEMOVATE) 0.05 % Apply to affected area every night for 4 weeks, then every other day for 4 weeks and then twice a week for 4 weeks or until resolution. 30 g 5   FARXIGA 10 MG TABS tablet TAKE 1 TABLET BY MOUTH DAILY 90 tablet 3   glipiZIDE (GLUCOTROL) 10 MG tablet Take 1 tablet (10 mg total) by mouth 2 (two) times daily before a meal. 180 tablet 0   icosapent Ethyl (VASCEPA) 1 g capsule TAKE 2 CAPSULES BY MOUTH TWICE  DAILY 360 capsule 3   metFORMIN (GLUCOPHAGE) 1000 MG tablet  TAKE ONE-HALF TABLET BY MOUTH  TWICE DAILY WITH A MEAL 90 tablet 0   Multiple Vitamin (MULTIVITAMIN) tablet Take 1 tablet by mouth daily.     oxybutynin (DITROPAN-XL) 5 MG 24 hr tablet TAKE 1 TABLET(5 MG) BY MOUTH AT BEDTIME 90 tablet 3   rosuvastatin (CRESTOR) 40 MG tablet TAKE 1 TABLET BY MOUTH DAILY AT  6PM 90 tablet 3   sacubitril-valsartan (ENTRESTO) 49-51 MG Take 1 tablet by mouth 2 (two) times daily. 180 tablet 1   No current facility-administered medications for this visit.    Allergies:   Ticagrelor      ROS:  Please see the history of present illness.   All other systems are personally reviewed and negative.    Exam:    Vital Signs:  BP 104/77   Pulse 77   Ht 5\' 3"  (1.6 m)   Wt 178 lb (80.7 kg)   BMI 31.53 kg/m        Labs/Other Tests and Data Reviewed:    Recent Labs: 02/10/2022: BUN 25; Creatinine, Ser 1.39; Potassium 4.2; Sodium 139   Wt Readings from Last 3 Encounters:  05/02/22 178 lb (80.7 kg)  03/27/22 178 lb (80.7 kg)  03/20/22 178 lb 9.6 oz (81 kg)     Other studies personally reviewed: Additional studies/ records that were reviewed today include:    Review of the above records today demonstrates:      Last device remote is reviewed from [place] PDF dated 1/24 which reveals normal device function,   arrhythmias - none     ASSESSMENT & PLAN:    ICD-[employer]-primary prevention   Ischemic heart disease-prior stenting/CABG   Congestive heart failure-chronic-systolic class II   Orthostatic lightheadedness  Renal insufficiency gr 3   Lengthy discussion about medications and their role-- reviewed data for SGLT 2 , icosapent and entresto  Encouraged her to walk  with gradual increase    Follow-up:  12 m [place] Next remote: As Scheduled   Current medicines are reviewed at length with the patient today.   The patient has concerns regarding her medicines (As above) a  The following changes were made today:  none  Labs/ tests ordered today  include: none No orders of the defined types were placed in this encounter.   Future tests       Today, I have spent 38 minutes with the patient with telehealth technology discussing the above.  Signed, Virl Axe, MD  05/02/2022 9:54 AM     [facility] [place] [address] [phone] (office) [phone] (fax)

## 2022-05-02 NOTE — Patient Instructions (Signed)
Medication Instructions:  - Your physician recommends that you continue on your current medications as directed. Please refer to the Current Medication list given to you today.  *If you need a refill on your cardiac medications before your next appointment, please call your pharmacy*   Lab Work: - none ordered  If you have labs (blood work) drawn today and your tests are completely normal, you will receive your results only by: MyChart Message (if you have MyChart) OR A paper copy in the mail If you have any lab test that is abnormal or we need to change your treatment, we will call you to review the results.   Testing/Procedures: - none ordered   Follow-Up: At [facility], you and your health needs are our priority.  As part of our continuing mission to provide you with exceptional heart care, we have created designated Provider Care Teams.  These Care Teams include your primary Cardiologist (physician) and Advanced Practice Providers (APPs -  Physician Assistants and Nurse Practitioners) who all work together to provide you with the care you need, when you need it.  We recommend signing up for the patient portal called "MyChart".  Sign up information is provided on this After Visit Summary.  MyChart is used to connect with patients for Virtual Visits (Telemedicine).  Patients are able to view lab/test results, encounter notes, upcoming appointments, etc.  Non-urgent messages can be sent to your provider as well.   To learn more about what you can do with MyChart, go to [email or web address].    Your next appointment:   1 year(s)  Provider:   Steven Klein, MD    Other Instructions N/a  

## 2022-05-03 LAB — CUP PACEART REMOTE DEVICE CHECK
Battery Remaining Longevity: 67 mo
Battery Remaining Percentage: 65 %
Battery Voltage: 2.98 V
Brady Statistic RV Percent Paced: 1 %
Date Time Interrogation Session: 20240122020017
HighPow Impedance: 79 Ohm
HighPow Impedance: 79 Ohm
Implantable Lead Connection Status: 753985
Implantable Lead Implant Date: 20200707
Implantable Lead Location: 753860
Implantable Pulse Generator Implant Date: 20200707
Lead Channel Impedance Value: 650 Ohm
Lead Channel Pacing Threshold Amplitude: 0.5 V
Lead Channel Pacing Threshold Pulse Width: 0.5 ms
Lead Channel Sensing Intrinsic Amplitude: 11.7 mV
Lead Channel Setting Pacing Amplitude: 2.5 V
Lead Channel Setting Pacing Pulse Width: 0.5 ms
Lead Channel Setting Sensing Sensitivity: 0.5 mV
Pulse Gen Serial Number: 9816687

## 2022-05-04 ENCOUNTER — Other Ambulatory Visit: Payer: Self-pay | Admitting: Internal Medicine

## 2022-05-04 DIAGNOSIS — E1169 Type 2 diabetes mellitus with other specified complication: Secondary | ICD-10-CM

## 2022-05-05 NOTE — Telephone Encounter (Signed)
D/c'd 03/27/2022 - not on med list. Requested Prescriptions  Pending Prescriptions Disp Refills   glimepiride (AMARYL) 4 MG tablet [Pharmacy Med Name: Glimepiride 4 MG Oral Tablet] 180 tablet 3    Sig: TAKE 1 TABLET BY MOUTH IN THE  MORNING AND AT BEDTIME     Endocrinology:  Diabetes - Sulfonylureas Failed - 05/04/2022 11:39 PM      Failed - HBA1C is between 0 and 7.9 and within 180 days    Hemoglobin A1C  Date Value Ref Range Status  12/26/2021 9.4 (A) 4.0 - 5.6 % Final   Hgb A1c MFr Bld  Date Value Ref Range Status  04/25/2021 8.6 (H) <5.7 % of total Hgb Final    Comment:    For someone without known diabetes, a hemoglobin A1c value of 6.5% or greater indicates that they may have  diabetes and this should be confirmed with a follow-up  test. . For someone with known diabetes, a value <7% indicates  that their diabetes is well controlled and a value  greater than or equal to 7% indicates suboptimal  control. A1c targets should be individualized based on  duration of diabetes, age, comorbid conditions, and  other considerations. . Currently, no consensus exists regarding use of hemoglobin A1c for diagnosis of diabetes for children. .          Failed - Cr in normal range and within 360 days    Creat  Date Value Ref Range Status  04/25/2021 1.09 (H) 0.50 - 1.05 mg/dL Final   Creatinine, Ser  Date Value Ref Range Status  02/10/2022 1.39 (H) 0.44 - 1.00 mg/dL Final   Creatinine, Urine  Date Value Ref Range Status  03/27/2022 72 20 - 275 mg/dL Final         Passed - Valid encounter within last 6 months    Recent Outpatient Visits           1 month ago Estrogen deficiency   [facility] [place], [place] W, NP   4 months ago Type 2 diabetes mellitus with other specified complication, without long-term current use of insulin [facility])   [facility] [place], [place] W, NP   1 year ago Type 2 diabetes mellitus with other  specified complication, without long-term current use of insulin [facility])   [facility] [place], Coralie Keens, NP   1 year ago Influenza vaccine administered   [facility] [place], [place] W, NP   1 year ago Type 2 diabetes mellitus with other specified complication, without long-term current use of insulin [facility])   [facility] [place], Coralie Keens, NP       Future Appointments             In 4 months Agbor-Etang, Aaron Edelman, MD [place] at [facility]

## 2022-05-16 ENCOUNTER — Other Ambulatory Visit: Payer: Self-pay | Admitting: Internal Medicine

## 2022-05-17 NOTE — Telephone Encounter (Signed)
Requested medication (s) are due for refill today: yes  Requested medication (s) are on the active medication list: yes  Last refill:  03/17/22 #90 0 refills  Future visit scheduled: no  Notes to clinic:  protocol failed. Last labs 04/25/21. No refills remain. Do you want to refill Rx? Requesting 1 year supply     Requested Prescriptions  Pending Prescriptions Disp Refills   metFORMIN (GLUCOPHAGE) 1000 MG tablet [Pharmacy Med Name: metFORMIN HCl 1000 MG Oral Tablet] 90 tablet 3    Sig: TAKE ONE-HALF TABLET BY MOUTH  TWICE DAILY WITH A MEAL     Endocrinology:  Diabetes - Biguanides Failed - 05/16/2022 11:25 PM      Failed - Cr in normal range and within 360 days    Creat  Date Value Ref Range Status  04/25/2021 1.09 (H) 0.50 - 1.05 mg/dL Final   Creatinine, Ser  Date Value Ref Range Status  02/10/2022 1.39 (H) 0.44 - 1.00 mg/dL Final   Creatinine, Urine  Date Value Ref Range Status  03/27/2022 72 20 - 275 mg/dL Final         Failed - HBA1C is between 0 and 7.9 and within 180 days    Hemoglobin A1C  Date Value Ref Range Status  12/26/2021 9.4 (A) 4.0 - 5.6 % Final   Hgb A1c MFr Bld  Date Value Ref Range Status  04/25/2021 8.6 (H) <5.7 % of total Hgb Final    Comment:    For someone without known diabetes, a hemoglobin A1c value of 6.5% or greater indicates that they may have  diabetes and this should be confirmed with a follow-up  test. . For someone with known diabetes, a value <7% indicates  that their diabetes is well controlled and a value  greater than or equal to 7% indicates suboptimal  control. A1c targets should be individualized based on  duration of diabetes, age, comorbid conditions, and  other considerations. . Currently, no consensus exists regarding use of hemoglobin A1c for diagnosis of diabetes for children. .          Failed - eGFR in normal range and within 360 days    GFR calc Af Amer  Date Value Ref Range Status  11/03/2019 >60 >60 mL/min  Final   GFR, Estimated  Date Value Ref Range Status  02/10/2022 42 (L) >60 mL/min Final    Comment:    (NOTE) Calculated using the CKD-EPI Creatinine Equation (2021)    GFR  Date Value Ref Range Status  05/12/2020 53.53 (L) >60.00 mL/min Final    Comment:    Calculated using the CKD-EPI Creatinine Equation (2021)   eGFR  Date Value Ref Range Status  04/25/2021 56 (L) > OR = 60 mL/min/1.63m2 Final    Comment:    The eGFR is based on the CKD-EPI 2021 equation. To calculate  the new eGFR from a previous Creatinine or Cystatin C result, go to https://www.kidney.org/professionals/ kdoqi/gfr%5Fcalculator          Failed - B12 Level in normal range and within 720 days    No results found for: "VITAMINB12"       Failed - CBC within normal limits and completed in the last 12 months    WBC  Date Value Ref Range Status  04/25/2021 7.8 3.8 - 10.8 Thousand/uL Final   RBC  Date Value Ref Range Status  04/25/2021 4.23 3.80 - 5.10 Million/uL Final   Hemoglobin  Date Value Ref Range Status  04/25/2021 11.8  11.7 - 15.5 g/dL Final   HCT  Date Value Ref Range Status  04/25/2021 35.9 35.0 - 45.0 % Final   MCHC  Date Value Ref Range Status  04/25/2021 32.9 32.0 - 36.0 g/dL Final   [facility]  Date Value Ref Range Status  04/25/2021 27.9 27.0 - 33.0 pg Final   MCV  Date Value Ref Range Status  04/25/2021 84.9 80.0 - 100.0 fL Final   No results found for: "PLTCOUNTKUC", "LABPLAT", "POCPLA" RDW  Date Value Ref Range Status  04/25/2021 14.6 11.0 - 15.0 % Final         Passed - Valid encounter within last 6 months    Recent Outpatient Visits           1 month ago Estrogen deficiency   [facility] [place], Coralie Keens, NP   4 months ago Type 2 diabetes mellitus with other specified complication, without long-term current use of insulin [facility])   [facility] [place], [place] W, NP   1 year ago Type 2 diabetes mellitus with other  specified complication, without long-term current use of insulin [facility])   [facility] [place], Coralie Keens, NP   1 year ago Influenza vaccine administered   [facility] [place], [place] W, NP   1 year ago Type 2 diabetes mellitus with other specified complication, without long-term current use of insulin [facility])   [facility] [place], Coralie Keens, NP       Future Appointments             In 4 months Agbor-Etang, Aaron Edelman, MD [place] at [facility]

## 2022-05-18 ENCOUNTER — Other Ambulatory Visit: Payer: Self-pay | Admitting: Internal Medicine

## 2022-05-18 DIAGNOSIS — E1169 Type 2 diabetes mellitus with other specified complication: Secondary | ICD-10-CM

## 2022-05-18 NOTE — Telephone Encounter (Signed)
Requested Prescriptions  Pending Prescriptions Disp Refills   glimepiride (AMARYL) 4 MG tablet [Pharmacy Med Name: Glimepiride 4 MG Oral Tablet] 180 tablet 3    Sig: TAKE 1 TABLET BY MOUTH IN THE  MORNING AND AT BEDTIME     Endocrinology:  Diabetes - Sulfonylureas Failed - 05/18/2022  5:21 AM      Failed - HBA1C is between 0 and 7.9 and within 180 days    Hemoglobin A1C  Date Value Ref Range Status  12/26/2021 9.4 (A) 4.0 - 5.6 % Final   Hgb A1c MFr Bld  Date Value Ref Range Status  04/25/2021 8.6 (H) <5.7 % of total Hgb Final    Comment:    For someone without known diabetes, a hemoglobin A1c value of 6.5% or greater indicates that they may have  diabetes and this should be confirmed with a follow-up  test. . For someone with known diabetes, a value <7% indicates  that their diabetes is well controlled and a value  greater than or equal to 7% indicates suboptimal  control. A1c targets should be individualized based on  duration of diabetes, age, comorbid conditions, and  other considerations. . Currently, no consensus exists regarding use of hemoglobin A1c for diagnosis of diabetes for children. .          Failed - Cr in normal range and within 360 days    Creat  Date Value Ref Range Status  04/25/2021 1.09 (H) 0.50 - 1.05 mg/dL Final   Creatinine, Ser  Date Value Ref Range Status  02/10/2022 1.39 (H) 0.44 - 1.00 mg/dL Final   Creatinine, Urine  Date Value Ref Range Status  03/27/2022 72 20 - 275 mg/dL Final         Passed - Valid encounter within last 6 months    Recent Outpatient Visits           1 month ago Estrogen deficiency   [facility] [place], [place] W, NP   4 months ago Type 2 diabetes mellitus with other specified complication, without long-term current use of insulin [facility]   [facility] [place], [place] W, NP   1 year ago Type 2 diabetes mellitus with other specified complication, without long-term  current use of insulin [facility])   [facility] [place], [place]alie Keens, NP   1 year ago Influenza vaccine administered   [facility] [place], [place] W, NP   1 year ago Type 2 diabetes mellitus with other specified complication, without long-term current use of insulin [facility])   [facility] [place], Coralie Keens, NP       Future Appointments             In 4 months Agbor-Etang, Aaron Edelman, MD [place] [facility]

## 2022-05-19 NOTE — Progress Notes (Signed)
Remote ICD transmission.   

## 2022-05-31 ENCOUNTER — Encounter: Payer: Self-pay | Admitting: Internal Medicine

## 2022-05-31 ENCOUNTER — Ambulatory Visit (INDEPENDENT_AMBULATORY_CARE_PROVIDER_SITE_OTHER): Payer: Medicare Other | Admitting: Internal Medicine

## 2022-05-31 VITALS — BP 126/82 | HR 76 | Temp 96.8°F | Wt 180.0 lb

## 2022-05-31 DIAGNOSIS — I255 Ischemic cardiomyopathy: Secondary | ICD-10-CM | POA: Diagnosis not present

## 2022-05-31 DIAGNOSIS — R21 Rash and other nonspecific skin eruption: Secondary | ICD-10-CM

## 2022-05-31 NOTE — Patient Instructions (Signed)
Psoriasis Psoriasis is a long-term (chronic) skin condition. It causes raised, red patches (plaques) that look silvery on your skin. The patches may be on all areas of your body and can be any size or shape. Symptoms can range from mild to very bad. This condition cannot be passed from one person to another (is not contagious). What are the causes? The exact cause of psoriasis is not known. It occurs because the body's defense system (immune system) attacks healthy skin. This causes the patches. Some things can make the condition worse. These are: Skin damage, such as cuts, scrapes, sunburn, and dryness. Not getting enough sunlight. Some medicines. Alcohol. Tobacco. Stress. Infections. What increases the risk? You are more likely to develop this condition if you: Have a family member with psoriasis. Are very overweight (obese). Are 6-70 years old. Take certain medicines. What are the signs or symptoms? There are different types of psoriasis. You can have more than one type. The types are: Plaque. This is the most common. Symptoms include red, raised patches with a silvery coating. These may be itchy. Your nails may be crumbly or fall off. Guttate. Symptoms include small red spots on your stomach area, arms, and legs. These may happen after you have been sick, especially with strep throat. Inverse. Symptoms include patches in your armpits, under your breasts, private areas, or on your butt. Pustular. Symptoms include swollen, red, pus-filled bumps that hurt on the palms of your hands or the soles of your feet. You also may feel very tired, weak, have a fever, and not be hungry. Erythrodermic. Symptoms include bright red skin that looks burned. You may have a fast heartbeat and a body temperature that is too high or too low. You may be itchy or in pain. Sebopsoriasis. Symptoms include red patches on your scalp, forehead, and face that are greasy. Psoriatic arthritis. Symptoms include  swollen, painful joints along with scaly skin patches. Your nails may be crumbly or fall off. There are times when symptoms may get worse (flares). How is this treated? There is no cure for this condition, but treatment can: Help your skin heal. Help with itching. Help with irritation and swelling (inflammation). Slow the growth of new skin cells. Help your body's defense system respond better to your skin. Help with any related conditions. Psoriasis can increase your risk of other conditions, such as: Heart disease. High blood pressure. Eye problems. Depression. Treatment may include: Creams or ointments. Light therapy. This may include natural sunlight or light therapy in a doctor's office. Medicines. These can help your body better manage skin cells. They may be used with light therapy or ointments. Medicines may include pills or injections. You may also get antibiotic medicines if you have an infection. These may include: Systemic therapy medicines. These medicines: Can help your body manage how the skin cells grow. Can help with irritation and swelling. May be given as pills or injections. Biologic medicines. These medicines: Are usually given through an IV or as injections. Are helpful for people with very bad symptoms. Have a higher risk of infection. Follow these instructions at home: Skin Care Use lotion on your skin as needed. Only use lotions that your doctor has said are okay. Put cool, wet cloths (cold compresses) on the affected areas. Do not use a hot tub or take hot showers. Use slightly warm water when taking showers and baths. Do not scratch your skin. Lifestyle Keep a healthy weight. Do not eat a lot of foods that have  a lot of solid fats, added sugars, or salt in them. Eat a healthy diet that includes lots of: Vegetables. Fruit. Whole grains. Low-fat dairy products. Lean proteins. Do not smoke or use any products that contain nicotine or tobacco. If you  need help quitting, ask your doctor. Lower your stress. Go out in the sun as told by your doctor. Do not get sunburned. Join a support group. General instructions  Take or use over-the-counter and prescription medicines only as told by your doctor. Track the things that cause symptoms (triggers). Try to avoid these things. Do not drink alcohol if your doctor tells you not to drink. See a counselor if you feel the support would help. Keep all follow-up visits. Your doctor will check on your condition to make sure it does not get worse or cause problems. Where to find [place]: www.psoriasis.org Where to find more information American Academy of Dermatology: http://jones-macias.info/ Contact a doctor if: You have a fever or chills. Your signs or symptoms get worse. You have more redness or warmth in the affected areas. You have new or worse pain or stiffness in your joints. Your nails break easily or pull away from the nail bed. You feel very sad (depressed), frustrated, or hopeless. Get help right away if: You have thoughts of hurting yourself or others. Get help right away if you feel like you may hurt yourself or others, or have thoughts about taking your own life. Go to your nearest emergency room or: Call 911. Call the [place] at (469) 296-5478 or 988. This is open 24 hours a day. Text the Crisis Text Line at 367 676 8854. Summary Psoriasis is a long-term (chronic) skin condition. There is no cure for this condition, but treatment can help. Track the things that cause symptoms. Take or use over-the-counter and prescription medicines only as told by your doctor. Keep all follow-up visits. This information is not intended to replace advice given to you by your health care provider. Make sure you discuss any questions you have with your health care provider. Document Revised: 06/01/2021 Document Reviewed: 06/01/2021 Elsevier Patient Education   [place].

## 2022-05-31 NOTE — Progress Notes (Signed)
Subjective:    Patient ID: Monique Franklin, female    DOB: May 30, 1955, 67 y.o.   MRN: [id number]  HPI  Patient presents to clinic today with complaint of rashes.  She has noticed this on her upper and lower extremities.  The rash itches.  It seems scaly.  She denies associated joint swelling.  She denies changes in medications, diet, soaps, lotions or detergents.  She has tried Clobetasol OTC with minimal relief of symptoms.  She has seen her dermatologist for the same and was advised that it was "nothing".  She reports that she has a second opinion with a dermatologist scheduled in March 2024.   She has no family history of lupus that she is aware of.  She does have a family history of psoriasis.  Review of Systems     Past Medical History:  Diagnosis Date   Cardiomyopathy ([place])    CHF (congestive heart failure) (HCC)    Class I   Coronary artery disease    Diabetes mellitus without complication (HCC)    GERD (gastroesophageal reflux disease)    HFrEF (heart failure with reduced ejection fraction) (HCC)    Hyperlipidemia    Hypertension    MI (myocardial infarction) ([place])    PAD (peripheral artery disease) (HCC)     Current Outpatient Medications  Medication Sig Dispense Refill   aspirin EC 81 MG tablet Take 81 mg by mouth daily.     carvedilol (COREG) 25 MG tablet TAKE 1 TABLET BY MOUTH TWICE  DAILY 180 tablet 3   clobetasol ointment (TEMOVATE) 0.05 % Apply to affected area every night for 4 weeks, then every other day for 4 weeks and then twice a week for 4 weeks or until resolution. 30 g 5   FARXIGA 10 MG TABS tablet TAKE 1 TABLET BY MOUTH DAILY 90 tablet 3   glipiZIDE (GLUCOTROL) 10 MG tablet Take 1 tablet (10 mg total) by mouth 2 (two) times daily before a meal. 180 tablet 0   icosapent Ethyl (VASCEPA) 1 g capsule TAKE 2 CAPSULES BY MOUTH TWICE  DAILY 360 capsule 3   metFORMIN (GLUCOPHAGE) 1000 MG tablet TAKE ONE-HALF TABLET BY MOUTH  TWICE DAILY WITH A MEAL 90 tablet 0    Multiple Vitamin (MULTIVITAMIN) tablet Take 1 tablet by mouth daily.     oxybutynin (DITROPAN-XL) 5 MG 24 hr tablet TAKE 1 TABLET(5 MG) BY MOUTH AT BEDTIME 90 tablet 3   rosuvastatin (CRESTOR) 40 MG tablet TAKE 1 TABLET BY MOUTH DAILY AT  6PM 90 tablet 3   sacubitril-valsartan (ENTRESTO) 49-51 MG Take 1 tablet by mouth 2 (two) times daily. 180 tablet 1   No current facility-administered medications for this visit.    Allergies  Allergen Reactions   Ticagrelor Rash    Family History  Problem Relation Age of Onset   Heart attack Mother    COPD Father    Thyroid cancer Sister    Breast cancer Sister 53   Diabetes Sister    Diabetes Brother     Social History   Socioeconomic History   Marital status: Widowed    Spouse name: Not on file   Number of children: Not on file   Years of education: Not on file   Highest education level: Not on file  Occupational History   Not on file  Tobacco Use   Smoking status: Former    Packs/day: 1.00    Years: 33.00    Total pack years: 33.00  Types: Cigarettes    Quit date: 2005    Years since quitting: 19.1   Smokeless tobacco: Never  Vaping Use   Vaping Use: Never used  Substance and Sexual Activity   Alcohol use: Yes    Comment: occasional   Drug use: Never   Sexual activity: Not Currently    Birth control/protection: Post-menopausal  Other Topics Concern   Not on file  Social History Narrative   Not on file   Social Determinants of Health   Financial Resource Strain: Not on file  Food Insecurity: Not on file  Transportation Needs: Not on file  Physical Activity: Not on file  Stress: Not on file  Social Connections: Not on file  Intimate Partner Violence: Not on file     Constitutional: Denies fever, malaise, fatigue, headache or abrupt weight changes.  Respiratory: Denies difficulty breathing, shortness of breath, cough or sputum production.   Cardiovascular: Denies chest pain, chest tightness, palpitations or  swelling in the hands or feet.  Musculoskeletal: Denies decrease in range of motion, difficulty with gait, muscle pain or joint pain and swelling.  Skin: Patient reports rashes.  Denies lesions or ulcercations.  Neurological: Denies dizziness, difficulty with memory, difficulty with speech or problems   No other specific complaints in a complete review of systems (except as listed in HPI above).  Objective:   Physical Exam   BP 126/82 (BP Location: Right Arm, Patient Position: Sitting, Cuff Size: Normal)   Pulse 76   Temp (!) 96.8 F (36 C) (Temporal)   Wt 180 lb (81.6 kg)   SpO2 98%   BMI 31.89 kg/m  Wt Readings from Last 3 Encounters:  05/31/22 180 lb (81.6 kg)  05/02/22 178 lb (80.7 kg)  03/27/22 178 lb (80.7 kg)    General: Appears her stated age, obese, in NAD. Skin: Warm, dry and intact.  Scattered, irregularly shaped scaly lesions on erythematous base noted of bilateral upper and lower extremities. HEENT: Head: normal shape and size; Eyes: sclera white, no icterus, conjunctiva pink, PERRLA and EOMs intact;  Cardiovascular: Normal rate and rhythm. S1,S2 noted.  No murmur, rubs or gallops noted.  Pulmonary/Chest: Normal effort and positive vesicular breath sounds. No respiratory distress. No wheezes, rales or ronchi noted.  Musculoskeletal:  No signs of joint swelling. No difficulty with gait.  Neurological: Alert and oriented.    BMET    Component Value Date/Time   NA 139 02/10/2022 0928   NA 139 06/12/2019 0840   K 4.2 02/10/2022 0928   CL 109 02/10/2022 0928   CO2 22 02/10/2022 0928   GLUCOSE 253 (H) 02/10/2022 0928   BUN 25 (H) 02/10/2022 0928   BUN 28 (H) 06/12/2019 0840   CREATININE 1.39 (H) 02/10/2022 0928   CREATININE 1.09 (H) 04/25/2021 0920   CALCIUM 9.2 02/10/2022 0928   GFRNONAA 42 (L) 02/10/2022 0928   GFRAA >60 11/03/2019 1139    Lipid Panel     Component Value Date/Time   CHOL 128 03/27/2022 0905   TRIG 232 (H) 03/27/2022 0905   HDL 52  03/27/2022 0905   CHOLHDL 2.5 03/27/2022 0905   VLDL 53 (H) 08/31/2020 0922   LDLCALC 47 03/27/2022 0905    CBC    Component Value Date/Time   WBC 7.8 04/25/2021 0920   RBC 4.23 04/25/2021 0920   HGB 11.8 04/25/2021 0920   HCT 35.9 04/25/2021 0920   PLT 232 04/25/2021 0920   MCV 84.9 04/25/2021 0920   MCH 27.9 04/25/2021 0920  MCHC 32.9 04/25/2021 0920   RDW 14.6 04/25/2021 0920    Hgb A1C Lab Results  Component Value Date   HGBA1C 9.4 (A) 12/26/2021           Assessment & Plan:   Rash and Non Specific Skin Eruption:  Does have a psoriatic appearance We will check ANA, ESR, CRP, C3/C4, anti-double-stranded DNA and anti-smooth muscle antibodies Continue Clobetasol for now I do not want to give her prednisone given her history of uncontrolled diabetes She will keep her follow-up appointment with her dermatologist for second opinion  RTC in 1 month for follow-up of chronic conditions Webb Silversmith, NP

## 2022-06-01 ENCOUNTER — Other Ambulatory Visit (HOSPITAL_COMMUNITY): Payer: Self-pay

## 2022-06-02 LAB — ANA: Anti Nuclear Antibody (ANA): POSITIVE — AB

## 2022-06-02 LAB — ANTI-NUCLEAR AB-TITER (ANA TITER): ANA Titer 1: 1:40 {titer} — ABNORMAL HIGH

## 2022-06-02 LAB — C3 AND C4
C3 Complement: 161 mg/dL (ref 83–193)
C4 Complement: 35 mg/dL (ref 15–57)

## 2022-06-02 LAB — ANTI-DNA ANTIBODY, DOUBLE-STRANDED: ds DNA Ab: 8 IU/mL — ABNORMAL HIGH

## 2022-06-02 LAB — ANTI-SMITH ANTIBODY: ENA SM Ab Ser-aCnc: <1 AI

## 2022-06-02 LAB — C-REACTIVE PROTEIN: CRP: 1.3 mg/L (ref <8.0)

## 2022-06-02 LAB — SEDIMENTATION RATE: Sed Rate: 39 mm/h — ABNORMAL HIGH (ref 0–30)

## 2022-06-05 ENCOUNTER — Encounter: Payer: Self-pay | Admitting: Internal Medicine

## 2022-06-05 DIAGNOSIS — Z9289 Personal history of other medical treatment: Secondary | ICD-10-CM

## 2022-06-05 DIAGNOSIS — R21 Rash and other nonspecific skin eruption: Secondary | ICD-10-CM

## 2022-06-05 DIAGNOSIS — R768 Other specified abnormal immunological findings in serum: Secondary | ICD-10-CM

## 2022-06-14 ENCOUNTER — Telehealth: Payer: Self-pay | Admitting: Internal Medicine

## 2022-06-14 NOTE — Telephone Encounter (Signed)
Contacted Stefannie Messerli to schedule their annual wellness visit. Appointment made for 07/13/2022.  Sherol Dade; Care Guide Ambulatory Clinical [facility] Direct Dial: [phone]

## 2022-06-19 NOTE — Addendum Note (Signed)
Addended by: Douglass Rivers D on: 06/19/2022 03:08 PM   Modules accepted: Level of Service

## 2022-06-19 NOTE — Progress Notes (Signed)
Remote ICD transmission.   

## 2022-06-27 ENCOUNTER — Other Ambulatory Visit (HOSPITAL_COMMUNITY): Payer: Self-pay

## 2022-07-13 ENCOUNTER — Ambulatory Visit (INDEPENDENT_AMBULATORY_CARE_PROVIDER_SITE_OTHER): Payer: Medicare Other

## 2022-07-13 VITALS — Ht 63.0 in | Wt 180.0 lb

## 2022-07-13 DIAGNOSIS — Z0000 Encounter for general adult medical examination without abnormal findings: Secondary | ICD-10-CM

## 2022-07-13 NOTE — Patient Instructions (Signed)
Monique Franklin , Thank you for taking time to come for your Medicare Wellness Visit. I appreciate your ongoing commitment to your health goals. Please review the following plan we discussed and let me know if I can assist you in the future.   These are the goals we discussed:  Goals      DIET - EAT MORE FRUITS AND VEGETABLES        This is a list of the screening recommended for you and due dates:  Health Maintenance  Topic Date Due   Hepatitis C Screening: USPSTF Recommendation to screen - Ages 35-79 yo.  Never done   DTaP/Tdap/Td vaccine (1 - Tdap) Never done   DEXA scan (bone density measurement)  Never done   Hemoglobin A1C  06/26/2022   COVID-19 Vaccine (4 - 2023-24 season) 12/10/2022*   Eye exam for diabetics  07/30/2022   Flu Shot  11/09/2022   Complete foot exam   12/27/2022   Yearly kidney function blood test for diabetes  02/11/2023   Yearly kidney health urinalysis for diabetes  03/28/2023   Mammogram  06/01/2023   Medicare Annual Wellness Visit  07/13/2023   Colon Cancer Screening  01/20/2027   Pneumonia Vaccine  Completed   Zoster (Shingles) Vaccine  Completed   HPV Vaccine  Aged Out  *Topic was postponed. The date shown is not the original due date.    Advanced directives: no  Conditions/risks identified: none  Next appointment: Follow up in one year for your annual wellness visit 07/19/23 @ 2:00 pm by phone   Preventive Care 65 Years and Older, Female Preventive care refers to lifestyle choices and visits with your health care provider that can promote health and wellness. What does preventive care include? A yearly physical exam. This is also called an annual well check. Dental exams once or twice a year. Routine eye exams. Ask your health care provider how often you should have your eyes checked. Personal lifestyle choices, including: Daily care of your teeth and gums. Regular physical activity. Eating a healthy diet. Avoiding tobacco and drug  use. Limiting alcohol use. Practicing safe sex. Taking low-dose aspirin every day. Taking vitamin and mineral supplements as recommended by your health care provider. What happens during an annual well check? The services and screenings done by your health care provider during your annual well check will depend on your age, overall health, lifestyle risk factors, and family history of disease. Counseling  Your health care provider may ask you questions about your: Alcohol use. Tobacco use. Drug use. Emotional well-being. Home and relationship well-being. Sexual activity. Eating habits. History of falls. Memory and ability to understand (cognition). Work and work Statistician. Reproductive health. Screening  You may have the following tests or measurements: Height, weight, and BMI. Blood pressure. Lipid and cholesterol levels. These may be checked every 5 years, or more frequently if you are over 23 years old. Skin check. Lung cancer screening. You may have this screening every year starting at age 67 if you have a 30-pack-year history of smoking and currently smoke or have quit within the past 15 years. Fecal occult blood test (FOBT) of the stool. You may have this test every year starting at age 63. Flexible sigmoidoscopy or colonoscopy. You may have a sigmoidoscopy every 5 years or a colonoscopy every 10 years starting at age 73. Hepatitis C blood test. Hepatitis B blood test. Sexually transmitted disease (STD) testing. Diabetes screening. This is done by checking your blood sugar (glucose) after  you have not eaten for a while (fasting). You may have this done every 1-3 years. Bone density scan. This is done to screen for osteoporosis. You may have this done starting at age 24. Mammogram. This may be done every 1-2 years. Talk to your health care provider about how often you should have regular mammograms. Talk with your health care provider about your test results, treatment  options, and if necessary, the need for more tests. Vaccines  Your health care provider may recommend certain vaccines, such as: Influenza vaccine. This is recommended every year. Tetanus, diphtheria, and acellular pertussis (Tdap, Td) vaccine. You may need a Td booster every 10 years. Zoster vaccine. You may need this after age 55. Pneumococcal 13-valent conjugate (PCV13) vaccine. One dose is recommended after age 50. Pneumococcal polysaccharide (PPSV23) vaccine. One dose is recommended after age 27. Talk to your health care provider about which screenings and vaccines you need and how often you need them. This information is not intended to replace advice given to you by your health care provider. Make sure you discuss any questions you have with your health care provider. Document Released: 04/23/2015 Document Revised: 12/15/2015 Document Reviewed: 01/26/2015 Elsevier Interactive Patient Education  2017 [place] Prevention in the Home Falls can cause injuries. They can happen to people of all ages. There are many things you can do to make your home safe and to help prevent falls. What can I do on the outside of my home? Regularly fix the edges of walkways and driveways and fix any cracks. Remove anything that might make you trip as you walk through a door, such as a raised step or threshold. Trim any bushes or trees on the path to your home. Use bright outdoor lighting. Clear any walking paths of anything that might make someone trip, such as rocks or tools. Regularly check to see if handrails are loose or broken. Make sure that both sides of any steps have handrails. Any raised decks and porches should have guardrails on the edges. Have any leaves, snow, or ice cleared regularly. Use sand or salt on walking paths during winter. Clean up any spills in your garage right away. This includes oil or grease spills. What can I do in the bathroom? Use night lights. Install grab  bars by the toilet and in the tub and shower. Do not use towel bars as grab bars. Use non-skid mats or decals in the tub or shower. If you need to sit down in the shower, use a plastic, non-slip stool. Keep the floor dry. Clean up any water that spills on the floor as soon as it happens. Remove soap buildup in the tub or shower regularly. Attach bath mats securely with double-sided non-slip rug tape. Do not have throw rugs and other things on the floor that can make you trip. What can I do in the bedroom? Use night lights. Make sure that you have a light by your bed that is easy to reach. Do not use any sheets or blankets that are too big for your bed. They should not hang down onto the floor. Have a firm chair that has side arms. You can use this for support while you get dressed. Do not have throw rugs and other things on the floor that can make you trip. What can I do in the kitchen? Clean up any spills right away. Avoid walking on wet floors. Keep items that you use a lot in easy-to-reach places. If you  need to reach something above you, use a strong step stool that has a grab bar. Keep electrical cords out of the way. Do not use floor polish or wax that makes floors slippery. If you must use wax, use non-skid floor wax. Do not have throw rugs and other things on the floor that can make you trip. What can I do with my stairs? Do not leave any items on the stairs. Make sure that there are handrails on both sides of the stairs and use them. Fix handrails that are broken or loose. Make sure that handrails are as long as the stairways. Check any carpeting to make sure that it is firmly attached to the stairs. Fix any carpet that is loose or worn. Avoid having throw rugs at the top or bottom of the stairs. If you do have throw rugs, attach them to the floor with carpet tape. Make sure that you have a light switch at the top of the stairs and the bottom of the stairs. If you do not have them,  ask someone to add them for you. What else can I do to help prevent falls? Wear shoes that: Do not have high heels. Have rubber bottoms. Are comfortable and fit you well. Are closed at the toe. Do not wear sandals. If you use a stepladder: Make sure that it is fully opened. Do not climb a closed stepladder. Make sure that both sides of the stepladder are locked into place. Ask someone to hold it for you, if possible. Clearly mark and make sure that you can see: Any grab bars or handrails. First and last steps. Where the edge of each step is. Use tools that help you move around (mobility aids) if they are needed. These include: Canes. Walkers. Scooters. Crutches. Turn on the lights when you go into a dark area. Replace any light bulbs as soon as they burn out. Set up your furniture so you have a clear path. Avoid moving your furniture around. If any of your floors are uneven, fix them. If there are any pets around you, be aware of where they are. Review your medicines with your doctor. Some medicines can make you feel dizzy. This can increase your chance of falling. Ask your doctor what other things that you can do to help prevent falls. This information is not intended to replace advice given to you by your health care provider. Make sure you discuss any questions you have with your health care provider. Document Released: 01/21/2009 Document Revised: 09/02/2015 Document Reviewed: 05/01/2014 Elsevier Interactive Patient Education  2017 [employer].

## 2022-07-13 NOTE — Progress Notes (Signed)
I connected with  Monique Franklin on 07/13/22 by a audio enabled telemedicine application and verified that I am speaking with the correct person using two identifiers.  Patient Location: Home  Provider Location: Office/Clinic  I discussed the limitations of evaluation and management by telemedicine. The patient expressed understanding and agreed to proceed.  Subjective:   Monique Franklin is a 67 y.o. female who presents for Medicare Annual (Subsequent) preventive examination.  Review of Systems     Cardiac Risk Factors include: advanced age (>58men, >39 women);hypertension;diabetes mellitus     Objective:    There were no vitals filed for this visit. There is no height or weight on file to calculate BMI.     07/13/2022    3:01 PM 01/19/2022    7:37 AM 11/30/2020    7:37 AM 11/09/2020    9:02 AM 10/31/2019   11:14 AM  Advanced Directives  Does Patient Have a Medical Advance Directive? No Yes Yes Yes Yes  Type of Advance Directive  Living will;Healthcare Power of [place];Living will [place];Living will   Does patient want to make changes to medical advance directive?   No - Patient declined No - Patient declined   Copy of [place] in Chart?  No - copy requested No - copy requested No - copy requested   Would patient like information on creating a medical advance directive? No - Patient declined No - Patient declined       Current Medications (verified) Outpatient Encounter Medications as of 07/13/2022  Medication Sig   aspirin EC 81 MG tablet Take 81 mg by mouth daily.   carvedilol (COREG) 25 MG tablet TAKE 1 TABLET BY MOUTH TWICE  DAILY   clobetasol ointment (TEMOVATE) 0.05 % Apply to affected area every night for 4 weeks, then every other day for 4 weeks and then twice a week for 4 weeks or until resolution.   FARXIGA 10 MG TABS tablet TAKE 1 TABLET BY MOUTH DAILY   glipiZIDE (GLUCOTROL) 10 MG tablet Take 1  tablet (10 mg total) by mouth 2 (two) times daily before a meal.   icosapent Ethyl (VASCEPA) 1 g capsule TAKE 2 CAPSULES BY MOUTH TWICE  DAILY   metFORMIN (GLUCOPHAGE) 1000 MG tablet TAKE ONE-HALF TABLET BY MOUTH  TWICE DAILY WITH A MEAL   Multiple Vitamin (MULTIVITAMIN) tablet Take 1 tablet by mouth daily.   oxybutynin (DITROPAN-XL) 5 MG 24 hr tablet TAKE 1 TABLET(5 MG) BY MOUTH AT BEDTIME   rosuvastatin (CRESTOR) 40 MG tablet TAKE 1 TABLET BY MOUTH DAILY AT  6PM   sacubitril-valsartan (ENTRESTO) 49-51 MG Take 1 tablet by mouth 2 (two) times daily.   No facility-administered encounter medications on file as of 07/13/2022.    Allergies (verified) Ticagrelor   History: Past Medical History:  Diagnosis Date   Cardiomyopathy    CHF (congestive heart failure)    Class I   Coronary artery disease    Diabetes mellitus without complication    GERD (gastroesophageal reflux disease)    HFrEF (heart failure with reduced ejection fraction)    Hyperlipidemia    Hypertension    MI (myocardial infarction)    PAD (peripheral artery disease)    Past Surgical History:  Procedure Laterality Date   BREAST BIOPSY Bilateral 2016   neg   CARDIAC CATHETERIZATION     CARDIAC DEFIBRILLATOR PLACEMENT     CATARACT EXTRACTION Bilateral    CATARACT EXTRACTION W/PHACO Right 11/09/2020  Procedure: CATARACT EXTRACTION PHACO AND INTRAOCULAR LENS PLACEMENT ([place]) RIGHT VIVITY  DIABETIC;  Surgeon: Birder Robson, MD;  Location: [facility];  Service: Ophthalmology;  Laterality: Right;  2.85 00:33.2   CATARACT EXTRACTION W/PHACO Left 11/30/2020   Procedure: CATARACT EXTRACTION PHACO AND INTRAOCULAR LENS PLACEMENT ([facility]) LEFT DIABETIC VIVITY 3.86 00:28.3;  Surgeon: Birder Robson, MD;  Location: [facility];  Service: Ophthalmology;  Laterality: Left;   COLONOSCOPY WITH PROPOFOL N/A 10/31/2019   Procedure: COLONOSCOPY WITH PROPOFOL;  Surgeon: Virgel Manifold, MD;  Location: [facility]  ENDOSCOPY;  Service: Endoscopy;  Laterality: N/A;  priority 4   COLONOSCOPY WITH PROPOFOL N/A 01/19/2022   Procedure: COLONOSCOPY WITH PROPOFOL;  Surgeon: Jonathon Bellows, MD;  Location[facility] ENDOSCOPY;  Service: Gastroenterology;  Laterality: N/A;   COMBINED AUGMENTATION MAMMAPLASTY AND ABDOMINOPLASTY     CORONARY ARTERY BYPASS GRAFT  08/2017   X2 with LIMA to LAD; 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;Severe 3 vessel CAD-PCI to LAD and LC x(5stents) Coronary dissection of LM to LAD    CORONARY ARTERY BYPASS GRAFT     CORONARY STENT PLACEMENT     PERCUTANEOUS CORONARY STENT INTERVENTION (PCI-S)     REDUCTION MAMMAPLASTY Bilateral 2017   TONSILLECTOMY     Family History  Problem Relation Age of Onset   Heart attack Mother    COPD Father    Thyroid cancer Sister    Breast cancer Sister 12   Diabetes Sister    Diabetes Brother    Social History   Socioeconomic History   Marital status: Widowed    Spouse name: Not on file   Number of children: Not on file   Years of education: Not on file   Highest education level: Not on file  Occupational History   Not on file  Tobacco Use   Smoking status: Former    Packs/day: 1.00    Years: 33.00    Additional pack years: 0.00    Total pack years: 33.00    Types: Cigarettes    Quit date: 2005    Years since quitting: 19.2   Smokeless tobacco: Never  Vaping Use   Vaping Use: Never used  Substance and Sexual Activity   Alcohol use: Yes    Comment: occasional   Drug use: Never   Sexual activity: Not Currently    Birth control/protection: Post-menopausal  Other Topics Concern   Not on file  Social History Narrative   Not on file   Social Determinants of Health   Financial Resource Strain: Low Risk  (07/13/2022)   Overall Financial Resource Strain (CARDIA)    Difficulty of Paying Living Expenses: Not hard at all  Food Insecurity: No Food Insecurity (07/13/2022)   Hunger Vital Sign    Worried About Running Out of Food in the Last Year: Never  true    [place] in the Last Year: Never true  Transportation Needs: No Transportation Needs (07/13/2022)   PRAPARE - Hydrologist (Medical): No    Lack of Transportation (Non-Medical): No  Physical Activity: Insufficiently Active (07/13/2022)   Exercise Vital Sign    Days of Exercise per Week: 2 days    Minutes of Exercise per Session: 20 min  Stress: No Stress Concern Present (07/13/2022)   [place]    Feeling of Stress : Not at all  Social Connections: Socially Isolated (07/13/2022)   Social Connection and Isolation Panel [NHANES]    Frequency of  Communication with Friends and Family: More than three times a week    Frequency of Social Gatherings with Friends and Family: Once a week    Attends Religious Services: Never    Marine scientist or Organizations: No    Attends Archivist Meetings: Never    Marital Status: Widowed    Tobacco Counseling Counseling given: Not Answered   Clinical Intake:  Pre-visit preparation completed: Yes  Pain : No/denies pain     Nutritional Risks: None Diabetes: Yes CBG done?: No Did pt. bring in CBG monitor from home?: No  How often do you need to have someone help you when you read instructions, pamphlets, or other written materials from your doctor or pharmacy?: 1 - Never  Diabetic?yes Nutrition Risk Assessment:  Has the patient had any N/V/D within the last 2 months?  No  Does the patient have any non-healing wounds?  No  Has the patient had any unintentional weight loss or weight gain?  No   Diabetes:  Is the patient diabetic?  Yes  If diabetic, was a CBG obtained today?  No  Did the patient bring in their glucometer from home?  No  How often do you monitor your CBG's? never.   Financial Strains and Diabetes Management:  Are you having any financial strains with the device, your supplies or your medication? No .   Does the patient want to be seen by Chronic Care Management for management of their diabetes?  No  Would the patient like to be referred to a Nutritionist or for Diabetic Management?  No   Diabetic Exams:  Diabetic Eye Exam: Completed 07/29/21. - has appointment coming up. Pt has been advised about the importance in completing this exam.  Diabetic Foot Exam: Completed 12/26/21. Pt has been advised about the importance in completing this exam.   Interpreter Needed?: No  Information entered by :: Kirke Shaggy, LPN   Activities of Daily Living    07/13/2022    3:01 PM 07/09/2022    5:56 PM  In your present state of health, do you have any difficulty performing the following activities:  Hearing? 0 0  Vision? 0 0  Difficulty concentrating or making decisions? 0 0  Walking or climbing stairs? 0 0  Dressing or bathing? 0 0  Doing errands, shopping? 0 0  Preparing Food and eating ? N N  Using the Toilet? N N  In the past six months, have you accidently leaked urine? Y Y  Do you have problems with loss of bowel control? N N  Managing your Medications? N N  Managing your Finances? N N  Housekeeping or managing your Housekeeping? N N    Patient Care Team: Jearld Fenton, NP as PCP - General (Internal Medicine) Kate Sable, MD as PCP - Cardiology (Cardiology)  Indicate any recent Medical Services you may have received from other than [facility] providers in the past year (date may be approximate).     Assessment:   This is a routine wellness examination for Monique Franklin.  Hearing/Vision screen Hearing Screening - Comments:: No aids Vision Screening - Comments:: Wears glasses- [facility]  Dietary issues and exercise activities discussed: Current Exercise Habits: Home exercise routine, Type of exercise: walking, Time (Minutes): 20, Frequency (Times/Week): 2, Weekly Exercise (Minutes/Week): 40, Intensity: Mild   Goals Addressed             This Visit's Progress    DIET - EAT  MORE FRUITS AND VEGETABLES  Depression Screen    07/13/2022    3:00 PM 05/31/2022   10:29 AM 03/27/2022    9:09 AM 12/26/2021   10:09 AM 10/13/2020    9:20 AM 05/12/2020    3:16 PM 01/03/2019    4:23 PM  PHQ 2/9 Scores  PHQ - 2 Score 0 0 0 0 0 0 0  PHQ- 9 Score 0   0 0      Fall Risk    07/13/2022    3:01 PM 07/09/2022    5:56 PM 05/31/2022   10:29 AM 03/27/2022    9:09 AM 12/26/2021   10:14 AM  Fall Risk   Falls in the past year? 0 0 0 0 0  Number falls in past yr: 0    0  Injury with Fall? 0  0 0 0  Risk for fall due to : No Fall Risks  No Fall Risks  No Fall Risks  Follow up Falls prevention discussed;Falls evaluation completed    Falls evaluation completed    FALL RISK PREVENTION PERTAINING TO THE HOME:  Any stairs in or around the home? Yes  If so, are there any without handrails? No  Home free of loose throw rugs in walkways, pet beds, electrical cords, etc? Yes  Adequate lighting in your home to reduce risk of falls? Yes   ASSISTIVE DEVICES UTILIZED TO PREVENT FALLS:  Life alert? No  Use of a cane, walker or w/c? No  Grab bars in the bathroom? Yes  Shower chair or bench in shower? Yes  Elevated toilet seat or a handicapped toilet? No    Cognitive Function:        07/13/2022    3:06 PM  6CIT Screen  What Year? 0 points  What month? 0 points  What time? 0 points  Count back from 20 0 points  Months in reverse 0 points  Repeat phrase 0 points  Total Score 0 points    Immunizations Immunization History  Administered Date(s) Administered   Fluad Quad(high Dose 65+) 01/18/2021, 12/26/2021   Influenza,inj,Quad PF,6+ Mos 01/03/2019, 05/12/2020   Influenza,inj,quad, With Preservative 01/08/2018   PFIZER(Purple Top)SARS-COV-2 Vaccination 05/30/2019, 06/20/2019, 01/09/2020   PNEUMOCOCCAL CONJUGATE-20 10/13/2020   Zoster Recombinat (Shingrix) 06/12/2018, 08/14/2018    TDAP status: Due, Education has been provided regarding the importance of this vaccine.  Advised may receive this vaccine at local pharmacy or Health Dept. Aware to provide a copy of the vaccination record if obtained from local pharmacy or Health Dept. Verbalized acceptance and understanding.  Flu Vaccine status: Up to date  Pneumococcal vaccine status: Up to date  Covid-19 vaccine status: Completed vaccines  Qualifies for Shingles Vaccine? Yes   Zostavax completed No   Shingrix Completed?: Yes  Screening Tests Health Maintenance  Topic Date Due   Hepatitis C Screening  Never done   DTaP/Tdap/Td (1 - Tdap) Never done   DEXA SCAN  Never done   HEMOGLOBIN A1C  06/26/2022   COVID-19 Vaccine (4 - 2023-24 season) 12/10/2022 (Originally 12/09/2021)   OPHTHALMOLOGY EXAM  07/30/2022   INFLUENZA VACCINE  11/09/2022   FOOT EXAM  12/27/2022   Diabetic kidney evaluation - eGFR measurement  02/11/2023   Diabetic kidney evaluation - Urine ACR  03/28/2023   MAMMOGRAM  06/01/2023   Medicare Annual Wellness (AWV)  07/13/2023   COLONOSCOPY (Pts 45-93yrs Insurance coverage will need to be confirmed)  01/20/2027   Pneumonia Vaccine 59+ Years old  Completed   Zoster Vaccines- Shingrix  Completed  HPV VACCINES  Aged Out    Health Maintenance  Health Maintenance Due  Topic Date Due   Hepatitis C Screening  Never done   DTaP/Tdap/Td (1 - Tdap) Never done   DEXA SCAN  Never done   HEMOGLOBIN A1C  06/26/2022    Colorectal cancer screening: Type of screening: Colonoscopy. Completed 01/19/22. Repeat every 5 years  Mammogram status: Ordered 03/27/22. Pt provided with contact info and advised to call to schedule appt.   Bone Density status: Ordered 03/27/22. Pt provided with contact info and advised to call to schedule appt.  Lung Cancer Screening: (Low Dose CT Chest recommended if Age 39-80 years, 30 pack-year currently smoking OR have quit w/in 15years.) does not qualify.   Additional Screening:  Hepatitis C Screening: does qualify; Completed no  Vision Screening: Recommended  annual ophthalmology exams for early detection of glaucoma and other disorders of the eye. Is the patient up to date with their annual eye exam?  Yes  Who is the provider or what is the name of the office in which the patient attends annual eye exams? [facility] If pt is not established with a provider, would they like to be referred to a provider to establish care? No .   Dental Screening: Recommended annual dental exams for proper oral hygiene  Community Resource Referral / Chronic Care Management: CRR required this visit?  No   CCM required this visit?  No      Plan:     I have personally reviewed and noted the following in the patient's chart:   Medical and social history Use of alcohol, tobacco or illicit drugs  Current medications and supplements including opioid prescriptions. Patient is not currently taking opioid prescriptions. Functional ability and status Nutritional status Physical activity Advanced directives List of other physicians Hospitalizations, surgeries, and ER visits in previous 12 months Vitals Screenings to include cognitive, depression, and falls Referrals and appointments  In addition, I have reviewed and discussed with patient certain preventive protocols, quality metrics, and best practice recommendations. A written personalized care plan for preventive services as well as general preventive health recommendations were provided to patient.     Dionisio David, LPN   [id number]   Nurse Notes: none

## 2022-07-18 ENCOUNTER — Encounter: Payer: Self-pay | Admitting: Internal Medicine

## 2022-07-18 ENCOUNTER — Ambulatory Visit (INDEPENDENT_AMBULATORY_CARE_PROVIDER_SITE_OTHER): Payer: Medicare Other | Admitting: Internal Medicine

## 2022-07-18 VITALS — BP 116/66 | HR 76 | Temp 96.8°F | Wt 179.0 lb

## 2022-07-18 DIAGNOSIS — I739 Peripheral vascular disease, unspecified: Secondary | ICD-10-CM

## 2022-07-18 DIAGNOSIS — I10 Essential (primary) hypertension: Secondary | ICD-10-CM | POA: Diagnosis not present

## 2022-07-18 DIAGNOSIS — I219 Acute myocardial infarction, unspecified: Secondary | ICD-10-CM

## 2022-07-18 DIAGNOSIS — I2510 Atherosclerotic heart disease of native coronary artery without angina pectoris: Secondary | ICD-10-CM | POA: Diagnosis not present

## 2022-07-18 DIAGNOSIS — R350 Frequency of micturition: Secondary | ICD-10-CM | POA: Diagnosis not present

## 2022-07-18 DIAGNOSIS — E785 Hyperlipidemia, unspecified: Secondary | ICD-10-CM

## 2022-07-18 DIAGNOSIS — N1832 Chronic kidney disease, stage 3b: Secondary | ICD-10-CM

## 2022-07-18 DIAGNOSIS — E1169 Type 2 diabetes mellitus with other specified complication: Secondary | ICD-10-CM | POA: Diagnosis not present

## 2022-07-18 DIAGNOSIS — K5900 Constipation, unspecified: Secondary | ICD-10-CM

## 2022-07-18 DIAGNOSIS — I509 Heart failure, unspecified: Secondary | ICD-10-CM

## 2022-07-18 DIAGNOSIS — I428 Other cardiomyopathies: Secondary | ICD-10-CM

## 2022-07-18 DIAGNOSIS — Z6831 Body mass index (BMI) 31.0-31.9, adult: Secondary | ICD-10-CM

## 2022-07-18 DIAGNOSIS — K219 Gastro-esophageal reflux disease without esophagitis: Secondary | ICD-10-CM

## 2022-07-18 DIAGNOSIS — E6609 Other obesity due to excess calories: Secondary | ICD-10-CM

## 2022-07-18 DIAGNOSIS — N3281 Overactive bladder: Secondary | ICD-10-CM

## 2022-07-18 LAB — POCT GLYCOSYLATED HEMOGLOBIN (HGB A1C): Hemoglobin A1C: 10 % — AB (ref 4.0–5.6)

## 2022-07-18 MED ORDER — GLIPIZIDE 10 MG PO TABS: 10.0000 mg | ORAL_TABLET | Freq: Two times a day (BID) | ORAL | 1 refills | Status: DC

## 2022-07-18 MED ORDER — OXYBUTYNIN CHLORIDE ER 5 MG PO TB24: ORAL_TABLET | ORAL | 1 refills | Status: DC

## 2022-07-18 MED ORDER — DEXCOM G7 SENSOR MISC: 1.0000 | 0 refills | Status: DC

## 2022-07-18 MED ORDER — TOUJEO MAX SOLOSTAR 300 UNIT/ML ~~LOC~~ SOPN: 10.0000 [IU] | PEN_INJECTOR | Freq: Every day | SUBCUTANEOUS | 0 refills | Status: DC

## 2022-07-18 MED ORDER — DEXCOM G7 RECEIVER DEVI: 1.0000 | Freq: Three times a day (TID) | 0 refills | Status: DC

## 2022-07-18 NOTE — Assessment & Plan Note (Signed)
Controlled on carvedilol and Entresto Reinforced DASH diet and exercise for weight loss C-Met today

## 2022-07-18 NOTE — Progress Notes (Signed)
Subjective:    Patient ID: Monique Franklin, female    DOB: 05-Dec-1955, 67 y.o.   MRN: [id number]  HPI  Patient presents to clinic today for 36-month follow-up of chronic conditions.  HLD with CAD status post MI and with PAD: Her last LDL was 47, triglycerides 677, 03/2022.  She denies myalgias on Rosuvastatin and Vascepa.  She is taking Aspirin as well.  She tries to consume a low-fat diet.  DM 2: Her last A1c was 9.4%, 12/2018.Marland Kitchen  She is taking Metformin, Glipizide and Farxiga as prescribed.  She does not check her sugars.  She checks her feet routinely.  Her last eye exam was 07/2021.  Flu 12/2021.  Prevnar 20 10/2020.  COVID Pfizer x 3.  CKD 3: Her last creatinine was 1.39, GFR 42, 02/2022.  She is on Valsartan for renal protection.  She does not follow with nephrology.  HTN with Cardiomyopathy.  Her BP today is 116/66.  She is taking Carvedilol, Entresto as prescribed.  ECG from 01/2022 reviewed.  CHF: She denies chronic cough, shortness of breath or lower extremity edema.  She is taking Carvedilol and Entresto as prescribed.  She is not currently on any diuretics.  Echo from 01/2022 reviewed.  GERD: Triggered by tomato-based foods and laying down after eating.  She takes Tums as needed with good relief of symptoms.  Upper GI from 01/2022 reviewed.  OAB: She reports mainly urinary urgency.  She takes Oxybutynin as prescribed but has been out the last month.  She does not follow with urology.  Review of Systems  Past Medical History:  Diagnosis Date   Cardiomyopathy    CHF (congestive heart failure)    Class I   Coronary artery disease    Diabetes mellitus without complication    GERD (gastroesophageal reflux disease)    HFrEF (heart failure with reduced ejection fraction)    Hyperlipidemia    Hypertension    MI (myocardial infarction)    PAD (peripheral artery disease)     Current Outpatient Medications  Medication Sig Dispense Refill   aspirin EC 81 MG tablet Take 81 mg by  mouth daily.     carvedilol (COREG) 25 MG tablet TAKE 1 TABLET BY MOUTH TWICE  DAILY 180 tablet 3   clobetasol ointment (TEMOVATE) 0.05 % Apply to affected area every night for 4 weeks, then every other day for 4 weeks and then twice a week for 4 weeks or until resolution. 30 g 5   FARXIGA 10 MG TABS tablet TAKE 1 TABLET BY MOUTH DAILY 90 tablet 3   glipiZIDE (GLUCOTROL) 10 MG tablet Take 1 tablet (10 mg total) by mouth 2 (two) times daily before a meal. 180 tablet 0   icosapent Ethyl (VASCEPA) 1 g capsule TAKE 2 CAPSULES BY MOUTH TWICE  DAILY 360 capsule 3   metFORMIN (GLUCOPHAGE) 1000 MG tablet TAKE ONE-HALF TABLET BY MOUTH  TWICE DAILY WITH A MEAL 90 tablet 0   Multiple Vitamin (MULTIVITAMIN) tablet Take 1 tablet by mouth daily.     oxybutynin (DITROPAN-XL) 5 MG 24 hr tablet TAKE 1 TABLET(5 MG) BY MOUTH AT BEDTIME 90 tablet 3   rosuvastatin (CRESTOR) 40 MG tablet TAKE 1 TABLET BY MOUTH DAILY AT  6PM 90 tablet 3   sacubitril-valsartan (ENTRESTO) 49-51 MG Take 1 tablet by mouth 2 (two) times daily. 180 tablet 1   No current facility-administered medications for this visit.    Allergies  Allergen Reactions   Ticagrelor Rash  Family History  Problem Relation Age of Onset   Heart attack Mother    COPD Father    Thyroid cancer Sister    Breast cancer Sister 97   Diabetes Sister    Diabetes Brother     Social History   Socioeconomic History   Marital status: Widowed    Spouse name: Not on file   Number of children: Not on file   Years of education: Not on file   Highest education level: Not on file  Occupational History   Not on file  Tobacco Use   Smoking status: Former    Packs/day: 1.00    Years: 33.00    Additional pack years: 0.00    Total pack years: 33.00    Types: Cigarettes    Quit date: 2005    Years since quitting: 19.2   Smokeless tobacco: Never  Vaping Use   Vaping Use: Never used  Substance and Sexual Activity   Alcohol use: Yes    Comment: occasional    Drug use: Never   Sexual activity: Not Currently    Birth control/protection: Post-menopausal  Other Topics Concern   Not on file  Social History Narrative   Not on file   Social Determinants of Health   Financial Resource Strain: Low Risk  (07/13/2022)   Overall Financial Resource Strain (CARDIA)    Difficulty of Paying Living Expenses: Not hard at all  Food Insecurity: No Food Insecurity (07/13/2022)   Hunger Vital Sign    Worried About Running Out of Food in the Last Year: Never true    Ran Out of Food in the Last Year: Never true  Transportation Needs: No Transportation Needs (07/13/2022)   PRAPARE - Administrator, Civil Service (Medical): No    Lack of Transportation (Non-Medical): No  Physical Activity: Insufficiently Active (07/13/2022)   Exercise Vital Sign    Days of Exercise per Week: 2 days    Minutes of Exercise per Session: 20 min  Stress: No Stress Concern Present (07/13/2022)   [employer] of Occupational Health - Occupational Stress Questionnaire    Feeling of Stress : Not at all  Social Connections: Socially Isolated (07/13/2022)   Social Connection and Isolation Panel [NHANES]    Frequency of Communication with Friends and Family: More than three times a week    Frequency of Social Gatherings with Friends and Family: Once a week    Attends Religious Services: Never    Database administrator or Organizations: No    Attends Banker Meetings: Never    Marital Status: Widowed  Intimate Partner Violence: Not At Risk (07/13/2022)   Humiliation, Afraid, Rape, and Kick questionnaire    Fear of Current or Ex-Partner: No    Emotionally Abused: No    Physically Abused: No    Sexually Abused: No     Constitutional: Denies fever, malaise, fatigue, headache or abrupt weight changes.  HEENT: Denies eye pain, eye redness, ear pain, ringing in the ears, wax buildup, runny nose, nasal congestion, bloody nose, or sore throat. Respiratory: Denies  difficulty breathing, shortness of breath, cough or sputum production.   Cardiovascular: Denies chest pain, chest tightness, palpitations or swelling in the hands or feet.  Gastrointestinal: Pt reports abdominal pain and constipation. Denies bloating, diarrhea or blood in the stool.  GU: Patient reports urinary urgency.  Denies frequency, pain with urination, burning sensation, blood in urine, odor or discharge. Musculoskeletal: Denies decrease in range of motion, difficulty  with gait, muscle pain or joint pain and swelling.  Skin: Denies redness, rashes, lesions or ulcercations.  Neurological: Denies dizziness, difficulty with memory, difficulty with speech or problems with balance and coordination.  Psych: Denies anxiety, depression, SI/HI.  No other specific complaints in a complete review of systems (except as listed in HPI above).     Objective:   Physical Exam  BP 116/66 (BP Location: Left Arm, Patient Position: Sitting, Cuff Size: Normal)   Pulse 76   Temp (!) 96.8 F (36 C) (Temporal)   Wt 179 lb (81.2 kg)   SpO2 99%   BMI 31.71 kg/m   Wt Readings from Last 3 Encounters:  07/13/22 180 lb (81.6 kg)  05/31/22 180 lb (81.6 kg)  05/02/22 178 lb (80.7 kg)    General: Appears her stated age, obese, in NAD. Skin: Warm, dry and intact. No ulcerations noted. HEENT: Head: normal shape and size; Eyes: sclera white, no icterus, conjunctiva pink, PERRLA and EOMs intact;  Cardiovascular: Normal rate and rhythm. S1,S2 noted.  No murmur, rubs or gallops noted. No JVD or BLE edema. No carotid bruits noted. Pulmonary/Chest: Normal effort and positive vesicular breath sounds. No respiratory distress. No wheezes, rales or ronchi noted.  Abdomen: Hypoactive bowel sounds.  Musculoskeletal:  No difficulty with gait.  Neurological: Alert and oriented.  Coordination normal.  Psychiatric: Mood and affect normal. Behavior is normal. Judgment and thought content normal.     BMET    Component  Value Date/Time   NA 139 02/10/2022 0928   NA 139 06/12/2019 0840   K 4.2 02/10/2022 0928   CL 109 02/10/2022 0928   CO2 22 02/10/2022 0928   GLUCOSE 253 (H) 02/10/2022 0928   BUN 25 (H) 02/10/2022 0928   BUN 28 (H) 06/12/2019 0840   CREATININE 1.39 (H) 02/10/2022 0928   CREATININE 1.09 (H) 04/25/2021 0920   CALCIUM 9.2 02/10/2022 0928   GFRNONAA 42 (L) 02/10/2022 0928   GFRAA >60 11/03/2019 1139    Lipid Panel     Component Value Date/Time   CHOL 128 03/27/2022 0905   TRIG 232 (H) 03/27/2022 0905   HDL 52 03/27/2022 0905   CHOLHDL 2.5 03/27/2022 0905   VLDL 53 (H) 08/31/2020 0922   LDLCALC 47 03/27/2022 0905    CBC    Component Value Date/Time   WBC 7.8 04/25/2021 0920   RBC 4.23 04/25/2021 0920   HGB 11.8 04/25/2021 0920   HCT 35.9 04/25/2021 0920   PLT 232 04/25/2021 0920   MCV 84.9 04/25/2021 0920   MCH 27.9 04/25/2021 0920   MCHC 32.9 04/25/2021 0920   RDW 14.6 04/25/2021 0920    Hgb A1C Lab Results  Component Value Date   HGBA1C 9.4 (A) 12/26/2021           Assessment & Plan:   Constipation:  Encouraged high-fiber diet and adequate water intake Okay to take MiraLAX OTC as needed  RTC in 3 months for follow-up of chronic conditions Nicki Reaperegina Zakariyah Freimark, NP

## 2022-07-18 NOTE — Assessment & Plan Note (Signed)
C-Met and lipid profile today Encouraged her to consume low-fat diet Continue rosuvastatin and Vascepa

## 2022-07-18 NOTE — Assessment & Plan Note (Signed)
Oxybutynin refilled today

## 2022-07-18 NOTE — Assessment & Plan Note (Signed)
Encourage diet and exercise for weight loss 

## 2022-07-18 NOTE — Assessment & Plan Note (Signed)
C-Met today Continue valsartan for renal protection

## 2022-07-18 NOTE — Assessment & Plan Note (Signed)
Encourage weight loss as this can help reduce reflux symptoms Avoid foods that trigger reflux Okay to continue Tums OTC as needed

## 2022-07-18 NOTE — Patient Instructions (Signed)

## 2022-07-18 NOTE — Assessment & Plan Note (Signed)
C-Met and lipid profile today Encouraged her to consume a low-fat diet Continue rosuvastatin, Vascepa, aspirin, carvedilol

## 2022-07-18 NOTE — Assessment & Plan Note (Signed)
Encourage low-fat diet C-Met and lipid profile today Continue rosuvastatin, Vascepa, aspirin, carvedilol and Entresto

## 2022-07-18 NOTE — Assessment & Plan Note (Signed)
No ulceration Continue rosuvastatin and aspirin

## 2022-07-18 NOTE — Assessment & Plan Note (Signed)
Compensated Continue carvedilol and Entresto Reinforced DASH diet Monitor daily weights

## 2022-07-18 NOTE — Assessment & Plan Note (Signed)
Continue carvedilol and Entresto Reinforced DASH diet and exercise for weight loss C-Met today

## 2022-07-18 NOTE — Assessment & Plan Note (Signed)
POCT A1c 10% Urine microalbumin has been checked within the last year Encourage low-carb diet and exercise for weight loss Continue metformin, glipizide and Farxiga Will add Toujeo 10 units daily Advised her to monitor fasting sugars and update me in 2 weeks Encouraged routine exam Encouraged routine foot

## 2022-07-19 LAB — COMPLETE METABOLIC PANEL WITH GFR
AG Ratio: 1.3 (calc) (ref 1.0–2.5)
ALT: 12 U/L (ref 6–29)
AST: 11 U/L (ref 10–35)
Albumin: 4 g/dL (ref 3.6–5.1)
Alkaline phosphatase (APISO): 53 U/L (ref 37–153)
BUN/Creatinine Ratio: 15 (calc) (ref 6–22)
BUN: 19 mg/dL (ref 7–25)
CO2: 26 mmol/L (ref 20–32)
Calcium: 9.9 mg/dL (ref 8.6–10.4)
Chloride: 103 mmol/L (ref 98–110)
Creat: 1.25 mg/dL — ABNORMAL HIGH (ref 0.50–1.05)
Globulin: 3.2 g/dL (calc) (ref 1.9–3.7)
Glucose, Bld: 279 mg/dL — ABNORMAL HIGH (ref 65–99)
Potassium: 4.4 mmol/L (ref 3.5–5.3)
Sodium: 138 mmol/L (ref 135–146)
Total Bilirubin: 0.4 mg/dL (ref 0.2–1.2)
Total Protein: 7.2 g/dL (ref 6.1–8.1)
eGFR: 47 mL/min/{1.73_m2} — ABNORMAL LOW (ref >=60)

## 2022-07-19 LAB — LIPID PANEL
Cholesterol: 152 mg/dL (ref <200)
HDL: 63 mg/dL (ref >=50)
LDL Cholesterol (Calc): 60 mg/dL (calc)
Non-HDL Cholesterol (Calc): 89 mg/dL (calc) (ref <130)
Total CHOL/HDL Ratio: 2.4 (calc) (ref <5.0)
Triglycerides: 218 mg/dL — ABNORMAL HIGH (ref <150)

## 2022-07-24 ENCOUNTER — Ambulatory Visit (INDEPENDENT_AMBULATORY_CARE_PROVIDER_SITE_OTHER): Payer: Medicare Other

## 2022-07-24 DIAGNOSIS — I255 Ischemic cardiomyopathy: Secondary | ICD-10-CM

## 2022-07-24 DIAGNOSIS — I5022 Chronic systolic (congestive) heart failure: Secondary | ICD-10-CM

## 2022-07-24 LAB — CUP PACEART REMOTE DEVICE CHECK
Battery Remaining Percentage: 63 %
Battery Voltage: 2.96 V
Brady Statistic RV Percent Paced: 1 %
Date Time Interrogation Session: 20240415020022
HighPow Impedance: 77 Ohm
HighPow Impedance: 77 Ohm
Implantable Lead Location: 753860
Implantable Pulse Generator Implant Date: 20200707
Lead Channel Impedance Value: 640 Ohm
Lead Channel Pacing Threshold Amplitude: 0.5 V
Lead Channel Sensing Intrinsic Amplitude: 11.7 mV
Lead Channel Setting Pacing Amplitude: 2.5 V
Lead Channel Setting Pacing Pulse Width: 0.5 ms
Lead Channel Setting Sensing Sensitivity: 0.5 mV
Pulse Gen Serial Number: 9816687

## 2022-07-25 ENCOUNTER — Other Ambulatory Visit (HOSPITAL_COMMUNITY): Payer: Self-pay | Admitting: Cardiology

## 2022-07-25 ENCOUNTER — Other Ambulatory Visit: Payer: Self-pay | Admitting: Internal Medicine

## 2022-07-25 LAB — CUP PACEART REMOTE DEVICE CHECK
Battery Remaining Longevity: 66 mo
Implantable Lead Connection Status: 753985
Implantable Lead Implant Date: 20200707
Lead Channel Pacing Threshold Pulse Width: 0.5 ms

## 2022-07-26 NOTE — Telephone Encounter (Signed)
Requested Prescriptions  Pending Prescriptions Disp Refills   metFORMIN (GLUCOPHAGE) 1000 MG tablet [Pharmacy Med Name: metFORMIN HCl 1000 MG Oral Tablet] 90 tablet 0    Sig: TAKE ONE-HALF TABLET BY MOUTH  TWICE DAILY WITH A MEAL     Endocrinology:  Diabetes - Biguanides Failed - 07/25/2022 10:22 PM      Failed - Cr in normal range and within 360 days    Creat  Date Value Ref Range Status  07/18/2022 1.25 (H) 0.50 - 1.05 mg/dL Final   Creatinine, Urine  Date Value Ref Range Status  03/27/2022 72 20 - 275 mg/dL Final         Failed - HBA1C is between 0 and 7.9 and within 180 days    Hemoglobin A1C  Date Value Ref Range Status  07/18/2022 10.0 (A) 4.0 - 5.6 % Final   Hgb A1c MFr Bld  Date Value Ref Range Status  04/25/2021 8.6 (H) <5.7 % of total Hgb Final    Comment:    For someone without known diabetes, a hemoglobin A1c value of 6.5% or greater indicates that they may have  diabetes and this should be confirmed with a follow-up  test. . For someone with known diabetes, a value <7% indicates  that their diabetes is well controlled and a value  greater than or equal to 7% indicates suboptimal  control. A1c targets should be individualized based on  duration of diabetes, age, comorbid conditions, and  other considerations. . Currently, no consensus exists regarding use of hemoglobin A1c for diagnosis of diabetes for children. .          Failed - eGFR in normal range and within 360 days    GFR calc Af Amer  Date Value Ref Range Status  11/03/2019 >60 >60 mL/min Final   GFR, Estimated  Date Value Ref Range Status  02/10/2022 42 (L) >60 mL/min Final    Comment:    (NOTE) Calculated using the CKD-EPI Creatinine Equation (2021)    GFR  Date Value Ref Range Status  05/12/2020 53.53 (L) >60.00 mL/min Final    Comment:    Calculated using the CKD-EPI Creatinine Equation (2021)   eGFR  Date Value Ref Range Status  07/18/2022 47 (L) > OR = 60 mL/min/1.66m2 Final          Failed - B12 Level in normal range and within 720 days    No results found for: "VITAMINB12"       Failed - CBC within normal limits and completed in the last 12 months    WBC  Date Value Ref Range Status  04/25/2021 7.8 3.8 - 10.8 Thousand/uL Final   RBC  Date Value Ref Range Status  04/25/2021 4.23 3.80 - 5.10 Million/uL Final   Hemoglobin  Date Value Ref Range Status  04/25/2021 11.8 11.7 - 15.5 g/dL Final   HCT  Date Value Ref Range Status  04/25/2021 35.9 35.0 - 45.0 % Final   MCHC  Date Value Ref Range Status  04/25/2021 32.9 32.0 - 36.0 g/dL Final   [facility]  Date Value Ref Range Status  04/25/2021 27.9 27.0 - 33.0 pg Final   MCV  Date Value Ref Range Status  04/25/2021 84.9 80.0 - 100.0 fL Final   No results found for: "PLTCOUNTKUC", "LABPLAT", "POCPLA" RDW  Date Value Ref Range Status  04/25/2021 14.6 11.0 - 15.0 % Final         Passed - Valid encounter within last 6 months  Recent Outpatient Visits           1 week ago Type 2 diabetes mellitus with other specified complication, without long-term current use of insulin   [place] [facility] [place], [place] W, NP   1 month ago Rash and nonspecific skin eruption   [place] [facility] [place], Salvadore Oxford, NP   4 months ago Estrogen deficiency   [place] [facility] [place], [place] W, NP   7 months ago Type 2 diabetes mellitus with other specified complication, without long-term current use of insulin [facility])   [place] [facility] [place], [place] W, NP   1 year ago Type 2 diabetes mellitus with other specified complication, without long-term current use of insulin [facility])   [facility] [place], Salvadore Oxford, NP       Future Appointments             In 1 month Agbor-Etang, Arlys John, MD [facility] [place]   In 2 months Baity, Salvadore Oxford, NP [facility]

## 2022-08-02 LAB — HM DIABETES EYE EXAM

## 2022-08-03 ENCOUNTER — Ambulatory Visit
Admission: RE | Admit: 2022-08-03 | Discharge: 2022-08-03 | Disposition: A | Discharge status: Home / Self Care | Payer: Medicare Other | Source: Ambulatory Visit | Attending: Internal Medicine | Admitting: Internal Medicine

## 2022-08-03 DIAGNOSIS — E2839 Other primary ovarian failure: Secondary | ICD-10-CM | POA: Diagnosis not present

## 2022-08-03 DIAGNOSIS — Z1231 Encounter for screening mammogram for malignant neoplasm of breast: Secondary | ICD-10-CM | POA: Diagnosis present

## 2022-08-07 ENCOUNTER — Encounter: Payer: Self-pay | Admitting: Internal Medicine

## 2022-08-15 ENCOUNTER — Other Ambulatory Visit: Payer: Self-pay | Admitting: Internal Medicine

## 2022-08-15 MED ORDER — INSULIN PEN NEEDLE 31G X 5 MM MISC: 3 refills | Status: DC

## 2022-08-31 NOTE — Progress Notes (Signed)
Remote ICD transmission.   

## 2022-09-08 ENCOUNTER — Ambulatory Visit: Payer: Medicare Other | Attending: Cardiology | Admitting: Cardiology

## 2022-09-08 ENCOUNTER — Encounter: Payer: Self-pay | Admitting: Cardiology

## 2022-09-08 VITALS — BP 110/70 | HR 65 | Ht 63.0 in | Wt 180.6 lb

## 2022-09-08 DIAGNOSIS — Z95810 Presence of automatic (implantable) cardiac defibrillator: Secondary | ICD-10-CM | POA: Diagnosis present

## 2022-09-08 DIAGNOSIS — I2510 Atherosclerotic heart disease of native coronary artery without angina pectoris: Secondary | ICD-10-CM | POA: Diagnosis present

## 2022-09-08 DIAGNOSIS — I255 Ischemic cardiomyopathy: Secondary | ICD-10-CM | POA: Insufficient documentation

## 2022-09-08 NOTE — Patient Instructions (Signed)
Medication Instructions:  - Your physician recommends that you continue on your current medications as directed. Please refer to the Current Medication list given to you today.  *If you need a refill on your cardiac medications before your next appointment, please call your pharmacy*   Lab Work: - none ordered  If you have labs (blood work) drawn today and your tests are completely normal, you will receive your results only by: MyChart Message (if you have MyChart) OR A paper copy in the mail If you have any lab test that is abnormal or we need to change your treatment, we will call you to review the results.   Testing/Procedures: - none ordered   Follow-Up: At [facility], you and your health needs are our priority.  As part of our continuing mission to provide you with exceptional heart care, we have created designated Provider Care Teams.  These Care Teams include your primary Cardiologist (physician) and Advanced Practice Providers (APPs -  Physician Assistants and Nurse Practitioners) who all work together to provide you with the care you need, when you need it.  We recommend signing up for the patient portal called "MyChart".  Sign up information is provided on this After Visit Summary.  MyChart is used to connect with patients for Virtual Visits (Telemedicine).  Patients are able to view lab/test results, encounter notes, upcoming appointments, etc.  Non-urgent messages can be sent to your provider as well.   To learn more about what you can do with MyChart, go to [email or web address].    Your next appointment:   6 month(s)  Provider:   You may see Debbe Odea, MD or one of the following Advanced Practice Providers on your designated Care Team:   Nicolasa Ducking, NP Eula Listen, PA-C Cadence Fransico Michael, PA-C Charlsie Quest, NP    Other Instructions N/a

## 2022-09-08 NOTE — Progress Notes (Signed)
Cardiology Office Note:    Date:  09/08/2022   ID:  Monique Franklin, DOB [birth year], MRN [id number]  PCP:  Lorre Munroe, NP  Cardiologist:  Debbe Odea, MD  Electrophysiologist:  None   Referring MD: Lorre Munroe, NP   Chief Complaint  Patient presents with   Follow-up    Patient denies new or acute cardiac problems/concerns today.      History of Present Illness:    Monique Franklin is a 67 y.o. female with a hx of hypertension, hyperlipidemia, former smoker x20+ years, CAD, PCI to LAD and LCx(5 stents total), status post CABG x2 (LIMA to LAD, SVG to OM 2019), HFrEF EF35-40%, s/p ICD 2020(St. Jude's device), PAD(left femoral thrombo-endarterectomy with patch angioplasty 12/2017) who presents for follow-up.  Recently started on insulin due to poorly controlled diabetes.  She endorses not eating well, also not exercising contributing to worsening blood sugars.  Denies chest pain or shortness of breath.  Blood pressure is well-controlled, Entresto previously reduced due to low blood pressures.  Aldactone stopped due to worsening renal function.  Feels well, has no concerns at this time.   Prior notes Echo 03/2022 EF 30 to 35% Echo 02/2021 EF 35 to 40% Echo 01/2019 EF 30 to 35% Patient was originally seen to establish care.  She used to be followed at [facility] and later moved into the area.   After her CABG in 2019, she had a left femoral artery complication requiring left femoral patch angioplasty.  Earlier in 2020, she had an ICD placed due to low EF with ejection fraction of 31%.      Past Medical History:  Diagnosis Date   Cardiomyopathy (HCC)    CHF (congestive heart failure) (HCC)    Class I   Coronary artery disease    Diabetes mellitus without complication (HCC)    GERD (gastroesophageal reflux disease)    HFrEF (heart failure with reduced ejection fraction) (HCC)    Hyperlipidemia    Hypertension    MI (myocardial infarction) (HCC)    PAD (peripheral  artery disease) (HCC)     Past Surgical History:  Procedure Laterality Date   BREAST BIOPSY Bilateral 2016   neg   CARDIAC CATHETERIZATION     CARDIAC DEFIBRILLATOR PLACEMENT     CATARACT EXTRACTION Bilateral    CATARACT EXTRACTION W/PHACO Right 11/09/2020   Procedure: CATARACT EXTRACTION PHACO AND INTRAOCULAR LENS PLACEMENT (IOC) RIGHT VIVITY  DIABETIC;  Surgeon: Galen Manila, MD;  Location: [facility];  Service: Ophthalmology;  Laterality: Right;  2.85 00:33.2   CATARACT EXTRACTION W/PHACO Left 11/30/2020   Procedure: CATARACT EXTRACTION PHACO AND INTRAOCULAR LENS PLACEMENT (IOC) LEFT DIABETIC VIVITY 3.86 00:28.3;  Surgeon: Galen Manila, MD;  Location: [facility];  Service: Ophthalmology;  Laterality: Left;   COLONOSCOPY WITH PROPOFOL N/A 10/31/2019   Procedure: COLONOSCOPY WITH PROPOFOL;  Surgeon: Pasty Spillers, MD;  Location: [facility];  Service: Endoscopy;  Laterality: N/A;  priority 4   COLONOSCOPY WITH PROPOFOL N/A 01/19/2022   Procedure: COLONOSCOPY WITH PROPOFOL;  Surgeon: Wyline Mood, MD;  Location: [facility];  Service: Gastroenterology;  Laterality: N/A;   COMBINED AUGMENTATION MAMMAPLASTY AND ABDOMINOPLASTY     CORONARY ARTERY BYPASS GRAFT  08/2017   X2 with LIMA to LAD; 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;Severe 3 vessel CAD-PCI to LAD and LC x(5stents) Coronary dissection of LM to LAD    CORONARY ARTERY BYPASS GRAFT     CORONARY STENT PLACEMENT     PERCUTANEOUS CORONARY STENT INTERVENTION (  PCI-S)     REDUCTION MAMMAPLASTY Bilateral 2017   TONSILLECTOMY      Current Medications: Current Meds  Medication Sig   aspirin EC 81 MG tablet Take 81 mg by mouth daily.   carvedilol (COREG) 25 MG tablet TAKE 1 TABLET BY MOUTH TWICE  DAILY   clobetasol ointment (TEMOVATE) 0.05 % Apply to affected area every night for 4 weeks, then every other day for 4 weeks and then twice a week for 4 weeks or until resolution.   FARXIGA 10 MG TABS tablet TAKE  1 TABLET BY MOUTH DAILY   glipiZIDE (GLUCOTROL) 10 MG tablet Take 1 tablet (10 mg total) by mouth 2 (two) times daily before a meal.   icosapent Ethyl (VASCEPA) 1 g capsule TAKE 2 CAPSULES BY MOUTH TWICE  DAILY   insulin glargine, 2 Unit Dial, (TOUJEO MAX SOLOSTAR) 300 UNIT/ML Solostar Pen Inject 10 Units into the skin daily.   Insulin Pen Needle 31G X 5 MM MISC BD Pen Needles- brand specific Inject insulin via insulin pen 6 x daily   metFORMIN (GLUCOPHAGE) 1000 MG tablet TAKE ONE-HALF TABLET BY MOUTH  TWICE DAILY WITH A MEAL   Multiple Vitamin (MULTIVITAMIN) tablet Take 1 tablet by mouth daily.   oxybutynin (DITROPAN-XL) 5 MG 24 hr tablet TAKE 1 TABLET(5 MG) BY MOUTH AT BEDTIME   rosuvastatin (CRESTOR) 40 MG tablet TAKE 1 TABLET BY MOUTH DAILY AT  6PM   sacubitril-valsartan (ENTRESTO) 49-51 MG Take 1 tablet by mouth 2 (two) times daily.     Allergies:   Chlorhexidine, Tape, and Ticagrelor   Social History   Socioeconomic History   Marital status: Widowed    Spouse name: Not on file   Number of children: Not on file   Years of education: Not on file   Highest education level: Not on file  Occupational History   Not on file  Tobacco Use   Smoking status: Former    Packs/day: 1.00    Years: 33.00    Additional pack years: 0.00    Total pack years: 33.00    Types: Cigarettes    Quit date: 2005    Years since quitting: 19.4   Smokeless tobacco: Never  Vaping Use   Vaping Use: Never used  Substance and Sexual Activity   Alcohol use: Yes    Comment: occasional   Drug use: Never   Sexual activity: Not Currently    Birth control/protection: Post-menopausal  Other Topics Concern   Not on file  Social History Narrative   Not on file   Social Determinants of Health   Financial Resource Strain: Low Risk  (07/13/2022)   Overall Financial Resource Strain (CARDIA)    Difficulty of Paying Living Expenses: Not hard at all  Food Insecurity: No Food Insecurity (07/13/2022)   Hunger  Vital Sign    Worried About Running Out of Food in the Last Year: Never true    Ran Out of Food in the Last Year: Never true  Transportation Needs: No Transportation Needs (07/13/2022)   PRAPARE - Administrator, Civil Service (Medical): No    Lack of Transportation (Non-Medical): No  Physical Activity: Insufficiently Active (07/13/2022)   Exercise Vital Sign    Days of Exercise per Week: 2 days    Minutes of Exercise per Session: 20 min  Stress: No Stress Concern Present (07/13/2022)   [employer] of Occupational Health - Occupational Stress Questionnaire    Feeling of Stress : Not at all  Social Connections: Socially Isolated (07/13/2022)   Social Connection and Isolation Panel [NHANES]    Frequency of Communication with Friends and Family: More than three times a week    Frequency of Social Gatherings with Friends and Family: Once a week    Attends Religious Services: Never    Database administrator or Organizations: No    Attends Banker Meetings: Never    Marital Status: Widowed     Family History: The patient's family history includes Breast cancer (age of onset: 94) in her sister; COPD in her father; Diabetes in her brother and sister; Heart attack in her mother; Thyroid cancer in her sister.  ROS:   Please see the history of present illness.     All other systems reviewed and are negative.  EKGs/Labs/Other Studies Reviewed:    The following studies were reviewed today:  EKG:  EKG is ordered today.  EKG shows normal sinus rhythm,  Recent Labs: 07/18/2022: ALT 12; BUN 19; Creat 1.25; Potassium 4.4; Sodium 138  Recent Lipid Panel    Component Value Date/Time   CHOL 152 07/18/2022 0931   TRIG 218 (H) 07/18/2022 0931   HDL 63 07/18/2022 0931   CHOLHDL 2.4 07/18/2022 0931   VLDL 53 (H) 08/31/2020 0922   LDLCALC 60 07/18/2022 0931   LDLDIRECT 59.0 05/12/2020 1537    Physical Exam:    VS:  BP 110/70 (BP Location: Left Arm, Patient Position:  Sitting, Cuff Size: Normal)   Pulse 65   Ht 5\' 3"  (1.6 m)   Wt 180 lb 9.6 oz (81.9 kg)   SpO2 97%   BMI 31.99 kg/m     Wt Readings from Last 3 Encounters:  09/08/22 180 lb 9.6 oz (81.9 kg)  07/18/22 179 lb (81.2 kg)  07/13/22 180 lb (81.6 kg)     GEN:  Well nourished, well developed in no acute distress HEENT: Normal NECK: No JVD; No carotid bruits CARDIAC: RRR, no murmurs, rubs, gallops RESPIRATORY:  Clear to auscultation without rales, wheezing or rhonchi  ABDOMEN: Soft, non-tender, non-distended MUSCULOSKELETAL:  No edema; No deformity  SKIN: Warm and dry NEUROLOGIC:  Alert and oriented x 3 PSYCHIATRIC:  Normal affect   ASSESSMENT:    1. Ischemic cardiomyopathy   2. Coronary artery disease involving native coronary artery of native heart without angina pectoris   3. ICD (implantable cardioverter-defibrillator) in place    PLAN:    In order of problems listed above:  Ischemic cardiomyopathy EF 35, s/p ICD.  NYHA class II symptoms.  Denies shortness of breath, euvolemic on exam.  Continue Coreg 25 twice daily, Entresto 49-51 mg twice daily, continue Farxiga 10 mg daily. Aldactone previously stopped due to low blood pressures and worsening renal function.   CAD, PCI's, CABG x2.  Denies chest pain. continue aspirin 81 mg, Crestor 40 mg daily. S/p AICD.  Follows up with device clinic.  Follow-up in 6 months   Medication Adjustments/Labs and Tests Ordered: Current medicines are reviewed at length with the patient today.  Concerns regarding medicines are outlined above.  Orders Placed This Encounter  Procedures   EKG 12-Lead    No orders of the defined types were placed in this encounter.    Patient Instructions  Medication Instructions:  - Your physician recommends that you continue on your current medications as directed. Please refer to the Current Medication list given to you today.  *If you need a refill on your cardiac medications before your next  appointment,  please call your pharmacy*   Lab Work: - none ordered  If you have labs (blood work) drawn today and your tests are completely normal, you will receive your results only by: MyChart Message (if you have MyChart) OR A paper copy in the mail If you have any lab test that is abnormal or we need to change your treatment, we will call you to review the results.   Testing/Procedures: - none ordered   Follow-Up: At [facility], you and your health needs are our priority.  As part of our continuing mission to provide you with exceptional heart care, we have created designated Provider Care Teams.  These Care Teams include your primary Cardiologist (physician) and Advanced Practice Providers (APPs -  Physician Assistants and Nurse Practitioners) who all work together to provide you with the care you need, when you need it.  We recommend signing up for the patient portal called "MyChart".  Sign up information is provided on this After Visit Summary.  MyChart is used to connect with patients for Virtual Visits (Telemedicine).  Patients are able to view lab/test results, encounter notes, upcoming appointments, etc.  Non-urgent messages can be sent to your provider as well.   To learn more about what you can do with MyChart, go to [email or web address].    Your next appointment:   6 month(s)  Provider:   You may see Debbe Odea, MD or one of the following Advanced Practice Providers on your designated Care Team:   Nicolasa Ducking, NP Eula Listen, PA-C Cadence Fransico Michael, PA-C Charlsie Quest, NP    Other Instructions N/a    Signed, Debbe Odea, MD  09/08/2022 10:30 AM    [facility]

## 2022-09-19 ENCOUNTER — Ambulatory Visit: Payer: Medicare Other | Admitting: Cardiology

## 2022-10-18 ENCOUNTER — Encounter: Payer: Self-pay | Admitting: Internal Medicine

## 2022-10-18 ENCOUNTER — Ambulatory Visit (INDEPENDENT_AMBULATORY_CARE_PROVIDER_SITE_OTHER): Payer: Medicare Other | Admitting: Internal Medicine

## 2022-10-18 VITALS — BP 104/62 | HR 77 | Temp 96.5°F | Wt 178.0 lb

## 2022-10-18 DIAGNOSIS — I509 Heart failure, unspecified: Secondary | ICD-10-CM | POA: Diagnosis not present

## 2022-10-18 DIAGNOSIS — I739 Peripheral vascular disease, unspecified: Secondary | ICD-10-CM

## 2022-10-18 DIAGNOSIS — I2510 Atherosclerotic heart disease of native coronary artery without angina pectoris: Secondary | ICD-10-CM

## 2022-10-18 DIAGNOSIS — I10 Essential (primary) hypertension: Secondary | ICD-10-CM

## 2022-10-18 DIAGNOSIS — K219 Gastro-esophageal reflux disease without esophagitis: Secondary | ICD-10-CM

## 2022-10-18 DIAGNOSIS — I428 Other cardiomyopathies: Secondary | ICD-10-CM | POA: Diagnosis not present

## 2022-10-18 DIAGNOSIS — E785 Hyperlipidemia, unspecified: Secondary | ICD-10-CM

## 2022-10-18 DIAGNOSIS — N3281 Overactive bladder: Secondary | ICD-10-CM

## 2022-10-18 DIAGNOSIS — E6609 Other obesity due to excess calories: Secondary | ICD-10-CM

## 2022-10-18 DIAGNOSIS — Z6831 Body mass index (BMI) 31.0-31.9, adult: Secondary | ICD-10-CM

## 2022-10-18 DIAGNOSIS — I219 Acute myocardial infarction, unspecified: Secondary | ICD-10-CM

## 2022-10-18 DIAGNOSIS — N1832 Chronic kidney disease, stage 3b: Secondary | ICD-10-CM

## 2022-10-18 DIAGNOSIS — E1169 Type 2 diabetes mellitus with other specified complication: Secondary | ICD-10-CM

## 2022-10-18 LAB — POCT GLYCOSYLATED HEMOGLOBIN (HGB A1C): Hemoglobin A1C: 9.1 % — AB (ref 4.0–5.6)

## 2022-10-18 MED ORDER — TOUJEO MAX SOLOSTAR 300 UNIT/ML ~~LOC~~ SOPN: 14.0000 [IU] | PEN_INJECTOR | Freq: Every day | SUBCUTANEOUS | 0 refills | Status: DC

## 2022-10-18 NOTE — Assessment & Plan Note (Signed)
Controlled on carvedilol and Entresto Reinforced DASH diet

## 2022-10-18 NOTE — Assessment & Plan Note (Signed)
Compensated Continue carvedilol and Entresto Not on any diuretics Monitor daily weights

## 2022-10-18 NOTE — Assessment & Plan Note (Signed)
Controlled on carvedilol and Entresto Reinforced DASH diet and exercise for weight loss 

## 2022-10-18 NOTE — Assessment & Plan Note (Signed)
Continue oxybutynin Encouraged timed voiding and Kegel exercises

## 2022-10-18 NOTE — Patient Instructions (Signed)

## 2022-10-18 NOTE — Assessment & Plan Note (Signed)
No angina Continue carvedilol, rosuvastatin, Vascepa and aspirin Encourage low-fat diet

## 2022-10-18 NOTE — Assessment & Plan Note (Signed)
POCT A1c 9.1% Urine microalbumin has been checked within the last year Encourage low-carb diet and exercise for weight loss Increase Toujeo to 14 units daily, update me in 2 weeks with fasting sugars Continue metformin, glipizide and Farxiga Encourage routine eye exam Encouraged routine foot exam

## 2022-10-18 NOTE — Assessment & Plan Note (Signed)
Kidney function reviewed Continue valsartan for renal protection

## 2022-10-18 NOTE — Assessment & Plan Note (Signed)
No angina Continue carvedilol, rosuvastatin, Vascepa and aspirin Encourage low-fat diet 

## 2022-10-18 NOTE — Assessment & Plan Note (Signed)
Avoid foods that trigger reflux Encourage weight loss as this can help reduce reflux symptoms Continue Tums as needed 

## 2022-10-18 NOTE — Assessment & Plan Note (Signed)
Encourage diet exercise for weight loss

## 2022-10-18 NOTE — Assessment & Plan Note (Addendum)
Continue rosuvastatin and Vascepa Encouraged low-fat fat diet

## 2022-10-18 NOTE — Progress Notes (Signed)
Subjective:    Patient ID: Monique Franklin, female    DOB: 25-Nov-1955, 67 y.o.   MRN: [id number]  HPI  Patient presents to clinic today for 67-month follow-up of chronic conditions.  HLD with CAD status post MI with PAD: Her last LDL was 60, triglycerides 409,  07/2022.  She denies myalgias on rosuvastatin and vascepa.  She is taking aspirin as well.  She tries to consume a low-fat diet.  DM 2: Her last A1c 10%, 07/2022.  She is taking toujeo, metformin, glipizide and farxiga as prescribed.  She does not check her sugars.  She checks her feet routinely.  Her last eye exam was 07/2022.  Flu 12/2021.  Prevnar 20 10/2020.  COVID Pfizer x 3.  CKD 3: Her last creatinine was 1.25, GFR 47, 07/2022.  She is on valsartan for renal protection.  She does not follow with nephrology.  HTN with cardiomyopathy: Her BP today is 104/62.  She is taking carvedilol and entresto as prescribed.  ECG from 08/2022 reviewed.  CHF: She denies current cough, shortness of breath or lower extremity edema.  She is taking carvedilol and entresto as prescribed.  She is not currently on any diuretics.  Echo from 03/2022 reviewed.  GERD: Triggered by tomato based foods and laying down after eating.  She takes Tums as needed with good relief of symptoms.  Upper GI from 01/2022 reviewed.  OAB: She reports mainly urinary urgency.  She takes oxybutynin as prescribed.  She does not follow with urology.  Review of Systems  Past Medical History:  Diagnosis Date   Cardiomyopathy (HCC)    CHF (congestive heart failure) (HCC)    Class I   Coronary artery disease    Diabetes mellitus without complication (HCC)    GERD (gastroesophageal reflux disease)    HFrEF (heart failure with reduced ejection fraction) (HCC)    Hyperlipidemia    Hypertension    MI (myocardial infarction) (HCC)    PAD (peripheral artery disease) (HCC)     Current Outpatient Medications  Medication Sig Dispense Refill   aspirin EC 81 MG tablet Take 81 mg by  mouth daily.     carvedilol (COREG) 25 MG tablet TAKE 1 TABLET BY MOUTH TWICE  DAILY 180 tablet 0   clobetasol ointment (TEMOVATE) 0.05 % Apply to affected area every night for 4 weeks, then every other day for 4 weeks and then twice a week for 4 weeks or until resolution. 30 g 5   Continuous Blood Gluc Receiver (DEXCOM G7 RECEIVER) DEVI 1 Device by Does not apply route 3 (three) times daily. 1 each 0   FARXIGA 10 MG TABS tablet TAKE 1 TABLET BY MOUTH DAILY 90 tablet 3   glipiZIDE (GLUCOTROL) 10 MG tablet Take 1 tablet (10 mg total) by mouth 2 (two) times daily before a meal. 180 tablet 1   icosapent Ethyl (VASCEPA) 1 g capsule TAKE 2 CAPSULES BY MOUTH TWICE  DAILY 360 capsule 3   insulin glargine, 2 Unit Dial, (TOUJEO MAX SOLOSTAR) 300 UNIT/ML Solostar Pen Inject 10 Units into the skin daily. 15 mL 0   Insulin Pen Needle 31G X 5 MM MISC BD Pen Needles- brand specific Inject insulin via insulin pen 6 x daily 200 each 3   metFORMIN (GLUCOPHAGE) 1000 MG tablet TAKE ONE-HALF TABLET BY MOUTH  TWICE DAILY WITH A MEAL 90 tablet 1   Multiple Vitamin (MULTIVITAMIN) tablet Take 1 tablet by mouth daily.     oxybutynin (DITROPAN-XL) 5 MG  24 hr tablet TAKE 1 TABLET(5 MG) BY MOUTH AT BEDTIME 90 tablet 1   rosuvastatin (CRESTOR) 40 MG tablet TAKE 1 TABLET BY MOUTH DAILY AT  6PM 90 tablet 3   sacubitril-valsartan (ENTRESTO) 49-51 MG Take 1 tablet by mouth 2 (two) times daily. 180 tablet 1   No current facility-administered medications for this visit.    Allergies  Allergen Reactions   Chlorhexidine Itching    Only w/ wipes   Tape Rash   Ticagrelor Rash    Family History  Problem Relation Age of Onset   Heart attack Mother    COPD Father    Thyroid cancer Sister    Breast cancer Sister 99   Diabetes Sister    Diabetes Brother     Social History   Socioeconomic History   Marital status: Widowed    Spouse name: Not on file   Number of children: Not on file   Years of education: Not on file    Highest education level: Some college, no degree  Occupational History   Not on file  Tobacco Use   Smoking status: Former    Packs/day: 1.00    Years: 33.00    Additional pack years: 0.00    Total pack years: 33.00    Types: Cigarettes    Quit date: 2005    Years since quitting: 19.5   Smokeless tobacco: Never  Vaping Use   Vaping Use: Never used  Substance and Sexual Activity   Alcohol use: Yes    Comment: occasional   Drug use: Never   Sexual activity: Not Currently    Birth control/protection: Post-menopausal  Other Topics Concern   Not on file  Social History Narrative   Not on file   Social Determinants of Health   Financial Resource Strain: Low Risk  (10/14/2022)   Overall Financial Resource Strain (CARDIA)    Difficulty of Paying Living Expenses: Not hard at all  Food Insecurity: No Food Insecurity (10/14/2022)   Hunger Vital Sign    Worried About Running Out of Food in the Last Year: Never true    Ran Out of Food in the Last Year: Never true  Transportation Needs: No Transportation Needs (10/14/2022)   PRAPARE - Administrator, Civil Service (Medical): No    Lack of Transportation (Non-Medical): No  Physical Activity: Unknown (10/14/2022)   Exercise Vital Sign    Days of Exercise per Week: Patient declined    Minutes of Exercise per Session: 20 min  Stress: No Stress Concern Present (10/14/2022)   [employer] of Occupational Health - Occupational Stress Questionnaire    Feeling of Stress : Not at all  Social Connections: Socially Isolated (10/14/2022)   Social Connection and Isolation Panel [NHANES]    Frequency of Communication with Friends and Family: More than three times a week    Frequency of Social Gatherings with Friends and Family: Once a week    Attends Religious Services: Never    Database administrator or Organizations: No    Attends Banker Meetings: Never    Marital Status: Widowed  Intimate Partner Violence: Not At Risk  (07/13/2022)   Humiliation, Afraid, Rape, and Kick questionnaire    Fear of Current or Ex-Partner: No    Emotionally Abused: No    Physically Abused: No    Sexually Abused: No     Constitutional: Denies fever, malaise, fatigue, headache or abrupt weight changes.  HEENT: Denies eye pain, eye redness,  ear pain, ringing in the ears, wax buildup, runny nose, nasal congestion, bloody nose, or sore throat. Respiratory: Denies difficulty breathing, shortness of breath, cough or sputum production.   Cardiovascular: Denies chest pain, chest tightness, palpitations or swelling in the hands or feet.  Gastrointestinal: Denies abdominal pain, bloating, constipation, diarrhea or blood in the stool.  GU: Patient reports urinary urgency.  Denies frequency, pain with urination, burning sensation, blood in urine, odor or discharge. Musculoskeletal: Denies decrease in range of motion, difficulty with gait, muscle pain or joint pain and swelling.  Skin: Denies redness, rashes, lesions or ulcercations.  Neurological: Denies dizziness, difficulty with memory, difficulty with speech or problems with balance and coordination.  Psych: Denies anxiety, depression, SI/HI.  No other specific complaints in a complete review of systems (except as listed in HPI above).     Objective:   Physical Exam  BP 104/62 (BP Location: Left Arm, Patient Position: Sitting, Cuff Size: Large)   Pulse 77   Temp (!) 96.5 F (35.8 C) (Temporal)   Wt 178 lb (80.7 kg)   SpO2 99%   BMI 31.53 kg/m   Wt Readings from Last 3 Encounters:  09/08/22 180 lb 9.6 oz (81.9 kg)  07/18/22 179 lb (81.2 kg)  07/13/22 180 lb (81.6 kg)    General: Appears her stated age, obese, in NAD. Skin: Warm, dry and intact. No ulcerations noted. HEENT: Head: normal shape and size; Eyes: sclera white, no icterus, conjunctiva pink, PERRLA and EOMs intact;  Neck:  Neck supple, trachea midline. No masses, lumps or thyromegaly present.  Cardiovascular:  Normal rate and rhythm. S1,S2 noted.  No murmur, rubs or gallops noted. No JVD or BLE edema. No carotid bruits noted. Pulmonary/Chest: Normal effort and positive vesicular breath sounds. No respiratory distress. No wheezes, rales or ronchi noted.  Abdomen: Soft and nontender. Normal bowel sounds.  Musculoskeletal: No difficulty with gait.  Neurological: Alert and oriented. Coordination normal.  Psychiatric: Mood and affect normal. Behavior is normal. Judgment and thought content normal.   BMET    Component Value Date/Time   NA 138 07/18/2022 0931   NA 139 06/12/2019 0840   K 4.4 07/18/2022 0931   CL 103 07/18/2022 0931   CO2 26 07/18/2022 0931   GLUCOSE 279 (H) 07/18/2022 0931   BUN 19 07/18/2022 0931   BUN 28 (H) 06/12/2019 0840   CREATININE 1.25 (H) 07/18/2022 0931   CALCIUM 9.9 07/18/2022 0931   GFRNONAA 42 (L) 02/10/2022 0928   GFRAA >60 11/03/2019 1139    Lipid Panel     Component Value Date/Time   CHOL 152 07/18/2022 0931   TRIG 218 (H) 07/18/2022 0931   HDL 63 07/18/2022 0931   CHOLHDL 2.4 07/18/2022 0931   VLDL 53 (H) 08/31/2020 0922   LDLCALC 60 07/18/2022 0931    CBC    Component Value Date/Time   WBC 7.8 04/25/2021 0920   RBC 4.23 04/25/2021 0920   HGB 11.8 04/25/2021 0920   HCT 35.9 04/25/2021 0920   PLT 232 04/25/2021 0920   MCV 84.9 04/25/2021 0920   MCH 27.9 04/25/2021 0920   MCHC 32.9 04/25/2021 0920   RDW 14.6 04/25/2021 0920    Hgb A1C Lab Results  Component Value Date   HGBA1C 10.0 (A) 07/18/2022          Assessment & Plan:     RTC in 3 months, follow-up chronic conditions Nicki Reaper, NP

## 2022-10-19 ENCOUNTER — Ambulatory Visit: Payer: Medicare Other | Admitting: Cardiology

## 2022-10-23 ENCOUNTER — Ambulatory Visit (INDEPENDENT_AMBULATORY_CARE_PROVIDER_SITE_OTHER): Payer: Medicare Other

## 2022-10-23 DIAGNOSIS — I255 Ischemic cardiomyopathy: Secondary | ICD-10-CM | POA: Diagnosis not present

## 2022-10-25 LAB — CUP PACEART REMOTE DEVICE CHECK
Battery Remaining Longevity: 64 mo
Battery Remaining Percentage: 61 %
Battery Voltage: 2.96 V
Brady Statistic RV Percent Paced: 1 %
Date Time Interrogation Session: 20240715020016
HighPow Impedance: 79 Ohm
HighPow Impedance: 79 Ohm
Implantable Lead Connection Status: 753985
Implantable Lead Implant Date: 20200707
Implantable Lead Location: 753860
Implantable Pulse Generator Implant Date: 20200707
Lead Channel Impedance Value: 650 Ohm
Lead Channel Pacing Threshold Amplitude: 0.5 V
Lead Channel Pacing Threshold Pulse Width: 0.5 ms
Lead Channel Sensing Intrinsic Amplitude: 11.7 mV
Lead Channel Setting Pacing Amplitude: 2.5 V
Lead Channel Setting Pacing Pulse Width: 0.5 ms
Lead Channel Setting Sensing Sensitivity: 0.5 mV
Pulse Gen Serial Number: 9816687

## 2022-10-26 ENCOUNTER — Other Ambulatory Visit: Payer: Self-pay | Admitting: Internal Medicine

## 2022-10-27 NOTE — Telephone Encounter (Signed)
Requested medication (s) are due for refill today: routing for review  Requested medication (s) are on the active medication list: no  Last refill:  unknown  Future visit scheduled: yes  Notes to clinic:  Unable to refill per protocol, Rx expired. Medication is not on current list, routing for review.     Requested Prescriptions  Pending Prescriptions Disp Refills   LANTUS SOLOSTAR 100 UNIT/ML Solostar Pen [Pharmacy Med Name: Lantus SoloStar 100 UNIT/ML Subcutaneous Solution Pen-injector] 15 mL 3    Sig: INJECT SUBCUTANEOUSLY 10 UNITS  DAILY     Endocrinology:  Diabetes - Insulins Failed - 10/26/2022  4:51 AM      Failed - HBA1C is between 0 and 7.9 and within 180 days    Hemoglobin A1C  Date Value Ref Range Status  10/18/2022 9.1 (A) 4.0 - 5.6 % Final   Hgb A1c MFr Bld  Date Value Ref Range Status  04/25/2021 8.6 (H) <5.7 % of total Hgb Final    Comment:    For someone without known diabetes, a hemoglobin A1c value of 6.5% or greater indicates that they may have  diabetes and this should be confirmed with a follow-up  test. . For someone with known diabetes, a value <7% indicates  that their diabetes is well controlled and a value  greater than or equal to 7% indicates suboptimal  control. A1c targets should be individualized based on  duration of diabetes, age, comorbid conditions, and  other considerations. . Currently, no consensus exists regarding use of hemoglobin A1c for diagnosis of diabetes for children. Verna Czech - Valid encounter within last 6 months    Recent Outpatient Visits           1 week ago Benign essential hypertension   [place] [facility] [place], [place] W, NP   3 months ago Type 2 diabetes mellitus with other specified complication, without long-term current use of insulin [facility])   [place] [facility] [place], [place] W, NP   4 months ago Rash and nonspecific skin eruption   [place] [facility] [place], Salvadore Oxford, NP   7 months ago Estrogen deficiency   [place] [facility] [place], [place] W, NP   10 months ago Type 2 diabetes mellitus with other specified complication, without long-term current use of insulin [facility])   [place] [facility] [place], Salvadore Oxford, NP       Future Appointments             In 2 months Baity, Salvadore [place], NP  [facility] [place]   In 4 months Debbe Odea, MD [facility]

## 2022-11-03 ENCOUNTER — Other Ambulatory Visit: Payer: Self-pay

## 2022-11-03 MED ORDER — ENTRESTO 49-51 MG PO TABS: 1.0000 | ORAL_TABLET | Freq: Two times a day (BID) | ORAL | 1 refills | Status: DC

## 2022-11-03 NOTE — Telephone Encounter (Signed)
Requested Prescriptions   Signed Prescriptions Disp Refills   sacubitril-valsartan (ENTRESTO) 49-51 MG 180 tablet 1    Sig: Take 1 tablet by mouth 2 (two) times daily.    Authorizing Provider: Debbe Odea    Ordering User: Guerry Minors

## 2022-11-08 NOTE — Progress Notes (Signed)
Remote ICD transmission.   

## 2022-11-13 ENCOUNTER — Encounter: Payer: Self-pay | Admitting: Internal Medicine

## 2022-11-14 MED ORDER — TOUJEO MAX SOLOSTAR 300 UNIT/ML ~~LOC~~ SOPN: 14.0000 [IU] | PEN_INJECTOR | Freq: Every day | SUBCUTANEOUS | 0 refills | Status: DC

## 2022-12-05 ENCOUNTER — Telehealth: Payer: Self-pay | Admitting: Internal Medicine

## 2022-12-05 ENCOUNTER — Other Ambulatory Visit: Payer: Self-pay

## 2022-12-05 MED ORDER — TOUJEO MAX SOLOSTAR 300 UNIT/ML ~~LOC~~ SOPN: 14.0000 [IU] | PEN_INJECTOR | Freq: Every day | SUBCUTANEOUS | 0 refills | Status: DC

## 2022-12-05 NOTE — Telephone Encounter (Signed)
  RX sent to [facility].    Thanks,   Vernona Rieger     Outpatient Medication Detail   Disp Refills Start End   insulin glargine, 2 Unit Dial, (TOUJEO MAX SOLOSTAR) 300 UNIT/ML Solostar Pen 15 mL 0 12/05/2022 03/05/2023   Sig - Route: Inject 14 Units into the skin daily. - Subcutaneous   Sent to pharmacy as: insulin glargine, 2 Unit Dial, (TOUJEO MAX SOLOSTAR) 300 UNIT/ML Solostar Pen   E-Prescribing Status: Receipt confirmed by pharmacy (12/05/2022  2:56 PM EDT)

## 2022-12-05 NOTE — Telephone Encounter (Signed)
Medication Refill - Medication: insulin glargine, 2 Unit Dial, (TOUJEO MAX SOLOSTAR) 300 UNIT/ML Solostar Pen Pt is requesting 6 pens she has gotten before with mail order  Has the patient contacted their pharmacy? yes (Agent: If yes, when and what did the pharmacy advise?) she says was told she needs a new script for this  Preferred Pharmacy (with phone number or street name):  [facility] [place], [place] - [address] [place] Phone: [phone]  Fax: [phone]     Has the patient been seen for an appointment in the last year OR does the patient have an upcoming appointment? yes  Agent: Please be advised that RX refills may take up to 3 business days. We ask that you follow-up with your pharmacy.

## 2022-12-12 ENCOUNTER — Other Ambulatory Visit: Payer: Self-pay | Admitting: Internal Medicine

## 2022-12-14 NOTE — Telephone Encounter (Signed)
Requested Prescriptions  Pending Prescriptions Disp Refills   glipiZIDE (GLUCOTROL) 10 MG tablet [Pharmacy Med Name: glipiZIDE 10 MG Oral Tablet] 180 tablet 1    Sig: TAKE 1 TABLET BY MOUTH TWICE  DAILY BEFORE A MEAL     Endocrinology:  Diabetes - Sulfonylureas Failed - 12/12/2022 10:28 PM      Failed - HBA1C is between 0 and 7.9 and within 180 days    Hemoglobin A1C  Date Value Ref Range Status  10/18/2022 9.1 (A) 4.0 - 5.6 % Final   Hgb A1c MFr Bld  Date Value Ref Range Status  04/25/2021 8.6 (H) <5.7 % of total Hgb Final    Comment:    For someone without known diabetes, a hemoglobin A1c value of 6.5% or greater indicates that they may have  diabetes and this should be confirmed with a follow-up  test. . For someone with known diabetes, a value <7% indicates  that their diabetes is well controlled and a value  greater than or equal to 7% indicates suboptimal  control. A1c targets should be individualized based on  duration of diabetes, age, comorbid conditions, and  other considerations. . Currently, no consensus exists regarding use of hemoglobin A1c for diagnosis of diabetes for children. .          Failed - Cr in normal range and within 360 days    Creat  Date Value Ref Range Status  07/18/2022 1.25 (H) 0.50 - 1.05 mg/dL Final   Creatinine, Urine  Date Value Ref Range Status  03/27/2022 72 20 - 275 mg/dL Final         Passed - Valid encounter within last 6 months    Recent Outpatient Visits           1 month ago Benign essential hypertension   [place] [facility] [place], [place] W, NP   4 months ago Type 2 diabetes mellitus with other specified complication, without long-term current use of insulin [facility])   [place] [facility] [place], [place], NP   6 months ago Rash and nonspecific skin eruption   [place] [facility] [place], Salvadore Oxford, NP   8 months ago Estrogen deficiency   [place] [facility] [place], [place] W, NP   11 months ago Type 2 diabetes mellitus with other specified complication, without long-term current use of insulin [facility])   [place] [facility] [place], Salvadore Oxford, NP       Future Appointments             In 1 month Baity, Salvad[place], NP [place] [facility]   In 2 months Debbe Odea, MD [facility]

## 2023-01-01 ENCOUNTER — Encounter: Payer: Self-pay | Admitting: Internal Medicine

## 2023-01-01 DIAGNOSIS — R350 Frequency of micturition: Secondary | ICD-10-CM

## 2023-01-01 MED ORDER — OXYBUTYNIN CHLORIDE ER 5 MG PO TB24: ORAL_TABLET | ORAL | 1 refills | Status: DC

## 2023-01-18 ENCOUNTER — Encounter: Payer: Self-pay | Admitting: Internal Medicine

## 2023-01-18 ENCOUNTER — Ambulatory Visit (INDEPENDENT_AMBULATORY_CARE_PROVIDER_SITE_OTHER): Payer: Medicare Other | Admitting: Internal Medicine

## 2023-01-18 VITALS — BP 106/74 | HR 66 | Temp 95.5°F | Wt 179.0 lb

## 2023-01-18 DIAGNOSIS — Z23 Encounter for immunization: Secondary | ICD-10-CM | POA: Diagnosis not present

## 2023-01-18 DIAGNOSIS — Z0001 Encounter for general adult medical examination with abnormal findings: Secondary | ICD-10-CM

## 2023-01-18 DIAGNOSIS — E1165 Type 2 diabetes mellitus with hyperglycemia: Secondary | ICD-10-CM

## 2023-01-18 DIAGNOSIS — Z0000 Encounter for general adult medical examination without abnormal findings: Secondary | ICD-10-CM | POA: Diagnosis not present

## 2023-01-18 DIAGNOSIS — E6609 Other obesity due to excess calories: Secondary | ICD-10-CM

## 2023-01-18 NOTE — Patient Instructions (Signed)
Health Maintenance for Postmenopausal Women Menopause is a normal process in which your ability to get pregnant comes to an end. This process happens slowly over many months or years, usually between the ages of 48 and 55. Menopause is complete when you have missed your menstrual period for 12 months. It is important to talk with your health care provider about some of the most common conditions that affect women after menopause (postmenopausal women). These include heart disease, cancer, and bone loss (osteoporosis). Adopting a healthy lifestyle and getting preventive care can help to promote your health and wellness. The actions you take can also lower your chances of developing some of these common conditions. What are the signs and symptoms of menopause? During menopause, you may have the following symptoms: Hot flashes. These can be moderate or severe. Night sweats. Decrease in sex drive. Mood swings. Headaches. Tiredness (fatigue). Irritability. Memory problems. Problems falling asleep or staying asleep. Talk with your health care provider about treatment options for your symptoms. Do I need hormone replacement therapy? Hormone replacement therapy is effective in treating symptoms that are caused by menopause, such as hot flashes and night sweats. Hormone replacement carries certain risks, especially as you become older. If you are thinking about using estrogen or estrogen with progestin, discuss the benefits and risks with your health care provider. How can I reduce my risk for heart disease and stroke? The risk of heart disease, heart attack, and stroke increases as you age. One of the causes may be a change in the body's hormones during menopause. This can affect how your body uses dietary fats, triglycerides, and cholesterol. Heart attack and stroke are medical emergencies. There are many things that you can do to help prevent heart disease and stroke. Watch your blood pressure High  blood pressure causes heart disease and increases the risk of stroke. This is more likely to develop in people who have high blood pressure readings or are overweight. Have your blood pressure checked: Every 3-5 years if you are 18-39 years of age. Every year if you are 40 years old or older. Eat a healthy diet  Eat a diet that includes plenty of vegetables, fruits, low-fat dairy products, and lean protein. Do not eat a lot of foods that are high in solid fats, added sugars, or sodium. Get regular exercise Get regular exercise. This is one of the most important things you can do for your health. Most adults should: Try to exercise for at least 150 minutes each week. The exercise should increase your heart rate and make you sweat (moderate-intensity exercise). Try to do strengthening exercises at least twice each week. Do these in addition to the moderate-intensity exercise. Spend less time sitting. Even light physical activity can be beneficial. Other tips Work with your health care provider to achieve or maintain a healthy weight. Do not use any products that contain nicotine or tobacco. These products include cigarettes, chewing tobacco, and vaping devices, such as e-cigarettes. If you need help quitting, ask your health care provider. Know your numbers. Ask your health care provider to check your cholesterol and your blood sugar (glucose). Continue to have your blood tested as directed by your health care provider. Do I need screening for cancer? Depending on your health history and family history, you may need to have cancer screenings at different stages of your life. This may include screening for: Breast cancer. Cervical cancer. Lung cancer. Colorectal cancer. What is my risk for osteoporosis? After menopause, you may be   at increased risk for osteoporosis. Osteoporosis is a condition in which bone destruction happens more quickly than new bone creation. To help prevent osteoporosis or  the bone fractures that can happen because of osteoporosis, you may take the following actions: If you are 19-50 years old, get at least 1,000 mg of calcium and at least 600 international units (IU) of vitamin D per day. If you are older than age 50 but younger than age 70, get at least 1,200 mg of calcium and at least 600 international units (IU) of vitamin D per day. If you are older than age 70, get at least 1,200 mg of calcium and at least 800 international units (IU) of vitamin D per day. Smoking and drinking excessive alcohol increase the risk of osteoporosis. Eat foods that are rich in calcium and vitamin D, and do weight-bearing exercises several times each week as directed by your health care provider. How does menopause affect my mental health? Depression may occur at any age, but it is more common as you become older. Common symptoms of depression include: Feeling depressed. Changes in sleep patterns. Changes in appetite or eating patterns. Feeling an overall lack of motivation or enjoyment of activities that you previously enjoyed. Frequent crying spells. Talk with your health care provider if you think that you are experiencing any of these symptoms. General instructions See your health care provider for regular wellness exams and vaccines. This may include: Scheduling regular health, dental, and eye exams. Getting and maintaining your vaccines. These include: Influenza vaccine. Get this vaccine each year before the flu season begins. Pneumonia vaccine. Shingles vaccine. Tetanus, diphtheria, and pertussis (Tdap) booster vaccine. Your health care provider may also recommend other immunizations. Tell your health care provider if you have ever been abused or do not feel safe at home. Summary Menopause is a normal process in which your ability to get pregnant comes to an end. This condition causes hot flashes, night sweats, decreased interest in sex, mood swings, headaches, or lack  of sleep. Treatment for this condition may include hormone replacement therapy. Take actions to keep yourself healthy, including exercising regularly, eating a healthy diet, watching your weight, and checking your blood pressure and blood sugar levels. Get screened for cancer and depression. Make sure that you are up to date with all your vaccines. This information is not intended to replace advice given to you by your health care provider. Make sure you discuss any questions you have with your health care provider. Document Revised: 08/16/2020 Document Reviewed: 08/16/2020 [employer] Patient Education  2024 [employer]  

## 2023-01-18 NOTE — Assessment & Plan Note (Signed)
Encouraged diet and exercise for weight loss ?

## 2023-01-18 NOTE — Progress Notes (Addendum)
Subjective:    Patient ID: Monique Franklin, female    DOB: 02/18/1956, 67 y.o.   MRN: [id number]  HPI  Patient presents to clinic today for her subsequent annual Medicare wellness exam.  Past Medical History:  Diagnosis Date   Cardiomyopathy (HCC)    CHF (congestive heart failure) (HCC)    Class I   Coronary artery disease    Diabetes mellitus without complication (HCC)    GERD (gastroesophageal reflux disease)    HFrEF (heart failure with reduced ejection fraction) (HCC)    Hyperlipidemia    Hypertension    MI (myocardial infarction) (HCC)    PAD (peripheral artery disease) (HCC)     Current Outpatient Medications  Medication Sig Dispense Refill   aspirin EC 81 MG tablet Take 81 mg by mouth daily.     carvedilol (COREG) 25 MG tablet TAKE 1 TABLET BY MOUTH TWICE  DAILY 180 tablet 0   clobetasol ointment (TEMOVATE) 0.05 % Apply to affected area every night for 4 weeks, then every other day for 4 weeks and then twice a week for 4 weeks or until resolution. 30 g 5   FARXIGA 10 MG TABS tablet TAKE 1 TABLET BY MOUTH DAILY 90 tablet 3   glipiZIDE (GLUCOTROL) 10 MG tablet TAKE 1 TABLET BY MOUTH TWICE  DAILY BEFORE A MEAL 180 tablet 1   icosapent Ethyl (VASCEPA) 1 g capsule TAKE 2 CAPSULES BY MOUTH TWICE  DAILY 360 capsule 3   insulin glargine, 2 Unit Dial, (TOUJEO MAX SOLOSTAR) 300 UNIT/ML Solostar Pen Inject 14 Units into the skin daily. 15 mL 0   Insulin Pen Needle 31G X 5 MM MISC BD Pen Needles- brand specific Inject insulin via insulin pen 6 x daily 200 each 3   metFORMIN (GLUCOPHAGE) 1000 MG tablet TAKE ONE-HALF TABLET BY MOUTH  TWICE DAILY WITH A MEAL 90 tablet 1   Multiple Vitamin (MULTIVITAMIN) tablet Take 1 tablet by mouth daily.     oxybutynin (DITROPAN-XL) 5 MG 24 hr tablet TAKE 1 TABLET(5 MG) BY MOUTH AT BEDTIME 90 tablet 1   rosuvastatin (CRESTOR) 40 MG tablet TAKE 1 TABLET BY MOUTH DAILY AT  6PM 90 tablet 3   sacubitril-valsartan (ENTRESTO) 49-51 MG Take 1 tablet by mouth  2 (two) times daily. 180 tablet 1   No current facility-administered medications for this visit.    Allergies  Allergen Reactions   Chlorhexidine Itching    Only w/ wipes   Tape Rash   Ticagrelor Rash    Family History  Problem Relation Age of Onset   Heart attack Mother    COPD Father    Thyroid cancer Sister    Breast cancer Sister 28   Diabetes Sister    Diabetes Brother     Social History   Socioeconomic History   Marital status: Widowed    Spouse name: Not on file   Number of children: Not on file   Years of education: Not on file   Highest education level: Some college, no degree  Occupational History   Not on file  Tobacco Use   Smoking status: Former    Current packs/day: 0.00    Average packs/day: 1 pack/day for 33.0 years (33.0 ttl pk-yrs)    Types: Cigarettes    Start date: 36    Quit date: 2005    Years since quitting: 19.8   Smokeless tobacco: Never  Vaping Use   Vaping status: Never Used  Substance and Sexual Activity  Alcohol use: Yes    Comment: occasional   Drug use: Never   Sexual activity: Not Currently    Birth control/protection: Post-menopausal  Other Topics Concern   Not on file  Social History Narrative   Not on file   Social Determinants of Health   Financial Resource Strain: Low Risk  (10/14/2022)   Overall Financial Resource Strain (CARDIA)    Difficulty of Paying Living Expenses: Not hard at all  Food Insecurity: No Food Insecurity (10/14/2022)   Hunger Vital Sign    Worried About Running Out of Food in the Last Year: Never true    Ran Out of Food in the Last Year: Never true  Transportation Needs: No Transportation Needs (10/14/2022)   PRAPARE - Administrator, Civil Service (Medical): No    Lack of Transportation (Non-Medical): No  Physical Activity: Unknown (10/14/2022)   Exercise Vital Sign    Days of Exercise per Week: Patient declined    Minutes of Exercise per Session: 20 min  Stress: No Stress Concern  Present (10/14/2022)   [employer] of Occupational Health - Occupational Stress Questionnaire    Feeling of Stress : Not at all  Social Connections: Socially Isolated (10/14/2022)   Social Connection and Isolation Panel [NHANES]    Frequency of Communication with Friends and Family: More than three times a week    Frequency of Social Gatherings with Friends and Family: Once a week    Attends Religious Services: Never    Database administrator or Organizations: No    Attends Banker Meetings: Never    Marital Status: Widowed  Intimate Partner Violence: Not At Risk (07/13/2022)   Humiliation, Afraid, Rape, and Kick questionnaire    Fear of Current or Ex-Partner: No    Emotionally Abused: No    Physically Abused: No    Sexually Abused: No    Hospitiliaztions: None  Health Maintenance:   Flu: 12/2021 Tetanus: > 10 years COVID: X 3 Pneumovax: Never Prevnar 20: 10/2020 Shingrix: 06/2018, 08/2018 Pap smear: 09/2019 Mammogram: 07/2022 Bone density: 07/2022 Colon screening: 01/2022 Vision screening: annually Dentist: biannually   Providers:   PCP: Nicki Reaper, NP  Dermatologist: Dr. Gwen Pounds  Cardiologist: Dr. Graciela Husbands  GYN: Dr. Gaynelle Arabian  Gastroenterologist: Dr. Tobi Bastos     I have personally reviewed and have noted:  1. The patient's medical and social history 2. Their use of alcohol, tobacco or illicit drugs 3. Their current medications and supplements 4. The patient's functional ability including ADL's, fall risks, home safety risks and  hearing or visual impairment. 5. Diet and physical activities 6. Evidence for depression or mood disorder    Review of Systems     Past Medical History:  Diagnosis Date   Cardiomyopathy (HCC)    CHF (congestive heart failure) (HCC)    Class I   Coronary artery disease    Diabetes mellitus without complication (HCC)    GERD (gastroesophageal reflux disease)    HFrEF (heart failure with reduced ejection fraction) (HCC)     Hyperlipidemia    Hypertension    MI (myocardial infarction) (HCC)    PAD (peripheral artery disease) (HCC)     Current Outpatient Medications  Medication Sig Dispense Refill   aspirin EC 81 MG tablet Take 81 mg by mouth daily.     carvedilol (COREG) 25 MG tablet TAKE 1 TABLET BY MOUTH TWICE  DAILY 180 tablet 0   clobetasol ointment (TEMOVATE) 0.05 % Apply to affected area every  night for 4 weeks, then every other day for 4 weeks and then twice a week for 4 weeks or until resolution. 30 g 5   Continuous Blood Gluc Receiver (DEXCOM G7 RECEIVER) DEVI 1 Device by Does not apply route 3 (three) times daily. 1 each 0   FARXIGA 10 MG TABS tablet TAKE 1 TABLET BY MOUTH DAILY 90 tablet 3   glipiZIDE (GLUCOTROL) 10 MG tablet TAKE 1 TABLET BY MOUTH TWICE  DAILY BEFORE A MEAL 180 tablet 1   icosapent Ethyl (VASCEPA) 1 g capsule TAKE 2 CAPSULES BY MOUTH TWICE  DAILY 360 capsule 3   insulin glargine, 2 Unit Dial, (TOUJEO MAX SOLOSTAR) 300 UNIT/ML Solostar Pen Inject 14 Units into the skin daily. 15 mL 0   Insulin Pen Needle 31G X 5 MM MISC BD Pen Needles- brand specific Inject insulin via insulin pen 6 x daily 200 each 3   metFORMIN (GLUCOPHAGE) 1000 MG tablet TAKE ONE-HALF TABLET BY MOUTH  TWICE DAILY WITH A MEAL 90 tablet 1   Multiple Vitamin (MULTIVITAMIN) tablet Take 1 tablet by mouth daily.     oxybutynin (DITROPAN-XL) 5 MG 24 hr tablet TAKE 1 TABLET(5 MG) BY MOUTH AT BEDTIME 90 tablet 1   rosuvastatin (CRESTOR) 40 MG tablet TAKE 1 TABLET BY MOUTH DAILY AT  6PM 90 tablet 3   sacubitril-valsartan (ENTRESTO) 49-51 MG Take 1 tablet by mouth 2 (two) times daily. 180 tablet 1   No current facility-administered medications for this visit.    Allergies  Allergen Reactions   Chlorhexidine Itching    Only w/ wipes   Tape Rash   Ticagrelor Rash    Family History  Problem Relation Age of Onset   Heart attack Mother    COPD Father    Thyroid cancer Sister    Breast cancer Sister 44   Diabetes  Sister    Diabetes Brother     Social History   Socioeconomic History   Marital status: Widowed    Spouse name: Not on file   Number of children: Not on file   Years of education: Not on file   Highest education level: Some college, no degree  Occupational History   Not on file  Tobacco Use   Smoking status: Former    Current packs/day: 0.00    Average packs/day: 1 pack/day for 33.0 years (33.0 ttl pk-yrs)    Types: Cigarettes    Start date: 19    Quit date: 2005    Years since quitting: 19.7   Smokeless tobacco: Never  Vaping Use   Vaping status: Never Used  Substance and Sexual Activity   Alcohol use: Yes    Comment: occasional   Drug use: Never   Sexual activity: Not Currently    Birth control/protection: Post-menopausal  Other Topics Concern   Not on file  Social History Narrative   Not on file   Social Determinants of Health   Financial Resource Strain: Low Risk  (10/14/2022)   Overall Financial Resource Strain (CARDIA)    Difficulty of Paying Living Expenses: Not hard at all  Food Insecurity: No Food Insecurity (10/14/2022)   Hunger Vital Sign    Worried About Running Out of Food in the Last Year: Never true    Ran Out of Food in the Last Year: Never true  Transportation Needs: No Transportation Needs (10/14/2022)   PRAPARE - Administrator, Civil Service (Medical): No    Lack of Transportation (Non-Medical): No  Physical  Activity: Unknown (10/14/2022)   Exercise Vital Sign    Days of Exercise per Week: Patient declined    Minutes of Exercise per Session: 20 min  Stress: No Stress Concern Present (10/14/2022)   [employer] of Occupational Health - Occupational Stress Questionnaire    Feeling of Stress : Not at all  Social Connections: Socially Isolated (10/14/2022)   Social Connection and Isolation Panel [NHANES]    Frequency of Communication with Friends and Family: More than three times a week    Frequency of Social Gatherings with Friends and  Family: Once a week    Attends Religious Services: Never    Database administrator or Organizations: No    Attends Banker Meetings: Never    Marital Status: Widowed  Intimate Partner Violence: Not At Risk (07/13/2022)   Humiliation, Afraid, Rape, and Kick questionnaire    Fear of Current or Ex-Partner: No    Emotionally Abused: No    Physically Abused: No    Sexually Abused: No     Constitutional: Denies fever, malaise, fatigue, headache or abrupt weight changes.  HEENT: Denies eye pain, eye redness, ear pain, ringing in the ears, wax buildup, runny nose, nasal congestion, bloody nose, or sore throat. Respiratory: Denies difficulty breathing, shortness of breath, cough or sputum production.   Cardiovascular: Denies chest pain, chest tightness, palpitations or swelling in the hands or feet.  Gastrointestinal: Patient reports intermittent constipation.  Denies abdominal pain, bloating, constipation, diarrhea or blood in the stool.  GU: Patient reports urinary frequency.  Denies urgency, pain with urination, burning sensation, blood in urine, odor or discharge. Musculoskeletal: Denies decrease in range of motion, difficulty with gait, muscle pain or joint pain and swelling.  Skin: Denies redness, rashes, lesions or ulcercations.  Neurological: Denies dizziness, difficulty with memory, difficulty with speech or problems with balance and coordination.  Psych: Denies anxiety, depression, SI/HI.  No other specific complaints in a complete review of systems (except as listed in HPI above).  Objective:   Physical Exam   BP 106/74 (BP Location: Right Arm, Patient Position: Sitting, Cuff Size: Normal)   Pulse 66   Temp (!) 95.5 F (35.3 C) (Temporal)   Wt 179 lb (81.2 kg)   SpO2 98%   BMI 31.71 kg/m   Wt Readings from Last 3 Encounters:  10/18/22 178 lb (80.7 kg)  09/08/22 180 lb 9.6 oz (81.9 kg)  07/18/22 179 lb (81.2 kg)    General: Appears her stated age, obese, in  NAD. Skin: Warm, dry and intact. No ulcerations noted. HEENT: Head: normal shape and size; Eyes: sclera white, no icterus, conjunctiva pink, PERRLA and EOMs intact;  Neck:  Neck supple, trachea midline. No masses, lumps or thyromegaly present.  Cardiovascular: Normal rate and rhythm. S1,S2 noted.  No murmur, rubs or gallops noted. No JVD or BLE edema. No carotid bruits noted. Pulmonary/Chest: Normal effort and positive vesicular breath sounds. No respiratory distress. No wheezes, rales or ronchi noted.  Abdomen: Soft and nontender. Normal bowel sounds.  Musculoskeletal: Kyphotic.  Strength 5/5 BUE/BLE.  No difficulty with gait.  Neurological: Alert and oriented. Cranial nerves II-XII grossly intact. Coordination normal.  Psychiatric: Mood and affect normal. Behavior is normal. Judgment and thought content normal.     BMET    Component Value Date/Time   NA 138 07/18/2022 0931   NA 139 06/12/2019 0840   K 4.4 07/18/2022 0931   CL 103 07/18/2022 0931   CO2 26 07/18/2022 0931  GLUCOSE 279 (H) 07/18/2022 0931   BUN 19 07/18/2022 0931   BUN 28 (H) 06/12/2019 0840   CREATININE 1.25 (H) 07/18/2022 0931   CALCIUM 9.9 07/18/2022 0931   GFRNONAA 42 (L) 02/10/2022 0928   GFRAA >60 11/03/2019 1139    Lipid Panel     Component Value Date/Time   CHOL 152 07/18/2022 0931   TRIG 218 (H) 07/18/2022 0931   HDL 63 07/18/2022 0931   CHOLHDL 2.4 07/18/2022 0931   VLDL 53 (H) 08/31/2020 0922   LDLCALC 60 07/18/2022 0931    CBC    Component Value Date/Time   WBC 7.8 04/25/2021 0920   RBC 4.23 04/25/2021 0920   HGB 11.8 04/25/2021 0920   HCT 35.9 04/25/2021 0920   PLT 232 04/25/2021 0920   MCV 84.9 04/25/2021 0920   MCH 27.9 04/25/2021 0920   MCHC 32.9 04/25/2021 0920   RDW 14.6 04/25/2021 0920    Hgb A1C Lab Results  Component Value Date   HGBA1C 9.1 (A) 10/18/2022           Assessment & Plan:   Medicare wellness visit:   Diet: DM if diabetic Physical activity:  Sedentary Depression/mood screen: Negative Hearing: Intact to whispered voice Visual acuity: Grossly normal, performs annual eye exam  ADLs: Capable Fall risk: None Home safety: Good Cognitive evaluation: Intact to orientation, naming, recall and repetition EOL planning: Adv directives, full code/I agree  Preventative Medicine:  Flu shot today She declines tetanus for financial reasons, advised her if she gets better To go get this done Prevnar 20 UTD, will not need Pneumovax Encouraged her to get her COVID-booster Shingrix UTD She no longer needs to screen for cervical cancer Mammogram UTD Bone density UTD Colon screening UTD Encouraged her to consume a balanced diet and exercise regimen Advised her to see an eye doctor and dentist annually Will check CBC, c-Met, lipid, A1c and urine microalbumin today  Next appointment:  RTC in 6 months, follow-up chronic conditions Nicki Reaper, NP

## 2023-01-19 ENCOUNTER — Encounter: Payer: Self-pay | Admitting: Internal Medicine

## 2023-01-19 DIAGNOSIS — E1165 Type 2 diabetes mellitus with hyperglycemia: Secondary | ICD-10-CM

## 2023-01-19 LAB — CBC
HCT: 37.7 % (ref 35.0–45.0)
Hemoglobin: 12 g/dL (ref 11.7–15.5)
MCH: 27.5 pg (ref 27.0–33.0)
MCHC: 31.8 g/dL — ABNORMAL LOW (ref 32.0–36.0)
MCV: 86.3 fL (ref 80.0–100.0)
MPV: 10.1 fL (ref 7.5–12.5)
Platelets: 248 10*3/uL (ref 140–400)
RBC: 4.37 10*6/uL (ref 3.80–5.10)
RDW: 14.4 % (ref 11.0–15.0)
WBC: 8.4 10*3/uL (ref 3.8–10.8)

## 2023-01-19 LAB — MICROALBUMIN / CREATININE URINE RATIO
Creatinine, Urine: 81 mg/dL (ref 20–275)
Microalb Creat Ratio: 162 mg/g{creat} — ABNORMAL HIGH (ref <30)
Microalb, Ur: 13.1 mg/dL

## 2023-01-19 LAB — COMPLETE METABOLIC PANEL WITH GFR
AG Ratio: 1.3 (calc) (ref 1.0–2.5)
ALT: 12 U/L (ref 6–29)
AST: 10 U/L (ref 10–35)
Albumin: 4.1 g/dL (ref 3.6–5.1)
Alkaline phosphatase (APISO): 51 U/L (ref 37–153)
BUN/Creatinine Ratio: 18 (calc) (ref 6–22)
BUN: 26 mg/dL — ABNORMAL HIGH (ref 7–25)
CO2: 23 mmol/L (ref 20–32)
Calcium: 9.6 mg/dL (ref 8.6–10.4)
Chloride: 106 mmol/L (ref 98–110)
Creat: 1.47 mg/dL — ABNORMAL HIGH (ref 0.50–1.05)
Globulin: 3.2 g/dL (ref 1.9–3.7)
Glucose, Bld: 223 mg/dL — ABNORMAL HIGH (ref 65–99)
Potassium: 4.5 mmol/L (ref 3.5–5.3)
Sodium: 139 mmol/L (ref 135–146)
Total Bilirubin: 0.3 mg/dL (ref 0.2–1.2)
Total Protein: 7.3 g/dL (ref 6.1–8.1)
eGFR: 39 mL/min/{1.73_m2} — ABNORMAL LOW (ref >=60)

## 2023-01-19 LAB — LIPID PANEL
Cholesterol: 133 mg/dL (ref <200)
HDL: 50 mg/dL (ref >=50)
LDL Cholesterol (Calc): 56 mg/dL
Non-HDL Cholesterol (Calc): 83 mg/dL (ref <130)
Total CHOL/HDL Ratio: 2.7 (calc) (ref <5.0)
Triglycerides: 195 mg/dL — ABNORMAL HIGH (ref <150)

## 2023-01-19 LAB — HEMOGLOBIN A1C
Hgb A1c MFr Bld: 9.1 %{Hb} — ABNORMAL HIGH (ref <5.7)
Mean Plasma Glucose: 214 mg/dL
eAG (mmol/L): 11.9 mmol/L

## 2023-01-22 ENCOUNTER — Ambulatory Visit: Payer: Medicare Other

## 2023-01-22 DIAGNOSIS — I255 Ischemic cardiomyopathy: Secondary | ICD-10-CM

## 2023-01-22 DIAGNOSIS — I5022 Chronic systolic (congestive) heart failure: Secondary | ICD-10-CM

## 2023-01-23 NOTE — Addendum Note (Signed)
Addended by: Lorre Munroe on: 01/23/2023 10:57 AM   Modules accepted: Level of Service

## 2023-01-24 LAB — CUP PACEART REMOTE DEVICE CHECK
Battery Remaining Longevity: 62 mo
Battery Remaining Percentage: 60 %
Battery Voltage: 2.96 V
Brady Statistic RV Percent Paced: 1 %
Date Time Interrogation Session: 20241014020032
HighPow Impedance: 92 Ohm
HighPow Impedance: 92 Ohm
Implantable Lead Connection Status: 753985
Implantable Lead Implant Date: 20200707
Implantable Lead Location: 753860
Implantable Pulse Generator Implant Date: 20200707
Lead Channel Impedance Value: 630 Ohm
Lead Channel Pacing Threshold Amplitude: 0.5 V
Lead Channel Pacing Threshold Pulse Width: 0.5 ms
Lead Channel Sensing Intrinsic Amplitude: 11.7 mV
Lead Channel Setting Pacing Amplitude: 2.5 V
Lead Channel Setting Pacing Pulse Width: 0.5 ms
Lead Channel Setting Sensing Sensitivity: 0.5 mV
Pulse Gen Serial Number: 9816687

## 2023-02-02 ENCOUNTER — Other Ambulatory Visit (HOSPITAL_COMMUNITY): Payer: Self-pay | Admitting: Cardiology

## 2023-02-08 NOTE — Progress Notes (Signed)
Remote ICD transmission.   

## 2023-02-27 ENCOUNTER — Other Ambulatory Visit (HOSPITAL_COMMUNITY): Payer: Self-pay | Admitting: Cardiology

## 2023-03-12 ENCOUNTER — Encounter: Payer: Self-pay | Admitting: Cardiology

## 2023-03-12 ENCOUNTER — Ambulatory Visit: Payer: Medicare Other | Attending: Cardiology | Admitting: Cardiology

## 2023-03-12 VITALS — BP 104/60 | HR 63 | Ht 63.0 in | Wt 181.6 lb

## 2023-03-12 DIAGNOSIS — Z95810 Presence of automatic (implantable) cardiac defibrillator: Secondary | ICD-10-CM | POA: Diagnosis not present

## 2023-03-12 DIAGNOSIS — I2510 Atherosclerotic heart disease of native coronary artery without angina pectoris: Secondary | ICD-10-CM | POA: Insufficient documentation

## 2023-03-12 DIAGNOSIS — I255 Ischemic cardiomyopathy: Secondary | ICD-10-CM | POA: Diagnosis not present

## 2023-03-12 NOTE — Progress Notes (Signed)
Cardiology Office Note:    Date:  03/12/2023   ID:  Monique Franklin, DOB 1956/03/12, MRN [id number]  PCP:  Lorre Munroe, NP  Cardiologist:  Debbe Odea, MD  Electrophysiologist:  None   Referring MD: Lorre Munroe, NP   Chief Complaint  Patient presents with   Follow-up    Patient denies new or acute cardiac problems/concerns today.      History of Present Illness:    Monique Franklin is a 67 y.o. female with a hx of hypertension, hyperlipidemia, former smoker x20+ years, CAD, PCI to LAD and LCx(5 stents total), status post CABG x2 (LIMA to LAD, SVG to OM 2019), HFrEF EF35-40%, s/p ICD 2020(St. Jude's device), PAD(left femoral thrombo-endarterectomy with patch angioplasty 12/2017) who presents for follow-up.  Doing okay apart from her diabetes not currently well-controlled.  Plans to see endocrinologist next week.  Will consider starting Mounjaro as per endocrinologist.  Denies chest pain, shortness of breath, edema.  Compliant with medications as prescribed.  Feels well, no concerns at this time.   Prior notes Echo 03/2022 EF 30 to 35% Echo 02/2021 EF 35 to 40% Echo 01/2019 EF 30 to 35% Patient was originally seen to establish care.  She used to be followed at [facility] and later moved into the area.   After her CABG in 2019, she had a left femoral artery complication requiring left femoral patch angioplasty.  Earlier in 2020, she had an ICD placed due to low EF with ejection fraction of 31%.      Past Medical History:  Diagnosis Date   Cardiomyopathy (HCC)    CHF (congestive heart failure) (HCC)    Class I   Coronary artery disease    Diabetes mellitus without complication (HCC)    GERD (gastroesophageal reflux disease)    HFrEF (heart failure with reduced ejection fraction) (HCC)    Hyperlipidemia    Hypertension    MI (myocardial infarction) (HCC)    PAD (peripheral artery disease) (HCC)     Past Surgical History:  Procedure Laterality Date   BREAST  BIOPSY Bilateral 2016   neg   CARDIAC CATHETERIZATION     CARDIAC DEFIBRILLATOR PLACEMENT     CATARACT EXTRACTION Bilateral    CATARACT EXTRACTION W/PHACO Right 11/09/2020   Procedure: CATARACT EXTRACTION PHACO AND INTRAOCULAR LENS PLACEMENT (IOC) RIGHT VIVITY  DIABETIC;  Surgeon: Galen Manila, MD;  Location: [facility];  Service: Ophthalmology;  Laterality: Right;  2.85 00:33.2   CATARACT EXTRACTION W/PHACO Left 11/30/2020   Procedure: CATARACT EXTRACTION PHACO AND INTRAOCULAR LENS PLACEMENT (IOC) LEFT DIABETIC VIVITY 3.86 00:28.3;  Surgeon: Galen Manila, MD;  Location[facility];  Service: Ophthalmology;  Laterality: Left;   COLONOSCOPY WITH PROPOFOL N/A 10/31/2019   Procedure: COLONOSCOPY WITH PROPOFOL;  Surgeon: Pasty Spillers, MD;  Location: [facility] ENDOSCOPY;  Service: Endoscopy;  Laterality: N/A;  priority 4   COLONOSCOPY WITH PROPOFOL N/A 01/19/2022   Procedure: COLONOSCOPY WITH PROPOFOL;  Surgeon: Wyline Mood, MD;  Location: [facility] END[facility]COPY;  Service: Gastroenterology;  Laterality: N/A;   COMBINED AUGMENTATION MAMMAPLASTY AND ABDOMINOPLASTY     CORONARY ARTERY BYPASS GRAFT  08/2017   X2 with LIMA to LAD; 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;Severe 3 vessel CAD-PCI to LAD and LC x(5stents) Coronary dissection of LM to LAD    CORONARY ARTERY BYPASS GRAFT     CORONARY STENT PLACEMENT     PERCUTANEOUS CORONARY STENT INTERVENTION (PCI-S)     REDUCTION MAMMAPLASTY Bilateral 2017   TONSILLECTOMY  Current Medications: Current Meds  Medication Sig   aspirin EC 81 MG tablet Take 81 mg by mouth daily.   carvedilol (COREG) 25 MG tablet TAKE 1 TABLET BY MOUTH TWICE  DAILY   clobetasol ointment (TEMOVATE) 0.05 % Apply to affected area every night for 4 weeks, then every other day for 4 weeks and then twice a week for 4 weeks or until resolution.   FARXIGA 10 MG TABS tablet TAKE 1 TABLET BY MOUTH DAILY   glipiZIDE (GLUCOTROL) 10 MG tablet TAKE 1 TABLET BY MOUTH  TWICE  DAILY BEFORE A MEAL   icosapent Ethyl (VASCEPA) 1 g capsule TAKE 2 CAPSULES BY MOUTH TWICE  DAILY   insulin glargine, 2 Unit Dial, (TOUJEO MAX SOLOSTAR) 300 UNIT/ML Solostar Pen Inject 14 Units into the skin daily.   Insulin Pen Needle 31G X 5 MM MISC BD Pen Needles- brand specific Inject insulin via insulin pen 6 x daily   metFORMIN (GLUCOPHAGE) 1000 MG tablet TAKE ONE-HALF TABLET BY MOUTH  TWICE DAILY WITH A MEAL   Multiple Vitamin (MULTIVITAMIN) tablet Take 1 tablet by mouth daily.   oxybutynin (DITROPAN-XL) 5 MG 24 hr tablet TAKE 1 TABLET(5 MG) BY MOUTH AT BEDTIME   rosuvastatin (CRESTOR) 40 MG tablet Take 1 tablet (40 mg total) by mouth daily. Needs appt for further refills   sacubitril-valsartan (ENTRESTO) 49-51 MG Take 1 tablet by mouth 2 (two) times daily.     Allergies:   Chlorhexidine, Tape, and Ticagrelor   Social History   Socioeconomic History   Marital status: Widowed    Spouse name: Not on file   Number of children: Not on file   Years of education: Not on file   Highest education level: Some college, no degree  Occupational History   Not on file  Tobacco Use   Smoking status: Former    Current packs/day: 0.00    Average packs/day: 1 pack/day for 33.0 years (33.0 ttl pk-yrs)    Types: Cigarettes    Start date: 62    Quit date: 2005    Years since quitting: 19.9   Smokeless tobacco: Never  Vaping Use   Vaping status: Never Used  Substance and Sexual Activity   Alcohol use: Yes    Comment: occasional   Drug use: Never   Sexual activity: Not Currently    Birth control/protection: Post-menopausal  Other Topics Concern   Not on file  Social History Narrative   Not on file   Social Determinants of Health   Financial Resource Strain: Low Risk  (10/14/2022)   Overall Financial Resource Strain (CARDIA)    Difficulty of Paying Living Expenses: Not hard at all  Food Insecurity: No Food Insecurity (10/14/2022)   Hunger Vital Sign    Worried About Running  Out of Food in the Last Year: Never true    Ran Out of Food in the Last Year: Never true  Transportation Needs: No Transportation Needs (10/14/2022)   PRAPARE - Administrator, Civil Service (Medical): No    Lack of Transportation (Non-Medical): No  Physical Activity: Unknown (10/14/2022)   Exercise Vital Sign    Days of Exercise per Week: Patient declined    Minutes of Exercise per Session: 20 min  Stress: No Stress Concern Present (10/14/2022)   [employer] of Occupational Health - Occupational Stress Questionnaire    Feeling of Stress : Not at all  Social Connections: Socially Isolated (10/14/2022)   Social Connection and Isolation Panel [NHANES]  Frequency of Communication with Friends and Family: More than three times a week    Frequency of Social Gatherings with Friends and Family: Once a week    Attends Religious Services: Never    Database administrator or Organizations: No    Attends Banker Meetings: Never    Marital Status: Widowed     Family History: The patient's family history includes Breast cancer (age of onset: 51) in her sister; COPD in her father; Diabetes in her brother and sister; Heart attack in her mother; Thyroid cancer in her sister.  ROS:   Please see the history of present illness.     All other systems reviewed and are negative.  EKGs/Labs/Other Studies Reviewed:    The following studies were reviewed today:  EKG Interpretation Date/Time:  Monday March 12 2023 08:38:39 EST Ventricular Rate:  63 PR Interval:  174 QRS Duration:  88 QT Interval:  408 QTC Calculation: 417 R Axis:   -16  Text Interpretation: Normal sinus rhythm Low voltage QRS Nonspecific ST and T wave abnormality Confirmed by Debbe Odea ([id number]) on 03/12/2023 8:43:02 AM    Recent Labs: 01/18/2023: ALT 12; BUN 26; Creat 1.47; Hemoglobin 12.0; Platelets 248; Potassium 4.5; Sodium 139  Recent Lipid Panel    Component Value Date/Time   CHOL 133  01/18/2023 0900   TRIG 195 (H) 01/18/2023 0900   HDL 50 01/18/2023 0900   CHOLHDL 2.7 01/18/2023 0900   VLDL 53 (H) 08/31/2020 0922   LDLCALC 56 01/18/2023 0900   LDLDIRECT 59.0 05/12/2020 1537    Physical Exam:    VS:  BP 104/60 (BP Location: Left Arm, Patient Position: Sitting, Cuff Size: Large)   Pulse 63   Ht 5\' 3"  (1.6 m)   Wt 181 lb 9.6 oz (82.4 kg)   SpO2 96%   BMI 32.17 kg/m     Wt Readings from Last 3 Encounters:  03/12/23 181 lb 9.6 oz (82.4 kg)  01/18/23 179 lb (81.2 kg)  10/18/22 178 lb (80.7 kg)     GEN:  Well nourished, well developed in no acute distress HEENT: Normal NECK: No JVD; No carotid bruits CARDIAC: RRR, no murmurs, rubs, gallops RESPIRATORY:  Clear to auscultation without rales, wheezing or rhonchi  ABDOMEN: Soft, non-tender, non-distended MUSCULOSKELETAL:  No edema; No deformity  SKIN: Warm and dry NEUROLOGIC:  Alert and oriented x 3 PSYCHIATRIC:  Normal affect   ASSESSMENT:    1. Ischemic cardiomyopathy   2. Coronary artery disease involving native coronary artery of native heart without angina pectoris   3. ICD (implantable cardioverter-defibrillator) in place    PLAN:    In order of problems listed above:  Ischemic cardiomyopathy EF 35 on echo 12/23, s/p ICD.  NYHA class II symptoms.  Denies shortness of breath, euvolemic on exam.  Continue Coreg 25mg  twice daily, Entresto 49-51 mg twice daily, Farxiga 10 mg daily. Aldactone previously stopped due to low blood pressures and worsening renal function.   CAD, PCI's, CABG x2 in 2019.  Denies chest pain. continue aspirin 81 mg, Crestor 40 mg daily. S/p AICD.  Follows up with device clinic.  Follow-up in 6-12 months   Medication Adjustments/Labs and Tests Ordered: Current medicines are reviewed at length with the patient today.  Concerns regarding medicines are outlined above.  Orders Placed This Encounter  Procedures   EKG 12-Lead   ECHOCARDIOGRAM COMPLETE    No orders of the  defined types were placed in this encounter.  Patient Instructions  Medication Instructions:  No changes at this time.   *If you need a refill on your cardiac medications before your next appointment, please call your pharmacy*   Lab Work: None  If you have labs (blood work) drawn today and your tests are completely normal, you will receive your results only by: MyChart Message (if you have MyChart) OR A paper copy in the mail If you have any lab test that is abnormal or we need to change your treatment, we will call you to review the results.   Testing/Procedures: Your physician has requested that you have an echocardiogram in 1 year.   Echocardiography is a painless test that uses sound waves to create images of your heart. It provides your doctor with information about the size and shape of your heart and how well your heart's chambers and valves are working. This procedure takes approximately one hour. There are no restrictions for this procedure. Please do NOT wear cologne, perfume, aftershave, or lotions (deodorant is allowed). Please arrive 15 minutes prior to your appointment time.  Please note: We ask at that you not bring children with you during ultrasound (echo/ vascular) testing. Due to room size and safety concerns, children are not allowed in the ultrasound rooms during exams. Our front office staff cannot provide observation of children in our lobby area while testing is being conducted. An adult accompanying a patient to their appointment will only be allowed in the ultrasound room at the discretion of the ultrasound technician under special circumstances. We apologize for any inconvenience.    Follow-Up: At [facility], you and your health needs are our priority.  As part of our continuing mission to provide you with exceptional heart care, we have created designated Provider Care Teams.  These Care Teams include your primary Cardiologist (physician) and  Advanced Practice Providers (APPs -  Physician Assistants and Nurse Practitioners) who all work together to provide you with the care you need, when you need it.   Your next appointment:   1 year(s) after echocardiogram has been done in 1 year  Provider:   Debbe Odea, MD    Signed, Debbe Odea, MD  03/12/2023 9:51 AM    [facility]

## 2023-03-12 NOTE — Patient Instructions (Addendum)
Medication Instructions:  No changes at this time.   *If you need a refill on your cardiac medications before your next appointment, please call your pharmacy*   Lab Work: None  If you have labs (blood work) drawn today and your tests are completely normal, you will receive your results only by: MyChart Message (if you have MyChart) OR A paper copy in the mail If you have any lab test that is abnormal or we need to change your treatment, we will call you to review the results.   Testing/Procedures: Your physician has requested that you have an echocardiogram in 1 year.   Echocardiography is a painless test that uses sound waves to create images of your heart. It provides your doctor with information about the size and shape of your heart and how well your heart's chambers and valves are working. This procedure takes approximately one hour. There are no restrictions for this procedure. Please do NOT wear cologne, perfume, aftershave, or lotions (deodorant is allowed). Please arrive 15 minutes prior to your appointment time.  Please note: We ask at that you not bring children with you during ultrasound (echo/ vascular) testing. Due to room size and safety concerns, children are not allowed in the ultrasound rooms during exams. Our front office staff cannot provide observation of children in our lobby area while testing is being conducted. An adult accompanying a patient to their appointment will only be allowed in the ultrasound room at the discretion of the ultrasound technician under special circumstances. We apologize for any inconvenience.    Follow-Up: At [facility], you and your health needs are our priority.  As part of our continuing mission to provide you with exceptional heart care, we have created designated Provider Care Teams.  These Care Teams include your primary Cardiologist (physician) and Advanced Practice Providers (APPs -  Physician Assistants and Nurse  Practitioners) who all work together to provide you with the care you need, when you need it.   Your next appointment:   1 year(s) after echocardiogram has been done in 1 year  Provider:   Debbe Odea, MD

## 2023-04-13 ENCOUNTER — Other Ambulatory Visit (HOSPITAL_COMMUNITY): Payer: Self-pay | Admitting: Cardiology

## 2023-04-13 ENCOUNTER — Other Ambulatory Visit: Payer: Self-pay | Admitting: *Deleted

## 2023-04-13 MED ORDER — ENTRESTO 49-51 MG PO TABS: 1.0000 | ORAL_TABLET | Freq: Two times a day (BID) | ORAL | 3 refills | Status: DC

## 2023-04-23 ENCOUNTER — Ambulatory Visit (INDEPENDENT_AMBULATORY_CARE_PROVIDER_SITE_OTHER): Payer: Medicare Other

## 2023-04-23 DIAGNOSIS — I255 Ischemic cardiomyopathy: Secondary | ICD-10-CM | POA: Diagnosis not present

## 2023-04-23 DIAGNOSIS — I5022 Chronic systolic (congestive) heart failure: Secondary | ICD-10-CM

## 2023-04-24 LAB — CUP PACEART REMOTE DEVICE CHECK
Battery Remaining Longevity: 60 mo
Battery Remaining Percentage: 57 %
Battery Voltage: 2.96 V
Brady Statistic RV Percent Paced: 1 %
Date Time Interrogation Session: 20250113032905
HighPow Impedance: 88 Ohm
HighPow Impedance: 88 Ohm
Implantable Lead Connection Status: 753985
Implantable Lead Implant Date: 20200707
Implantable Lead Location: 753860
Implantable Pulse Generator Implant Date: 20200707
Lead Channel Impedance Value: 610 Ohm
Lead Channel Pacing Threshold Amplitude: 0.5 V
Lead Channel Pacing Threshold Pulse Width: 0.5 ms
Lead Channel Sensing Intrinsic Amplitude: 11.7 mV
Lead Channel Setting Pacing Amplitude: 2.5 V
Lead Channel Setting Pacing Pulse Width: 0.5 ms
Lead Channel Setting Sensing Sensitivity: 0.5 mV
Pulse Gen Serial Number: 9816687

## 2023-04-27 ENCOUNTER — Other Ambulatory Visit: Payer: Self-pay | Admitting: Internal Medicine

## 2023-04-30 NOTE — Telephone Encounter (Signed)
Requested medication (s) are due for refill today: yes  Requested medication (s) are on the active medication list: yes  Last refill:  07/26/22 #90 1 RF  Future visit scheduled: yes  Notes to clinic:  sees Endo today. Was not sure if could refill   Requested Prescriptions  Pending Prescriptions Disp Refills   metFORMIN (GLUCOPHAGE) 1000 MG tablet [Pharmacy Med Name: metFORMIN HCl 1000 MG Oral Tablet] 90 tablet 3    Sig: TAKE ONE-HALF TABLET BY MOUTH  TWICE DAILY WITH A MEAL     Endocrinology:  Diabetes - Biguanides Failed - 04/30/2023  8:48 AM      Failed - Cr in normal range and within 360 days    Creat  Date Value Ref Range Status  01/18/2023 1.47 (H) 0.50 - 1.05 mg/dL Final   Creatinine, Urine  Date Value Ref Range Status  01/18/2023 81 20 - 275 mg/dL Final         Failed - HBA1C is between 0 and 7.9 and within 180 days    Hgb A1c MFr Bld  Date Value Ref Range Status  01/18/2023 9.1 (H) <5.7 % of total Hgb Final    Comment:    For someone without known diabetes, a hemoglobin A1c value of 6.5% or greater indicates that they may have  diabetes and this should be confirmed with a follow-up  test. . For someone with known diabetes, a value <7% indicates  that their diabetes is well controlled and a value  greater than or equal to 7% indicates suboptimal  control. A1c targets should be individualized based on  duration of diabetes, age, comorbid conditions, and  other considerations. . Currently, no consensus exists regarding use of hemoglobin A1c for diagnosis of diabetes for children. .          Failed - eGFR in normal range and within 360 days    GFR calc Af Amer  Date Value Ref Range Status  11/03/2019 >60 >60 mL/min Final   GFR, Estimated  Date Value Ref Range Status  02/10/2022 42 (L) >60 mL/min Final    Comment:    (NOTE) Calculated using the CKD-EPI Creatinine Equation (2021)    GFR  Date Value Ref Range Status  05/12/2020 53.53 (L) >60.00 mL/min  Final    Comment:    Calculated using the CKD-EPI Creatinine Equation (2021)   eGFR  Date Value Ref Range Status  01/18/2023 39 (L) > OR = 60 mL/min/1.84m2 Final         Failed - B12 Level in normal range and within 720 days    No results found for: "VITAMINB12"       Failed - CBC within normal limits and completed in the last 12 months    WBC  Date Value Ref Range Status  01/18/2023 8.4 3.8 - 10.8 Thousand/uL Final   RBC  Date Value Ref Range Status  01/18/2023 4.37 3.80 - 5.10 Million/uL Final   Hemoglobin  Date Value Ref Range Status  01/18/2023 12.0 11.7 - 15.5 g/dL Final   HCT  Date Value Ref Range Status  01/18/2023 37.7 35.0 - 45.0 % Final   MCHC  Date Value Ref Range Status  01/18/2023 31.8 (L) 32.0 - 36.0 g/dL Final    Comment:    For adults, a slight decrease in the calculated MCHC value (in the range of 30 to 32 g/dL) is most likely not clinically significant; however, it should be interpreted with caution in correlation with other red  cell parameters and the patient's clinical condition.    [facility]  Date Value Ref Range Status  01/18/2023 27.5 27.0 - 33.0 pg Final   MCV  Date Value Ref Range Status  01/18/2023 86.3 80.0 - 100.0 fL Final   No results found for: "PLTCOUNTKUC", "LABPLAT", "POCPLA" RDW  Date Value Ref Range Status  01/18/2023 14.4 11.0 - 15.0 % Final         Passed - Valid encounter within last 6 months    Recent Outpatient Visits           3 months ago Medicare annual wellness visit, subsequent   [facility] [place], [place], NP   6 months ago Benign essential hypertension   [place] [place], [place] W, NP   9 months ago Type 2 diabetes mellitus with other specified complication, without long-term current use of insulin [facility])   [place] [facility] [place], [place] W, NP   11 months ago Rash and nonspecific skin eruption   [place] [place], Salvadore Oxford, NP   1 year ago Estrogen deficiency   [place] [facility] [place], Salvadore Oxford, NP       Future Appointments             In 2 months Baity, Salvadore Oxford, NP  [facility], [facility]

## 2023-05-15 ENCOUNTER — Other Ambulatory Visit (HOSPITAL_COMMUNITY): Payer: Self-pay | Admitting: Cardiology

## 2023-06-01 ENCOUNTER — Ambulatory Visit: Payer: Medicare Other | Attending: Internal Medicine | Admitting: Internal Medicine

## 2023-06-01 ENCOUNTER — Encounter: Payer: Self-pay | Admitting: Internal Medicine

## 2023-06-01 VITALS — BP 80/50 | HR 80 | Ht 63.0 in | Wt 172.0 lb

## 2023-06-01 DIAGNOSIS — I5022 Chronic systolic (congestive) heart failure: Secondary | ICD-10-CM | POA: Insufficient documentation

## 2023-06-01 DIAGNOSIS — I255 Ischemic cardiomyopathy: Secondary | ICD-10-CM | POA: Diagnosis present

## 2023-06-01 DIAGNOSIS — I2510 Atherosclerotic heart disease of native coronary artery without angina pectoris: Secondary | ICD-10-CM | POA: Insufficient documentation

## 2023-06-01 DIAGNOSIS — Z95810 Presence of automatic (implantable) cardiac defibrillator: Secondary | ICD-10-CM | POA: Insufficient documentation

## 2023-06-01 LAB — CUP PACEART INCLINIC DEVICE CHECK
Date Time Interrogation Session: 20250221165250
Implantable Lead Connection Status: 753985
Implantable Lead Implant Date: 20200707
Implantable Lead Location: 753860
Implantable Pulse Generator Implant Date: 20200707
Pulse Gen Serial Number: 9816687

## 2023-06-01 MED ORDER — CARVEDILOL 12.5 MG PO TABS: 12.5000 mg | ORAL_TABLET | Freq: Two times a day (BID) | ORAL | 3 refills | Status: DC

## 2023-06-01 MED ORDER — SPIRONOLACTONE 25 MG PO TABS: 12.5000 mg | ORAL_TABLET | Freq: Every day | ORAL | 3 refills | Status: DC

## 2023-06-01 NOTE — Progress Notes (Signed)
Patient Care Team: Lorre Munroe, NP as PCP - General (Internal Medicine) Debbe Odea, MD as PCP - Cardiology (Cardiology)   HPI  Monique Franklin is a 68 y.o. female seen in follow-up for an ICD implanted in [facility] 2020 for primary prevention in the setting of ischemic heart disease prior bypass surgery    The patient denies chest pain, nocturnal dyspnea, orthopnea or peripheral edema.  There have been no palpitations, lightheadedness or syncope.  Complains of shortness of breath at about 10 minutes of walking.  Previously cardiac rehab but not now.  Not exercising regularly.Marland Kitchen      DAte Cr K Hgb HgbA1c  10/20 0.98 4.6 12.2   1/23     11.8    11/23 1.35<<1.67 4.2      10/24 1.47 4.5 12.0 9.1      DATE TEST EF    10/20 Echo  30%    11/22 Echo  35-40%     12/23 Echo   35%                  Records and Results Reviewed   Past Medical History:  Diagnosis Date   Cardiomyopathy (HCC)    CHF (congestive heart failure) (HCC)    Class I   Coronary artery disease    Diabetes mellitus without complication (HCC)    GERD (gastroesophageal reflux disease)    HFrEF (heart failure with reduced ejection fraction) (HCC)    Hyperlipidemia    Hypertension    MI (myocardial infarction) (HCC)    PAD (peripheral artery disease) (HCC)     Past Surgical History:  Procedure Laterality Date   BREAST BIOPSY Bilateral 2016   neg   CARDIAC CATHETERIZATION     CARDIAC DEFIBRILLATOR PLACEMENT     CATARACT EXTRACTION Bilateral    CATARACT EXTRACTION W/PHACO Right 11/09/2020   Procedure: CATARACT EXTRACTION PHACO AND INTRAOCULAR LENS PLACEMENT (IOC) RIGHT VIVITY  DIABETIC;  Surgeon: Galen Manila, MD;  Location: [facility];  Service: Ophthalmology;  Laterality: Right;  2.85 00:33.2   CATARACT EXTRACTION W/PHACO Left 11/30/2020   Procedure: CATARACT EXTRACTION PHACO AND INTRAOCULAR LENS PLACEMENT (IOC) LEFT DIABETIC VIVITY 3.86 00:28.3;  Surgeon: Galen Manila, MD;  Location: [facility];  Service: Ophthalmology;  Laterality: Left;   COLONOSCOPY WITH PROPOFOL N/A 10/31/2019   Procedure: COLONOSCOPY WITH PROPOFOL;  Surgeon: Pasty Spillers, MD;  Location: [facility] ENDOSCOPY;  Service: Endoscopy;  Laterality: N/A;  priority 4   COLONOSCOPY WITH PROPOFOL N/A 01/19/2022   Procedure: COLONOSCOPY WITH PROPOFOL;  Surgeon: Wyline Mood, MD;  Location: [facility] END[facility]COPY;  Service: Gastroenterology;  Laterality: N/A;   COMBINED AUGMENTATION MAMMAPLASTY AND ABDOMINOPLASTY     CORONARY ARTERY BYPASS GRAFT  08/2017   X2 with LIMA to LAD; 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;Severe 3 vessel CAD-PCI to LAD and LC x(5stents) Coronary dissection of LM to LAD    CORONARY ARTERY BYPASS GRAFT     CORONARY STENT PLACEMENT     PERCUTANEOUS CORONARY STENT INTERVENTION (PCI-S)     REDUCTION MAMMAPLASTY Bilateral 2017   TONSILLECTOMY      Current Meds  Medication Sig   aspirin EC 81 MG tablet Take 81 mg by mouth daily.   carvedilol (COREG) 25 MG tablet TAKE 1 TABLET BY MOUTH TWICE  DAILY   clobetasol ointment (TEMOVATE) 0.05 % Apply to affected area every night for 4 weeks, then every other day for 4 weeks and then twice a week for 4  weeks or until resolution.   dapagliflozin propanediol (FARXIGA) 10 MG TABS tablet TAKE 1 TABLET BY MOUTH DAILY   icosapent Ethyl (VASCEPA) 1 g capsule Take 2 capsules (2 g total) by mouth 2 (two) times daily. NEEDS FOLLOW UP APPOINTMENT FOR MORE REFILLS   insulin glargine, 2 Unit Dial, (TOUJEO MAX SOLOSTAR) 300 UNIT/ML Solostar Pen Inject 14 Units into the skin daily.   metFORMIN (GLUCOPHAGE) 1000 MG tablet TAKE ONE-HALF TABLET BY MOUTH  TWICE DAILY WITH A MEAL   Multiple Vitamin (MULTIVITAMIN) tablet Take 1 tablet by mouth daily.   oxybutynin (DITROPAN-XL) 5 MG 24 hr tablet TAKE 1 TABLET(5 MG) BY MOUTH AT BEDTIME   rosuvastatin (CRESTOR) 40 MG tablet TAKE 1 TABLET BY MOUTH DAILY   sacubitril-valsartan (ENTRESTO) 49-51 MG Take 1 tablet  by mouth 2 (two) times daily.   TRULICITY 0.75 MG/0.5ML SOAJ Inject 0.75 mg into the skin once a week.    Allergies  Allergen Reactions   Ticagrelor Rash and Itching   Chlorhexidine Itching    Only w/ wipes   Tape Rash      Review of Systems negative except from HPI and PMH  Physical Exam BP (!) 80/50 (BP Location: Left Arm, Patient Position: Sitting, Cuff Size: Normal)   Pulse 80   Ht 5\' 3"  (1.6 m)   Wt 172 lb (78 kg)   SpO2 96%   BMI 30.47 kg/m  Well developed and well nourished in no acute distress HENT normal Neck supple with JVP-flat Clear Device pocket well healed; without hematoma or erythema.  There is no tethering  Regular rate and rhythm, no  gallop No  murmur Abd-soft with active BS No Clubbing cyanosis  edema Skin-warm and dry A & Oriented  Grossly normal sensory and motor function     Device function is normal. Programming changes none  See Paceart for details    ECG sinus at 80 Interval 17/08/38 IMI    CrCl cannot be calculated (Patient's most recent lab result is older than the maximum 21 days allowed.).   Assessment and  Plan ICD-Saint Jude-primary prevention   Ischemic heart disease-prior stenting/CABG   Congestive heart failure-chronic-systolic class II  Hypotension bilateral upper arms  Renal insufficiency gr 3   No symptoms of ischemia.  Continue aspirin.  Rosuvastatin.  Euvolemic.  Continue Farxiga.  We will reintroduce spironolactone.  We will decrease her carvedilol from 25--12.5 to allow her blood pressure sufficient for spironolactone at 12.5 mg.  Will need to check a metabolic profile in about 2 weeks.  She will check her blood pressure at home, if she has any symptoms of orthostatic lightheadedness we will decrease her Entresto from 49/51-24/26 to allow for the introduction of the MRA.   Encouraged exercise and offered her cardiac rehab.  Declined    Current medicines are reviewed at length with the patient today .  The  patient does not  have concerns regarding medicines.

## 2023-06-01 NOTE — Patient Instructions (Signed)
Medication Instructions:  Decrease Carvedilol to 12.5 mg twice daily.  RESTART Spironolactone daily   *If you need a refill on your cardiac medications before your next appointment, please call your pharmacy*   Follow-Up: At [facility], you and your health needs are our priority.  As part of our continuing mission to provide you with exceptional heart care, we have created designated Provider Care Teams.  These Care Teams include your primary Cardiologist (physician) and Advanced Practice Providers (APPs -  Physician Assistants and Nurse Practitioners) who all work together to provide you with the care you need, when you need it.  We recommend signing up for the patient portal called "MyChart".  Sign up information is provided on this After Visit Summary.  MyChart is used to connect with patients for Virtual Visits (Telemedicine).  Patients are able to view lab/test results, encounter notes, upcoming appointments, etc.  Non-urgent messages can be sent to your provider as well.   To learn more about what you can do with MyChart, go to [email or web address].    Your next appointment:   12 month(s)  Provider:   Nobie Putnam, MD

## 2023-06-06 ENCOUNTER — Encounter: Payer: Self-pay | Admitting: Internal Medicine

## 2023-06-06 NOTE — Progress Notes (Signed)
 Remote ICD transmission.

## 2023-06-06 NOTE — Addendum Note (Signed)
 Addended by: Geralyn Flash D on: 06/06/2023 12:03 PM   Modules accepted: Orders

## 2023-06-17 ENCOUNTER — Other Ambulatory Visit (HOSPITAL_COMMUNITY): Payer: Self-pay | Admitting: Cardiology

## 2023-06-22 ENCOUNTER — Other Ambulatory Visit: Payer: Self-pay | Admitting: Internal Medicine

## 2023-06-22 DIAGNOSIS — R350 Frequency of micturition: Secondary | ICD-10-CM

## 2023-06-22 NOTE — Telephone Encounter (Signed)
 Requested Prescriptions  Pending Prescriptions Disp Refills   oxybutynin (DITROPAN-XL) 5 MG 24 hr tablet [Pharmacy Med Name: Oxybutynin Chloride ER 5 MG Oral Tablet Extended Release 24 Hour] 90 tablet 0    Sig: TAKE 1 TABLET BY MOUTH AT  BEDTIME     Urology:  Bladder Agents Passed - 06/22/2023  2:59 PM      Passed - Valid encounter within last 12 months    Recent Outpatient Visits           5 months ago Medicare annual wellness visit, subsequent   [place] [facility] [place], Salvadore Oxford, NP   8 months ago Benign essential hypertension   [place] [facility] [place], [place] W, NP   11 months ago Type 2 diabetes mellitus with other specified complication, without long-term current use of insulin [facility])   [place] [facility] [place], Salvadore Oxford, NP   1 year ago Rash and nonspecific skin eruption   [place] [facility] [place], Salvadore Oxford, NP   1 year ago Estrogen deficiency   [place] [facility] [place], Salvadore Oxford, NP       Future Appointments             In 3 weeks Sampson Si, Salvadore Oxford, NP [place] [facility], [facility]

## 2023-06-29 ENCOUNTER — Other Ambulatory Visit: Payer: Self-pay | Admitting: Internal Medicine

## 2023-07-02 NOTE — Telephone Encounter (Signed)
 Requested Prescriptions  Pending Prescriptions Disp Refills   insulin glargine, 2 Unit Dial, (TOUJEO MAX SOLOSTAR) 300 UNIT/ML Solostar Pen [Pharmacy Med Name: Myrlene Broker SoloStar 300 UNIT/ML Subcutaneous Solution Pen-injector] 6 mL 0    Sig: INJECT 14 UNITS INTO THE SKIN  DAILY     Endocrinology:  Diabetes - Insulins Failed - 07/02/2023  1:23 PM      Failed - HBA1C is between 0 and 7.9 and within 180 days    Hgb A1c MFr Bld  Date Value Ref Range Status  01/18/2023 9.1 (H) <5.7 % of total Hgb Final    Comment:    For someone without known diabetes, a hemoglobin A1c value of 6.5% or greater indicates that they may have  diabetes and this should be confirmed with a follow-up  test. . For someone with known diabetes, a value <7% indicates  that their diabetes is well controlled and a value  greater than or equal to 7% indicates suboptimal  control. A1c targets should be individualized based on  duration of diabetes, age, comorbid conditions, and  other considerations. . Currently, no consensus exists regarding use of hemoglobin A1c for diagnosis of diabetes for children. Verna Czech - Valid encounter within last 6 months    Recent Outpatient Visits           5 months ago Medicare annual wellness visit, subsequent   [place] [facility] [place], [place], NP   8 months ago Benign essential hypertension   [place] [facility] [place], Salvadore Oxford, NP   11 months ago Type 2 diabetes mellitus with other specified complication, without long-term current use of insulin (HCC)   [place] [facility] [place], [place], NP   1 year ago Rash and nonspecific skin eruption   [place] [place], [place] W, NP   1 year ago Estrogen deficiency   [facility] [place], Salvadore Oxford, NP       Future Appointments             In 2 weeks Sampson Si, Salvadore Oxford, NP [place] [facility]             metFORMIN (GLUCOPHAGE) 1000 MG tablet [Pharmacy Med Name: metFORMIN HCl 1000 MG Oral Tablet] 90 tablet 0    Sig: TAKE ONE-HALF TABLET BY MOUTH  TWICE DAILY WITH A MEAL     Endocrinology:  Diabetes - Biguanides Failed - 07/02/2023  1:23 PM      Failed - Cr in normal range and within 360 days    Creat  Date Value Ref Range Status  01/18/2023 1.47 (H) 0.50 - 1.05 mg/dL Final   Creatinine, Urine  Date Value Ref Range Status  01/18/2023 81 20 - 275 mg/dL Final         Failed - HBA1C is between 0 and 7.9 and within 180 days    Hgb A1c MFr Bld  Date Value Ref Range Status  01/18/2023 9.1 (H) <5.7 % of total Hgb Final    Comment:    For someone without known diabetes, a hemoglobin A1c value of 6.5% or greater indicates that they may have  diabetes and this should be confirmed with a follow-up  test. . For someone with known diabetes, a value <7% indicates  that their diabetes is well controlled and a value  greater than  or equal to 7% indicates suboptimal  control. A1c targets should be individualized based on  duration of diabetes, age, comorbid conditions, and  other considerations. . Currently, no consensus exists regarding use of hemoglobin A1c for diagnosis of diabetes for children. .          Failed - eGFR in normal range and within 360 days    GFR calc Af Amer  Date Value Ref Range Status  11/03/2019 >60 >60 mL/min Final   GFR, Estimated  Date Value Ref Range Status  02/10/2022 42 (L) >60 mL/min Final    Comment:    (NOTE) Calculated using the CKD-EPI Creatinine Equation (2021)    GFR  Date Value Ref Range Status  05/12/2020 53.53 (L) >60.00 mL/min Final    Comment:    Calculated using the CKD-EPI Creatinine Equation (2021)   eGFR  Date Value Ref Range Status  01/18/2023 39 (L) > OR = 60 mL/min/1.71m2 Final         Failed - B12 Level in normal range and within 720 days    No results found for: "VITAMINB12"       Failed -  CBC within normal limits and completed in the last 12 months    WBC  Date Value Ref Range Status  01/18/2023 8.4 3.8 - 10.8 Thousand/uL Final   RBC  Date Value Ref Range Status  01/18/2023 4.37 3.80 - 5.10 Million/uL Final   Hemoglobin  Date Value Ref Range Status  01/18/2023 12.0 11.7 - 15.5 g/dL Final   HCT  Date Value Ref Range Status  01/18/2023 37.7 35.0 - 45.0 % Final   MCHC  Date Value Ref Range Status  01/18/2023 31.8 (L) 32.0 - 36.0 g/dL Final    Comment:    For adults, a slight decrease in the calculated MCHC value (in the range of 30 to 32 g/dL) is most likely not clinically significant; however, it should be interpreted with caution in correlation with other red cell parameters and the patient's clinical condition.    [facility]  Date Value Ref Range Status  01/18/2023 27.5 27.0 - 33.0 pg Final   MCV  Date Value Ref Range Status  01/18/2023 86.3 80.0 - 100.0 fL Final   No results found for: "PLTCOUNTKUC", "LABPLAT", "POCPLA" RDW  Date Value Ref Range Status  01/18/2023 14.4 11.0 - 15.0 % Final         Passed - Valid encounter within last 6 months    Recent Outpatient Visits           5 months ago Medicare annual wellness visit, subsequent   [place] [facility] [place], [place], NP   8 months ago Benign essential hypertension   [place] [facility] [place], [place], NP   11 months ago Type 2 diabetes mellitus with other specified complication, without long-term current use of insulin [facility])   [place] [facility] [place], Salvadore Oxford, NP   1 year ago Rash and nonspecific skin eruption   [place] [facility] [place], Salvadore Oxford, NP   1 year ago Estrogen deficiency    [facility] [place], Salvadore Oxford, NP       Future Appointments             In 2 weeks Sampson Si, Salvadore Oxford, NP  [facility]

## 2023-07-06 ENCOUNTER — Other Ambulatory Visit: Payer: Self-pay

## 2023-07-06 MED ORDER — CARVEDILOL 12.5 MG PO TABS: 12.5000 mg | ORAL_TABLET | Freq: Two times a day (BID) | ORAL | 3 refills | Status: DC

## 2023-07-06 MED ORDER — SPIRONOLACTONE 25 MG PO TABS: 12.5000 mg | ORAL_TABLET | Freq: Every day | ORAL | 3 refills | Status: AC

## 2023-07-17 ENCOUNTER — Other Ambulatory Visit (HOSPITAL_COMMUNITY): Payer: Self-pay | Admitting: Cardiology

## 2023-07-19 ENCOUNTER — Encounter: Payer: Self-pay | Admitting: Internal Medicine

## 2023-07-19 ENCOUNTER — Ambulatory Visit: Payer: Medicare Other

## 2023-07-19 ENCOUNTER — Ambulatory Visit (INDEPENDENT_AMBULATORY_CARE_PROVIDER_SITE_OTHER): Payer: Self-pay | Admitting: Internal Medicine

## 2023-07-19 VITALS — BP 82/52 | Ht 63.0 in | Wt 164.0 lb

## 2023-07-19 VITALS — Ht 63.0 in | Wt 164.0 lb

## 2023-07-19 DIAGNOSIS — Z7985 Long-term (current) use of injectable non-insulin antidiabetic drugs: Secondary | ICD-10-CM

## 2023-07-19 DIAGNOSIS — I10 Essential (primary) hypertension: Secondary | ICD-10-CM

## 2023-07-19 DIAGNOSIS — I509 Heart failure, unspecified: Secondary | ICD-10-CM | POA: Diagnosis not present

## 2023-07-19 DIAGNOSIS — Z794 Long term (current) use of insulin: Secondary | ICD-10-CM

## 2023-07-19 DIAGNOSIS — N1832 Chronic kidney disease, stage 3b: Secondary | ICD-10-CM

## 2023-07-19 DIAGNOSIS — Z7984 Long term (current) use of oral hypoglycemic drugs: Secondary | ICD-10-CM

## 2023-07-19 DIAGNOSIS — K219 Gastro-esophageal reflux disease without esophagitis: Secondary | ICD-10-CM

## 2023-07-19 DIAGNOSIS — E663 Overweight: Secondary | ICD-10-CM

## 2023-07-19 DIAGNOSIS — N3281 Overactive bladder: Secondary | ICD-10-CM

## 2023-07-19 DIAGNOSIS — Z0000 Encounter for general adult medical examination without abnormal findings: Secondary | ICD-10-CM

## 2023-07-19 DIAGNOSIS — E1169 Type 2 diabetes mellitus with other specified complication: Secondary | ICD-10-CM

## 2023-07-19 DIAGNOSIS — I2510 Atherosclerotic heart disease of native coronary artery without angina pectoris: Secondary | ICD-10-CM | POA: Diagnosis not present

## 2023-07-19 DIAGNOSIS — E785 Hyperlipidemia, unspecified: Secondary | ICD-10-CM

## 2023-07-19 DIAGNOSIS — I739 Peripheral vascular disease, unspecified: Secondary | ICD-10-CM

## 2023-07-19 DIAGNOSIS — E119 Type 2 diabetes mellitus without complications: Secondary | ICD-10-CM

## 2023-07-19 DIAGNOSIS — I428 Other cardiomyopathies: Secondary | ICD-10-CM

## 2023-07-19 DIAGNOSIS — I252 Old myocardial infarction: Secondary | ICD-10-CM

## 2023-07-19 NOTE — Assessment & Plan Note (Signed)
 No angina Continue carvedilol, rosuvastatin, vascepa and aspirin Encourage low-fat diet

## 2023-07-19 NOTE — Assessment & Plan Note (Signed)
 BP on the low end although she is not symptomatic Advised her to call cardiology to see about adjusting her carvedilol, spironolactone or Entresto C-Met today

## 2023-07-19 NOTE — Assessment & Plan Note (Signed)
 No angina C-Met and lipid profile today Continue carvedilol, rosuvastatin, vascepa and aspirin Encourage low-fat diet

## 2023-07-19 NOTE — Assessment & Plan Note (Signed)
 C-Met and lipid profile today Continue rosuvastatin and vascepa Encouraged low-fat fat diet

## 2023-07-19 NOTE — Assessment & Plan Note (Signed)
C-Met today Continue valsartan for renal protection

## 2023-07-19 NOTE — Assessment & Plan Note (Signed)
 Controlled on carvedilol and entresto but given low BP, will have her reach out to cardiology to discuss decreasing 1 of these medications Reinforced DASH diet

## 2023-07-19 NOTE — Patient Instructions (Addendum)
 Monique Franklin , Thank you for taking time to come for your Medicare Wellness Visit. I appreciate your ongoing commitment to your health goals. Please review the following plan we discussed and let me know if I can assist you in the future.   Referrals/Orders/Follow-Ups/Clinician Recommendations:   This is a list of the screening recommended for you and due dates:  Health Maintenance  Topic Date Due   Hepatitis C Screening  Never done   DTaP/Tdap/Td vaccine (1 - Tdap) Never done   Hemoglobin A1C  07/19/2023   Eye exam for diabetics  08/02/2023   Flu Shot  11/09/2023   Yearly kidney function blood test for diabetes  01/18/2024   Yearly kidney health urinalysis for diabetes  01/18/2024   Complete foot exam   07/18/2024   Medicare Annual Wellness Visit  07/18/2024   Mammogram  08/02/2024   Colon Cancer Screening  01/20/2027   Pneumonia Vaccine  Completed   DEXA scan (bone density measurement)  Completed   Zoster (Shingles) Vaccine  Completed   HPV Vaccine  Aged Out   Meningitis B Vaccine  Aged Out   COVID-19 Vaccine  Discontinued    Advanced directives: (Declined) Advance directive discussed with you today. Even though you declined this today, please call our office should you change your mind, and we can give you the proper paperwork for you to fill out.  Next Medicare Annual Wellness Visit scheduled for next year: Yes

## 2023-07-19 NOTE — Assessment & Plan Note (Signed)
 Encouraged diet and exercise for weight loss ?

## 2023-07-19 NOTE — Progress Notes (Signed)
 Subjective:   Monique Franklin is a 68 y.o. who presents for a Medicare Wellness preventive visit.  Visit Complete: Virtual I connected with  Monique Franklin on 07/19/23 by a audio enabled telemedicine application and verified that I am speaking with the correct person using two identifiers.  Patient Location: Home  Provider Location: Home Office  I discussed the limitations of evaluation and management by telemedicine. The patient expressed understanding and agreed to proceed.  Vital Signs: Because this visit was a virtual/telehealth visit, some criteria may be missing or patient reported. Any vitals not documented were not able to be obtained and vitals that have been documented are patient reported.    Persons Participating in Visit: Patient.  AWV Questionnaire: No: Patient Medicare AWV questionnaire was not completed prior to this visit.  Cardiac Risk Factors include: advanced age (>47men, >65 women);diabetes mellitus     Objective:    Today's Vitals   07/19/23 1430  Weight: 164 lb (74.4 kg)  Height: 5\' 3"  (1.6 m)   Body mass index is 29.05 kg/m.     07/19/2023    2:36 PM 07/13/2022    3:01 PM 01/19/2022    7:37 AM 11/30/2020    7:37 AM 11/09/2020    9:02 AM 10/31/2019   11:14 AM  Advanced Directives  Does Patient Have a Medical Advance Directive? No No Yes Yes Yes Yes  Type of Advance Directive   Living will;Healthcare Power of [employer] Power of [place];Living will Healthcare Power of [place];Living will   Does patient want to make changes to medical advance directive?    No - Patient declined No - Patient declined   Copy of Healthcare Power of Attorney in Chart?   No - copy requested No - copy requested No - copy requested   Would patient like information on creating a medical advance directive? No - Patient declined No - Patient declined No - Patient declined       Current Medications (verified) Outpatient Encounter Medications as of 07/19/2023   Medication Sig   aspirin EC 81 MG tablet Take 81 mg by mouth daily.   carvedilol (COREG) 12.5 MG tablet Take 1 tablet (12.5 mg total) by mouth 2 (two) times daily.   clobetasol ointment (TEMOVATE) 0.05 % Apply to affected area every night for 4 weeks, then every other day for 4 weeks and then twice a week for 4 weeks or until resolution.   dapagliflozin propanediol (FARXIGA) 10 MG TABS tablet Take 1 tablet (10 mg total) by mouth daily. NEEDS FOLLOW UP APPOINTMENT FOR ANYMORE REFILLS   icosapent Ethyl (VASCEPA) 1 g capsule TAKE 2 CAPSULES BY MOUTH TWICE  DAILY (NEEDS FOLLOW UP  APPOINTMENT FOR MORE REFILLS)   insulin glargine, 2 Unit Dial, (TOUJEO MAX SOLOSTAR) 300 UNIT/ML Solostar Pen INJECT 14 UNITS INTO THE SKIN  DAILY   metFORMIN (GLUCOPHAGE) 1000 MG tablet TAKE ONE-HALF TABLET BY MOUTH  TWICE DAILY WITH A MEAL   Multiple Vitamin (MULTIVITAMIN) tablet Take 1 tablet by mouth daily.   oxybutynin (DITROPAN-XL) 5 MG 24 hr tablet TAKE 1 TABLET BY MOUTH AT  BEDTIME   rosuvastatin (CRESTOR) 40 MG tablet TAKE 1 TABLET BY MOUTH DAILY   sacubitril-valsartan (ENTRESTO) 49-51 MG Take 1 tablet by mouth 2 (two) times daily.   spironolactone (ALDACTONE) 25 MG tablet Take 0.5 tablets (12.5 mg total) by mouth daily.   TRULICITY 0.75 MG/0.5ML SOAJ Inject 0.75 mg into the skin once a week.   [DISCONTINUED] Insulin Pen Needle 31G  X 5 MM MISC BD Pen Needles- brand specific Inject insulin via insulin pen 6 x daily (Patient not taking: Reported on 07/19/2023)   No facility-administered encounter medications on file as of 07/19/2023.    Allergies (verified) Ticagrelor, Chlorhexidine, and Tape   History: Past Medical History:  Diagnosis Date   Cardiomyopathy (HCC)    CHF (congestive heart failure) (HCC)    Class I   Coronary artery disease    Diabetes mellitus without complication (HCC)    GERD (gastroesophageal reflux disease)    HFrEF (heart failure with reduced ejection fraction) (HCC)    Hyperlipidemia     Hypertension    MI (myocardial infarction) (HCC)    PAD (peripheral artery disease) (HCC)    Past Surgical History:  Procedure Laterality Date   BREAST BIOPSY Bilateral 2016   neg   CARDIAC CATHETERIZATION     CARDIAC DEFIBRILLATOR PLACEMENT     CATARACT EXTRACTION Bilateral    CATARACT EXTRACTION W/PHACO Right 11/09/2020   Procedure: CATARACT EXTRACTION PHACO AND INTRAOCULAR LENS PLACEMENT (IOC) RIGHT VIVITY  DIABETIC;  Surgeon: Galen Manila, MD;  Location: [facility];  Service: Ophthalmology;  Laterality: Right;  2.85 00:33.2   CATARACT EXTRACTION W/PHACO Left 11/30/2020   Procedure: CATARACT EXTRACTION PHACO AND INTRAOCULAR LENS PLACEMENT (IOC) LEFT DIABETIC VIVITY 3.86 00:28.3;  Surgeon: Galen Manila, MD;  Location: [facility];  Service: Ophthalmology;  Laterality: Left;   COLONOSCOPY WITH PROPOFOL N/A 10/31/2019   Procedure: COLONOSCOPY WITH PROPOFOL;  Surgeon: Pasty Spillers, MD;  Location: [facility];  Service: Endoscopy;  Laterality: N/A;  priority 4   COLONOSCOPY WITH PROPOFOL N/A 01/19/2022   Procedure: COLONOSCOPY WITH PROPOFOL;  Surgeon: Wyline Mood, MD;  Location: [facility];  Service: Gastroenterology;  Laterality: N/A;   COMBINED AUGMENTATION MAMMAPLASTY AND ABDOMINOPLASTY     CORONARY ARTERY BYPASS GRAFT  08/2017   X2 with LIMA to LAD; 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;Severe 3 vessel CAD-PCI to LAD and LC x(5stents) Coronary dissection of LM to LAD    CORONARY ARTERY BYPASS GRAFT     CORONARY STENT PLACEMENT     PERCUTANEOUS CORONARY STENT INTERVENTION (PCI-S)     REDUCTION MAMMAPLASTY Bilateral 2017   TONSILLECTOMY     Family History  Problem Relation Age of Onset   Heart attack Mother    COPD Father    Thyroid cancer Sister    Breast cancer Sister 75   Diabetes Sister    Diabetes Brother    Social History   Socioeconomic History   Marital status: Widowed    Spouse name: Not on file   Number of children: Not on file    Years of education: Not on file   Highest education level: Some college, no degree  Occupational History   Not on file  Tobacco Use   Smoking status: Former    Current packs/day: 0.00    Average packs/day: 1 pack/day for 33.0 years (33.0 ttl pk-yrs)    Types: Cigarettes    Start date: 55    Quit date: 2005    Years since quitting: 20.2   Smokeless tobacco: Never  Vaping Use   Vaping status: Never Used  Substance and Sexual Activity   Alcohol use: Yes    Comment: occasional   Drug use: Never   Sexual activity: Not Currently    Birth control/protection: Post-menopausal  Other Topics Concern   Not on file  Social History Narrative   Not on file   Social Drivers of Health  Financial Resource Strain: Low Risk  (07/19/2023)   Overall Financial Resource Strain (CARDIA)    Difficulty of Paying Living Expenses: Not hard at all  Food Insecurity: No Food Insecurity (07/19/2023)   Hunger Vital Sign    Worried About Running Out of Food in the Last Year: Never true    Ran Out of Food in the Last Year: Never true  Transportation Needs: No Transportation Needs (07/19/2023)   PRAPARE - Administrator, Civil Service (Medical): No    Lack of Transportation (Non-Medical): No  Physical Activity: Insufficiently Active (07/19/2023)   Exercise Vital Sign    Days of Exercise per Week: 3 days    Minutes of Exercise per Session: 30 min  Stress: No Stress Concern Present (07/19/2023)   [employer] of Occupational Health - Occupational Stress Questionnaire    Feeling of Stress : Not at all  Social Connections: Moderately Isolated (07/19/2023)   Social Connection and Isolation Panel [NHANES]    Frequency of Communication with Friends and Family: More than three times a week    Frequency of Social Gatherings with Friends and Family: More than three times a week    Attends Religious Services: Never    Database administrator or Organizations: No    Attends Hospital doctor: More than 4 times per year    Marital Status: Widowed    Tobacco Counseling Counseling given: Not Answered    Clinical Intake:  Pre-visit preparation completed: Yes  Pain : No/denies pain     BMI - recorded: 29.05 Nutritional Status: BMI 25 -29 Overweight Nutritional Risks: None Diabetes: Yes CBG done?: Yes (CBG 130 per patient) CBG resulted in Enter/ Edit results?: Yes Did pt. bring in CBG monitor from home?: No  Lab Results  Component Value Date   HGBA1C 9.1 (H) 01/18/2023   HGBA1C 9.1 (A) 10/18/2022   HGBA1C 10.0 (A) 07/18/2022     How often do you need to have someone help you when you read instructions, pamphlets, or other written materials from your doctor or pharmacy?: 1 - Never  Interpreter Needed?: No  Information entered by :: Theresa Mulligan LPN   Activities of Daily Living     07/19/2023    2:35 PM 01/18/2023    9:02 AM  In your present state of health, do you have any difficulty performing the following activities:  Hearing? 0 0  Vision? 0 0  Difficulty concentrating or making decisions? 0 0  Walking or climbing stairs? 0 0  Dressing or bathing? 0 0  Doing errands, shopping? 0 0  Preparing Food and eating ? N   Using the Toilet? N   In the past six months, have you accidently leaked urine? N   Do you have problems with loss of bowel control? N   Managing your Medications? N   Managing your Finances? N   Housekeeping or managing your Housekeeping? N     Patient Care Team: Lorre Munroe, NP as PCP - General (Internal Medicine) Debbe Odea, MD as PCP - Cardiology (Cardiology)  Indicate any recent Medical Services you may have received from other than [facility] providers in the past year (date may be approximate).     Assessment:   This is a routine wellness examination for Monique Franklin.  Hearing/Vision screen Hearing Screening - Comments:: Denies hearing difficulties   Vision Screening - Comments:: Wears rx glasses - up to date  with routine eye exams with  [facility]  Goals Addressed               This Visit's Progress     Increase physical activity (pt-stated)        Remain active.       Depression Screen      07/19/2023    8:16 AM 01/18/2023    9:02 AM 10/18/2022    9:23 AM 07/18/2022    9:37 AM 07/13/2022    3:00 PM 05/31/2022   10:29 AM 03/27/2022    9:09 AM  PHQ 2/9 Scores  PHQ - 2 Score 0 0 0 0 0 0 0  PHQ- 9 Score 0   0 0      Fall Risk      07/19/2023    2:35 PM 07/19/2023    8:16 AM 01/18/2023    9:02 AM 10/18/2022    9:24 AM 07/18/2022    9:37 AM  Fall Risk   Falls in the past year? 0 0 0 0 0  Number falls in past yr: 0      Injury with Fall? 0  0 0 0  Risk for fall due to : No Fall Risks  No Fall Risks No Fall Risks No Fall Risks  Follow up Falls prevention discussed;Falls evaluation completed        MEDICARE RISK AT HOME:   Medicare Risk at Home Any stairs in or around the home?: Yes If so, are there any without handrails?: No Home free of loose throw rugs in walkways, pet beds, electrical cords, etc?: Yes Adequate lighting in your home to reduce risk of falls?: Yes Life alert?: No Use of a cane, walker or w/c?: No Grab bars in the bathroom?: Yes Shower chair or bench in shower?: Yes Elevated toilet seat or a handicapped toilet?: No  TIMED UP AND GO:  Was the test performed?  No  Cognitive Function: 6CIT completed        07/19/2023    2:36 PM 07/13/2022    3:06 PM  6CIT Screen  What Year? 0 points 0 points  What month? 0 points 0 points  What time? 0 points 0 points  Count back from 20 0 points 0 points  Months in reverse 0 points 0 points  Repeat phrase 0 points 0 points  Total Score 0 points 0 points    Immunizations Immunization History  Administered Date(s) Administered   Fluad Quad(high Dose 65+) 01/18/2021, 12/26/2021   Fluad Trivalent(High Dose 65+) 01/18/2023   Influenza,inj,Quad PF,6+ Mos 01/03/2019, 05/12/2020   Influenza,inj,quad, With  Preservative 01/08/2018   PFIZER(Purple Top)SARS-COV-2 Vaccination 05/30/2019, 06/20/2019, 01/09/2020   PNEUMOCOCCAL CONJUGATE-20 10/13/2020   Zoster Recombinant(Shingrix) 06/12/2018, 08/14/2018    Screening Tests Health Maintenance  Topic Date Due   Hepatitis C Screening  Never done   DTaP/Tdap/Td (1 - Tdap) Never done   HEMOGLOBIN A1C  07/19/2023   OPHTHALMOLOGY EXAM  08/02/2023   INFLUENZA VACCINE  11/09/2023   Diabetic kidney evaluation - eGFR measurement  01/18/2024   Diabetic kidney evaluation - Urine ACR  01/18/2024   FOOT EXAM  07/18/2024   Medicare Annual Wellness (AWV)  07/18/2024   MAMMOGRAM  08/02/2024   Colonoscopy  01/20/2027   Pneumonia Vaccine 42+ Years old  Completed   DEXA SCAN  Completed   Zoster Vaccines- Shingrix  Completed   HPV VACCINES  Aged Out   Meningococcal B Vaccine  Aged Out   COVID-19 Vaccine  Discontinued    Health Maintenance  Health Maintenance Due  Topic Date Due   Hepatitis C Screening  Never done   DTaP/Tdap/Td (1 - Tdap) Never done   HEMOGLOBIN A1C  07/19/2023   Health Maintenance Items Addressed: Labs Hep-C and Hemoglobin A1C completed 07/19/23  Additional Screening:  Vision Screening: Recommended annual ophthalmology exams for early detection of glaucoma and other disorders of the eye.  Dental Screening: Recommended annual dental exams for proper oral hygiene  Community Resource Referral / Chronic Care Management: CRR required this visit?  No   CCM required this visit?  No     Plan:     I have personally reviewed and noted the following in the patient's chart:   Medical and social history Use of alcohol, tobacco or illicit drugs  Current medications and supplements including opioid prescriptions. Patient is not currently taking opioid prescriptions. Functional ability and status Nutritional status Physical activity Advanced directives List of other physicians Hospitalizations, surgeries, and ER visits in previous  12 months Vitals Screenings to include cognitive, depression, and falls Referrals and appointments  In addition, I have reviewed and discussed with patient certain preventive protocols, quality metrics, and best practice recommendations. A written personalized care plan for preventive services as well as general preventive health recommendations were provided to patient.     Tillie Rung, LPN   1/61/0960   After Visit Summary: (MyChart) Due to this being a telephonic visit, the after visit summary with patients personalized plan was offered to patient via MyChart   Notes: Nothing significant to report at this time.

## 2023-07-19 NOTE — Assessment & Plan Note (Signed)
 Compensated Continue carvedilol, Entresto and spironolactone per cardiology although given low blood pressure, encouraged her to reach out to cardiology to adjust 1 of these medications Encourage low-salt diet Monitor daily weights

## 2023-07-19 NOTE — Patient Instructions (Signed)

## 2023-07-19 NOTE — Assessment & Plan Note (Signed)
 A1c today Urine microalbumin has been checked within the last year Encourage low-carb diet and exercise for weight loss Continue toujeo, metformin, trulicity and farxiga Encourage routine eye exam Encouraged routine foot exam Immunizations UTD

## 2023-07-19 NOTE — Progress Notes (Signed)
 Subjective:    Patient ID: Monique Franklin, female    DOB: 02-12-1956, 68 y.o.   MRN: [id number]  HPI  Patient presents to clinic today for 103-month follow-up of chronic conditions.  HLD with CAD status post MI with PAD: Her last LDL was 56, triglycerides 409, 05/2022.  She denies myalgias on rosuvastatin and vascepa.  She is taking aspirin as well.  She tries to consume a low-fat diet.  DM 2: Her last A1c 9.1 %, 01/2023.  She is taking toujeo, metformin, trulicity and farxiga as prescribed.  Her sugars range 90-250  She checks her feet routinely.  Her last eye exam was 07/2022.  Flu 01/2023.  Prevnar 20 10/2020.  COVID Pfizer x 3.  She follows with endocrinology.  CKD 3: Her last creatinine was 1.3, GFR 45, 01/2023.  She is on valsartan for renal protection.  She does not follow with nephrology.  HTN with cardiomyopathy: Her BP today is 82/52.  She is taking carvedilol, spironolactone and entresto as prescribed.  ECG from 05/2023 reviewed.  CHF: She denies current cough, shortness of breath or lower extremity edema.  She is taking carvedilol, spironolactone and entresto as prescribed.  Echo from 03/2022 reviewed.  GERD: Triggered by tomato based foods and laying down after eating.  She takes tums as needed with good relief of symptoms.  There is no upper GI on file.  OAB: She reports mainly urinary urgency.  She takes oxybutynin as prescribed.  She does not follow with urology.  Review of Systems  Past Medical History:  Diagnosis Date   Cardiomyopathy (HCC)    CHF (congestive heart failure) (HCC)    Class I   Coronary artery disease    Diabetes mellitus without complication (HCC)    GERD (gastroesophageal reflux disease)    HFrEF (heart failure with reduced ejection fraction) (HCC)    Hyperlipidemia    Hypertension    MI (myocardial infarction) (HCC)    PAD (peripheral artery disease) (HCC)     Current Outpatient Medications  Medication Sig Dispense Refill   aspirin EC 81 MG  tablet Take 81 mg by mouth daily.     carvedilol (COREG) 12.5 MG tablet Take 1 tablet (12.5 mg total) by mouth 2 (two) times daily. 180 tablet 3   clobetasol ointment (TEMOVATE) 0.05 % Apply to affected area every night for 4 weeks, then every other day for 4 weeks and then twice a week for 4 weeks or until resolution. 30 g 5   dapagliflozin propanediol (FARXIGA) 10 MG TABS tablet Take 1 tablet (10 mg total) by mouth daily. NEEDS FOLLOW UP APPOINTMENT FOR ANYMORE REFILLS 90 tablet 0   icosapent Ethyl (VASCEPA) 1 g capsule TAKE 2 CAPSULES BY MOUTH TWICE  DAILY (NEEDS FOLLOW UP  APPOINTMENT FOR MORE REFILLS) 60 capsule 0   insulin glargine, 2 Unit Dial, (TOUJEO MAX SOLOSTAR) 300 UNIT/ML Solostar Pen INJECT 14 UNITS INTO THE SKIN  DAILY 6 mL 0   Insulin Pen Needle 31G X 5 MM MISC BD Pen Needles- brand specific Inject insulin via insulin pen 6 x daily (Patient not taking: Reported on 06/01/2023) 200 each 3   metFORMIN (GLUCOPHAGE) 1000 MG tablet TAKE ONE-HALF TABLET BY MOUTH  TWICE DAILY WITH A MEAL 90 tablet 0   Multiple Vitamin (MULTIVITAMIN) tablet Take 1 tablet by mouth daily.     oxybutynin (DITROPAN-XL) 5 MG 24 hr tablet TAKE 1 TABLET BY MOUTH AT  BEDTIME 90 tablet 0   rosuvastatin (CRESTOR) 40  MG tablet TAKE 1 TABLET BY MOUTH DAILY 90 tablet 3   sacubitril-valsartan (ENTRESTO) 49-51 MG Take 1 tablet by mouth 2 (two) times daily. 180 tablet 3   spironolactone (ALDACTONE) 25 MG tablet Take 0.5 tablets (12.5 mg total) by mouth daily. 45 tablet 3   TRULICITY 0.75 MG/0.5ML SOAJ Inject 0.75 mg into the skin once a week.     No current facility-administered medications for this visit.    Allergies  Allergen Reactions   Ticagrelor Rash and Itching   Chlorhexidine Itching    Only w/ wipes   Tape Rash    Family History  Problem Relation Age of Onset   Heart attack Mother    COPD Father    Thyroid cancer Sister    Breast cancer Sister 21   Diabetes Sister    Diabetes Brother     Social  History   Socioeconomic History   Marital status: Widowed    Spouse name: Not on file   Number of children: Not on file   Years of education: Not on file   Highest education level: Some college, no degree  Occupational History   Not on file  Tobacco Use   Smoking status: Former    Current packs/day: 0.00    Average packs/day: 1 pack/day for 33.0 years (33.0 ttl pk-yrs)    Types: Cigarettes    Start date: 42    Quit date: 2005    Years since quitting: 20.2   Smokeless tobacco: Never  Vaping Use   Vaping status: Never Used  Substance and Sexual Activity   Alcohol use: Yes    Comment: occasional   Drug use: Never   Sexual activity: Not Currently    Birth control/protection: Post-menopausal  Other Topics Concern   Not on file  Social History Narrative   Not on file   Social Drivers of Health   Financial Resource Strain: Low Risk  (07/15/2023)   Overall Financial Resource Strain (CARDIA)    Difficulty of Paying Living Expenses: Not hard at all  Food Insecurity: No Food Insecurity (07/15/2023)   Hunger Vital Sign    Worried About Running Out of Food in the Last Year: Never true    Ran Out of Food in the Last Year: Never true  Transportation Needs: No Transportation Needs (07/15/2023)   PRAPARE - Administrator, Civil Service (Medical): No    Lack of Transportation (Non-Medical): No  Physical Activity: Insufficiently Active (07/15/2023)   Exercise Vital Sign    Days of Exercise per Week: 3 days    Minutes of Exercise per Session: 20 min  Stress: No Stress Concern Present (07/15/2023)   [employer] of Occupational Health - Occupational Stress Questionnaire    Feeling of Stress : Not at all  Social Connections: Socially Isolated (07/15/2023)   Social Connection and Isolation Panel [NHANES]    Frequency of Communication with Friends and Family: More than three times a week    Frequency of Social Gatherings with Friends and Family: Twice a week    Attends Religious  Services: Never    Database administrator or Organizations: No    Attends Engineer, structural: Not on file    Marital Status: Widowed  Intimate Partner Violence: Not At Risk (07/13/2022)   Humiliation, Afraid, Rape, and Kick questionnaire    Fear of Current or Ex-Partner: No    Emotionally Abused: No    Physically Abused: No    Sexually Abused: No  Constitutional: Denies fever, malaise, fatigue, headache or abrupt weight changes.  HEENT: Denies eye pain, eye redness, ear pain, ringing in the ears, wax buildup, runny nose, nasal congestion, bloody nose, or sore throat. Respiratory: Denies difficulty breathing, shortness of breath, cough or sputum production.   Cardiovascular: Denies chest pain, chest tightness, palpitations or swelling in the hands or feet.  Gastrointestinal: Denies abdominal pain, bloating, constipation, diarrhea or blood in the stool.  GU: Patient reports urinary urgency.  Denies frequency, pain with urination, burning sensation, blood in urine, odor or discharge. Musculoskeletal: Denies decrease in range of motion, difficulty with gait, muscle pain or joint pain and swelling.  Skin: Denies redness, rashes, lesions or ulcercations.  Neurological: Denies dizziness, difficulty with memory, difficulty with speech or problems with balance and coordination.  Psych: Denies anxiety, depression, SI/HI.  No other specific complaints in a complete review of systems (except as listed in HPI above).     Objective:   Physical Exam BP (!) 82/52 (BP Location: Left Arm, Patient Position: Sitting, Cuff Size: Normal)   Ht 5\' 3"  (1.6 m)   Wt 164 lb (74.4 kg)   BMI 29.05 kg/m    Wt Readings from Last 3 Encounters:  06/01/23 172 lb (78 kg)  03/12/23 181 lb 9.6 oz (82.4 kg)  01/18/23 179 lb (81.2 kg)    General: Appears her stated age, obese, in NAD. Skin: Warm, dry and intact. No ulcerations noted. HEENT: Head: normal shape and size; Eyes: sclera white, no icterus,  conjunctiva pink, PERRLA and EOMs intact;  Cardiovascular: Normal rate and rhythm. S1,S2 noted.  No murmur, rubs or gallops noted. No JVD or BLE edema. No carotid bruits noted. Pulmonary/Chest: Normal effort and positive vesicular breath sounds. No respiratory distress. No wheezes, rales or ronchi noted.  Abdomen: Soft and nontender. Normal bowel sounds.  Musculoskeletal: No difficulty with gait.  Neurological: Alert and oriented. Coordination normal.  Psychiatric: Mood and affect normal. Behavior is normal. Judgment and thought content normal.   BMET    Component Value Date/Time   NA 139 01/18/2023 0900   NA 139 06/12/2019 0840   K 4.5 01/18/2023 0900   CL 106 01/18/2023 0900   CO2 23 01/18/2023 0900   GLUCOSE 223 (H) 01/18/2023 0900   BUN 26 (H) 01/18/2023 0900   BUN 28 (H) 06/12/2019 0840   CREATININE 1.47 (H) 01/18/2023 0900   CALCIUM 9.6 01/18/2023 0900   GFRNONAA 42 (L) 02/10/2022 0928   GFRAA >60 11/03/2019 1139    Lipid Panel     Component Value Date/Time   CHOL 133 01/18/2023 0900   TRIG 195 (H) 01/18/2023 0900   HDL 50 01/18/2023 0900   CHOLHDL 2.7 01/18/2023 0900   VLDL 53 (H) 08/31/2020 0922   LDLCALC 56 01/18/2023 0900    CBC    Component Value Date/Time   WBC 8.4 01/18/2023 0900   RBC 4.37 01/18/2023 0900   HGB 12.0 01/18/2023 0900   HCT 37.7 01/18/2023 0900   PLT 248 01/18/2023 0900   MCV 86.3 01/18/2023 0900   MCH 27.5 01/18/2023 0900   MCHC 31.8 (L) 01/18/2023 0900   RDW 14.4 01/18/2023 0900    Hgb A1C Lab Results  Component Value Date   HGBA1C 9.1 (H) 01/18/2023          Assessment & Plan:     RTC in 6 months, follow-up chronic conditions Nicki Reaper, NP

## 2023-07-19 NOTE — Assessment & Plan Note (Signed)
 Avoid foods that trigger reflux Encourage weight loss as this can help reduce reflux symptoms Continue tums as needed

## 2023-07-19 NOTE — Assessment & Plan Note (Signed)
Continue oxybutynin Encouraged timed voiding and Kegel exercises

## 2023-07-20 ENCOUNTER — Telehealth: Payer: Self-pay | Admitting: Cardiology

## 2023-07-20 ENCOUNTER — Encounter: Payer: Self-pay | Admitting: Internal Medicine

## 2023-07-20 DIAGNOSIS — N184 Chronic kidney disease, stage 4 (severe): Secondary | ICD-10-CM

## 2023-07-20 LAB — LIPID PANEL
Cholesterol: 106 mg/dL (ref <200)
HDL: 42 mg/dL — ABNORMAL LOW (ref >=50)
LDL Cholesterol (Calc): 44 mg/dL
Non-HDL Cholesterol (Calc): 64 mg/dL (ref <130)
Total CHOL/HDL Ratio: 2.5 (calc) (ref <5.0)
Triglycerides: 123 mg/dL (ref <150)

## 2023-07-20 LAB — COMPREHENSIVE METABOLIC PANEL WITH GFR
AG Ratio: 1.5 (calc) (ref 1.0–2.5)
ALT: 12 U/L (ref 6–29)
AST: 10 U/L (ref 10–35)
Albumin: 4.1 g/dL (ref 3.6–5.1)
Alkaline phosphatase (APISO): 40 U/L (ref 37–153)
BUN/Creatinine Ratio: 17 (calc) (ref 6–22)
BUN: 44 mg/dL — ABNORMAL HIGH (ref 7–25)
CO2: 22 mmol/L (ref 20–32)
Calcium: 9.3 mg/dL (ref 8.6–10.4)
Chloride: 108 mmol/L (ref 98–110)
Creat: 2.55 mg/dL — ABNORMAL HIGH (ref 0.50–1.05)
Globulin: 2.7 g/dL (ref 1.9–3.7)
Glucose, Bld: 199 mg/dL — ABNORMAL HIGH (ref 65–139)
Potassium: 4.4 mmol/L (ref 3.5–5.3)
Sodium: 139 mmol/L (ref 135–146)
Total Bilirubin: 0.3 mg/dL (ref 0.2–1.2)
Total Protein: 6.8 g/dL (ref 6.1–8.1)
eGFR: 20 mL/min/{1.73_m2} — ABNORMAL LOW (ref >=60)

## 2023-07-20 LAB — CBC
HCT: 29.7 % — ABNORMAL LOW (ref 35.0–45.0)
Hemoglobin: 9.8 g/dL — ABNORMAL LOW (ref 11.7–15.5)
MCH: 28.7 pg (ref 27.0–33.0)
MCHC: 33 g/dL (ref 32.0–36.0)
MCV: 87.1 fL (ref 80.0–100.0)
MPV: 10.3 fL (ref 7.5–12.5)
Platelets: 230 10*3/uL (ref 140–400)
RBC: 3.41 10*6/uL — ABNORMAL LOW (ref 3.80–5.10)
RDW: 14.8 % (ref 11.0–15.0)
WBC: 10.2 10*3/uL (ref 3.8–10.8)

## 2023-07-20 LAB — HEMOGLOBIN A1C
Hgb A1c MFr Bld: 7.5 %{Hb} — ABNORMAL HIGH (ref <5.7)
Mean Plasma Glucose: 169 mg/dL
eAG (mmol/L): 9.3 mmol/L

## 2023-07-20 NOTE — Telephone Encounter (Signed)
 Pt c/o BP issue: STAT if pt c/o blurred vision, one-sided weakness or slurred speech.  1. What is your BP concern?  Saw PCP yesterday and BP was low, 81/51. Patient also had labs drawn and has concerns with kidney function + being told she has anemia now.  2. Have you taken any BP medication today? No BP medication.  3. What are your last 5 BP readings? Hasn't checked BP today 81/51 yesterday  4. Are you having any other symptoms (ex. Dizziness, headache, blurred vision, passed out)?  Patient is asymptomatic

## 2023-07-20 NOTE — Telephone Encounter (Signed)
 Patient called to report that her blood pressure has been running low. She noted that at her PCP's office yesterday, her BP was 81/51, and today it is 101/61. Patient stated she is asymptomatic but feels she may be overmedicated. She reported that her PCP did not make any changes and advised her to contact her cardiologist. Patient also expressed concern about her kidney function, referencing recent lab results available in Epic  Will forward to MD for recommendations.

## 2023-07-23 ENCOUNTER — Ambulatory Visit: Payer: Medicare Other

## 2023-07-23 DIAGNOSIS — I509 Heart failure, unspecified: Secondary | ICD-10-CM

## 2023-07-23 DIAGNOSIS — I255 Ischemic cardiomyopathy: Secondary | ICD-10-CM

## 2023-07-23 LAB — CUP PACEART REMOTE DEVICE CHECK
Battery Remaining Longevity: 58 mo
Battery Remaining Percentage: 56 %
Battery Voltage: 2.96 V
Brady Statistic RV Percent Paced: 1 %
Date Time Interrogation Session: 20250414020016
HighPow Impedance: 77 Ohm
HighPow Impedance: 77 Ohm
Implantable Lead Connection Status: 753985
Implantable Lead Implant Date: 20200707
Implantable Lead Location: 753860
Implantable Pulse Generator Implant Date: 20200707
Lead Channel Impedance Value: 580 Ohm
Lead Channel Pacing Threshold Amplitude: 0.75 V
Lead Channel Pacing Threshold Pulse Width: 0.5 ms
Lead Channel Sensing Intrinsic Amplitude: 11.7 mV
Lead Channel Setting Pacing Amplitude: 2.5 V
Lead Channel Setting Pacing Pulse Width: 0.5 ms
Lead Channel Setting Sensing Sensitivity: 0.5 mV
Pulse Gen Serial Number: 9816687

## 2023-07-26 NOTE — Telephone Encounter (Signed)
 Called patient to discuss changes to medications. She was instructed to decrease carvedilol from 25 to 12.5 mg. She explained she already had 12.5 mg tablets at home. So instead of splitting the 25 mg in half, she will just take her 12.5 mg tablets and follow up as scheduled.

## 2023-08-02 ENCOUNTER — Encounter: Payer: Self-pay | Admitting: Internal Medicine

## 2023-08-03 LAB — HM DIABETES EYE EXAM

## 2023-08-10 ENCOUNTER — Other Ambulatory Visit: Payer: Self-pay | Admitting: Internal Medicine

## 2023-08-10 DIAGNOSIS — R350 Frequency of micturition: Secondary | ICD-10-CM

## 2023-08-14 NOTE — Telephone Encounter (Signed)
 Requested Prescriptions  Pending Prescriptions Disp Refills   oxybutynin  (DITROPAN -XL) 5 MG 24 hr tablet [Pharmacy Med Name: Oxybutynin  Chloride ER 5 MG Oral Tablet Extended Release 24 Hour] 90 tablet 1    Sig: TAKE 1 TABLET BY MOUTH AT  BEDTIME     Urology:  Bladder Agents Passed - 08/14/2023  9:52 AM      Passed - Valid encounter within last 12 months    Recent Outpatient Visits           3 weeks ago Type 2 diabetes mellitus with other specified complication, without long-term current use of insulin  [facility])   [place] [facility] [place], [place], [place]

## 2023-08-22 ENCOUNTER — Other Ambulatory Visit: Payer: Self-pay | Admitting: Nephrology

## 2023-08-22 DIAGNOSIS — N184 Chronic kidney disease, stage 4 (severe): Secondary | ICD-10-CM

## 2023-08-22 DIAGNOSIS — D631 Anemia in chronic kidney disease: Secondary | ICD-10-CM

## 2023-08-22 DIAGNOSIS — E1122 Type 2 diabetes mellitus with diabetic chronic kidney disease: Secondary | ICD-10-CM

## 2023-08-27 ENCOUNTER — Ambulatory Visit

## 2023-08-27 ENCOUNTER — Ambulatory Visit
Admission: RE | Admit: 2023-08-27 | Discharge: 2023-08-27 | Disposition: A | Discharge status: Home / Self Care | Source: Ambulatory Visit | Attending: Nephrology | Admitting: Nephrology

## 2023-08-27 DIAGNOSIS — D631 Anemia in chronic kidney disease: Secondary | ICD-10-CM | POA: Diagnosis present

## 2023-08-27 DIAGNOSIS — N184 Chronic kidney disease, stage 4 (severe): Secondary | ICD-10-CM | POA: Diagnosis present

## 2023-08-27 DIAGNOSIS — E1122 Type 2 diabetes mellitus with diabetic chronic kidney disease: Secondary | ICD-10-CM | POA: Diagnosis present

## 2023-09-13 NOTE — Progress Notes (Signed)
 Remote ICD transmission.

## 2023-09-18 ENCOUNTER — Other Ambulatory Visit (HOSPITAL_COMMUNITY): Payer: Self-pay | Admitting: Cardiology

## 2023-09-21 ENCOUNTER — Encounter: Payer: Self-pay | Admitting: Cardiology

## 2023-09-21 MED ORDER — ICOSAPENT ETHYL 1 G PO CAPS: 2.0000 g | ORAL_CAPSULE | Freq: Two times a day (BID) | ORAL | 2 refills | Status: DC

## 2023-09-21 NOTE — Addendum Note (Signed)
 Addended by: Gayleen Kawasaki D on: 09/21/2023 01:37 PM   Modules accepted: Orders

## 2023-10-22 ENCOUNTER — Ambulatory Visit: Payer: Medicare Other

## 2023-10-22 DIAGNOSIS — I255 Ischemic cardiomyopathy: Secondary | ICD-10-CM

## 2023-10-23 LAB — CUP PACEART REMOTE DEVICE CHECK
Battery Remaining Longevity: 56 mo
Battery Remaining Percentage: 54 %
Battery Voltage: 2.95 V
Brady Statistic RV Percent Paced: 1 %
Date Time Interrogation Session: 20250714020017
HighPow Impedance: 90 Ohm
HighPow Impedance: 90 Ohm
Implantable Lead Connection Status: 753985
Implantable Lead Implant Date: 20200707
Implantable Lead Location: 753860
Implantable Pulse Generator Implant Date: 20200707
Lead Channel Impedance Value: 650 Ohm
Lead Channel Pacing Threshold Amplitude: 0.75 V
Lead Channel Pacing Threshold Pulse Width: 0.5 ms
Lead Channel Sensing Intrinsic Amplitude: 11.7 mV
Lead Channel Setting Pacing Amplitude: 2.5 V
Lead Channel Setting Pacing Pulse Width: 0.5 ms
Lead Channel Setting Sensing Sensitivity: 0.5 mV
Pulse Gen Serial Number: 9816687

## 2023-11-14 LAB — HEMOGLOBIN A1C: Hemoglobin A1C: 7.4

## 2023-12-08 ENCOUNTER — Other Ambulatory Visit: Payer: Self-pay | Admitting: Internal Medicine

## 2023-12-08 DIAGNOSIS — R350 Frequency of micturition: Secondary | ICD-10-CM

## 2023-12-11 NOTE — Telephone Encounter (Signed)
 To soon for refill, LRF 08/14/23 for 90 and 1 RF.  Requested Prescriptions  Pending Prescriptions Disp Refills   oxybutynin  (DITROPAN -XL) 5 MG 24 hr tablet [Pharmacy Med Name: Oxybutynin  Chloride ER 5 MG Oral Tablet Extended Release 24 Hour] 90 tablet 3    Sig: TAKE 1 TABLET BY MOUTH AT  BEDTIME     Urology:  Bladder Agents Passed - 12/11/2023 11:19 AM      Passed - Valid encounter within last 12 months    Recent Outpatient Visits           4 months ago Type 2 diabetes mellitus with other specified complication, without long-term current use of insulin  [facility])   [place] [facility] [place], [place], [place]

## 2023-12-12 ENCOUNTER — Other Ambulatory Visit: Payer: Self-pay | Admitting: Internal Medicine

## 2023-12-12 DIAGNOSIS — R350 Frequency of micturition: Secondary | ICD-10-CM

## 2023-12-12 NOTE — Telephone Encounter (Unsigned)
 Copied from CRM #[id number]. Topic: Clinical - Medication Refill >> Dec 12, 2023  1:52 PM Monique Franklin wrote: Medication: oxybutynin  (DITROPAN -XL) 5 MG 24 hr tablet  Has the patient contacted their pharmacy? Yes (Agent: If no, request that the patient contact the pharmacy for the refill. If patient does not wish to contact the pharmacy document the reason why and proceed with request.) (Agent: If yes, when and what did the pharmacy advise?)  This is the patient's preferred pharmacy:   [facility] [place] [facility], [place] - [address] [place] [address] Phone: [phone] Fax: [phone]  Is this the correct pharmacy for this prescription? Yes If no, delete pharmacy and type the correct one.   Has the prescription been filled recently? Yes  Is the patient out of the medication? Yes  Has the patient been seen for an appointment in the last year OR does the patient have an upcoming appointment? Yes  Can we respond through MyChart? No  Agent: Please be advised that Rx refills may take up to 3 business days. We ask that you follow-up with your pharmacy.

## 2023-12-13 NOTE — Telephone Encounter (Signed)
 Duplicate request, too soon for refill.  Requested Prescriptions  Pending Prescriptions Disp Refills   oxybutynin  (DITROPAN -XL) 5 MG 24 hr tablet 90 tablet 1    Sig: Take 1 tablet (5 mg total) by mouth at bedtime.     Urology:  Bladder Agents Passed - 12/13/2023 11:59 AM      Passed - Valid encounter within last 12 months    Recent Outpatient Visits           4 months ago Type 2 diabetes mellitus with other specified complication, without long-term current use of insulin  [facility])   [place] [facility] [place], [place], [place]

## 2023-12-13 NOTE — Telephone Encounter (Signed)
 Called pharmacy - Rx was mailed 11/20/2023 #90. Rx was delivered 8/142025. Called pt - she will check at home tonight. She does not think she has it. Will call back.

## 2024-01-13 ENCOUNTER — Other Ambulatory Visit: Payer: Self-pay | Admitting: Internal Medicine

## 2024-01-13 DIAGNOSIS — R350 Frequency of micturition: Secondary | ICD-10-CM

## 2024-01-15 NOTE — Telephone Encounter (Signed)
 Requested Prescriptions  Pending Prescriptions Disp Refills   oxybutynin  (DITROPAN -XL) 5 MG 24 hr tablet [Pharmacy Med Name: Oxybutynin  Chloride ER 5 MG Oral Tablet Extended Release 24 Hour] 90 tablet 1    Sig: TAKE 1 TABLET BY MOUTH AT  BEDTIME     Urology:  Bladder Agents Passed - 01/15/2024 12:34 PM      Passed - Valid encounter within last 12 months    Recent Outpatient Visits           6 months ago Type 2 diabetes mellitus with other specified complication, without long-term current use of insulin  [facility])   [facility] [place], [place], [place]

## 2024-01-17 NOTE — Progress Notes (Signed)
 Remote ICD Transmission

## 2024-01-21 ENCOUNTER — Ambulatory Visit (INDEPENDENT_AMBULATORY_CARE_PROVIDER_SITE_OTHER): Payer: Medicare Other

## 2024-01-21 DIAGNOSIS — I255 Ischemic cardiomyopathy: Secondary | ICD-10-CM | POA: Diagnosis not present

## 2024-01-21 LAB — CUP PACEART REMOTE DEVICE CHECK
Battery Remaining Longevity: 54 mo
Battery Remaining Percentage: 52 %
Battery Voltage: 2.95 V
Brady Statistic RV Percent Paced: 1 %
Date Time Interrogation Session: 20251013020019
HighPow Impedance: 79 Ohm
HighPow Impedance: 79 Ohm
Implantable Lead Connection Status: 753985
Implantable Lead Implant Date: 20200707
Implantable Lead Location: 753860
Implantable Pulse Generator Implant Date: 20200707
Lead Channel Impedance Value: 710 Ohm
Lead Channel Pacing Threshold Amplitude: 0.75 V
Lead Channel Pacing Threshold Pulse Width: 0.5 ms
Lead Channel Sensing Intrinsic Amplitude: 11.7 mV
Lead Channel Setting Pacing Amplitude: 2.5 V
Lead Channel Setting Pacing Pulse Width: 0.5 ms
Lead Channel Setting Sensing Sensitivity: 0.5 mV
Pulse Gen Serial Number: 9816687

## 2024-01-22 ENCOUNTER — Encounter: Payer: Self-pay | Admitting: Internal Medicine

## 2024-01-22 ENCOUNTER — Ambulatory Visit: Admitting: Internal Medicine

## 2024-01-22 VITALS — BP 98/64 | Ht 63.0 in | Wt 164.8 lb

## 2024-01-22 DIAGNOSIS — I5032 Chronic diastolic (congestive) heart failure: Secondary | ICD-10-CM

## 2024-01-22 DIAGNOSIS — E1169 Type 2 diabetes mellitus with other specified complication: Secondary | ICD-10-CM | POA: Diagnosis not present

## 2024-01-22 DIAGNOSIS — Z794 Long term (current) use of insulin: Secondary | ICD-10-CM

## 2024-01-22 DIAGNOSIS — I428 Other cardiomyopathies: Secondary | ICD-10-CM

## 2024-01-22 DIAGNOSIS — Z6829 Body mass index (BMI) 29.0-29.9, adult: Secondary | ICD-10-CM

## 2024-01-22 DIAGNOSIS — I252 Old myocardial infarction: Secondary | ICD-10-CM

## 2024-01-22 DIAGNOSIS — Z23 Encounter for immunization: Secondary | ICD-10-CM | POA: Diagnosis not present

## 2024-01-22 DIAGNOSIS — E1159 Type 2 diabetes mellitus with other circulatory complications: Secondary | ICD-10-CM | POA: Diagnosis not present

## 2024-01-22 DIAGNOSIS — N3281 Overactive bladder: Secondary | ICD-10-CM

## 2024-01-22 DIAGNOSIS — N1832 Chronic kidney disease, stage 3b: Secondary | ICD-10-CM

## 2024-01-22 DIAGNOSIS — K219 Gastro-esophageal reflux disease without esophagitis: Secondary | ICD-10-CM

## 2024-01-22 DIAGNOSIS — I739 Peripheral vascular disease, unspecified: Secondary | ICD-10-CM

## 2024-01-22 DIAGNOSIS — I2510 Atherosclerotic heart disease of native coronary artery without angina pectoris: Secondary | ICD-10-CM

## 2024-01-22 DIAGNOSIS — I152 Hypertension secondary to endocrine disorders: Secondary | ICD-10-CM

## 2024-01-22 DIAGNOSIS — E785 Hyperlipidemia, unspecified: Secondary | ICD-10-CM

## 2024-01-22 DIAGNOSIS — Z7985 Long-term (current) use of injectable non-insulin antidiabetic drugs: Secondary | ICD-10-CM

## 2024-01-22 DIAGNOSIS — Z7984 Long term (current) use of oral hypoglycemic drugs: Secondary | ICD-10-CM

## 2024-01-22 NOTE — Assessment & Plan Note (Signed)
 Kidney function reviewed Continue valsartan 51 mg for renal protection She will continue to follow with nephrology

## 2024-01-22 NOTE — Assessment & Plan Note (Signed)
 A1c reviewed Urine microalbumin today Encourage low-carb diet and exercise for weight loss Continue toujeo  14 units daily, metformin  500 mg twice daily, Mounjaro 5 mg weekly and farxiga  10 mg daily Encourage routine eye exam Encouraged routine foot exam Flu shot today Pneumonia and COVID vaccines UTD

## 2024-01-22 NOTE — Assessment & Plan Note (Addendum)
 BP on the low end although she is not symptomatic Continue spironolactone  12.5 mg daily and sacubitril-valsartan 49-51 mg daily Reinforced DASH diet and exercise for weight loss Kidney function reviewed

## 2024-01-22 NOTE — Assessment & Plan Note (Signed)
 Encouraged diet and exercise for weight loss ?

## 2024-01-22 NOTE — Assessment & Plan Note (Signed)
 Avoid foods that trigger reflux Encourage weight loss as this can help reduce reflux symptoms Continue tums OTC as needed

## 2024-01-22 NOTE — Assessment & Plan Note (Signed)
 No angina Lipid profile reviewed Continue rosuvastatin  40 mg, vascepa  2 g and aspirin 81 mg daily Encourage low-fat diet

## 2024-01-22 NOTE — Assessment & Plan Note (Signed)
 Continue rosuvastatin  40 mg, vascepa  2 g and aspirin 81 mg daily Encourage low-fat diet

## 2024-01-22 NOTE — Assessment & Plan Note (Signed)
 Continue spironolactone  12.5 mg daily and sacubitril-valsartan 49-51 mg daily Reinforced DASH diet She will continue to follow with cardiology

## 2024-01-22 NOTE — Assessment & Plan Note (Signed)
 Lipid profile reviewed Continue rosuvastatin  40 mg and vascepa  2 g daily Encouraged low-fat fat diet

## 2024-01-22 NOTE — Assessment & Plan Note (Signed)
 Compensated Continue sacubitril-valsartan 49-51 mg and spironolactone  12.5 mg daily Encourage low-salt diet Monitor daily weights Kidney function reviewed

## 2024-01-22 NOTE — Patient Instructions (Signed)

## 2024-01-22 NOTE — Progress Notes (Signed)
 Subjective:    Patient ID: Corayma Cashatt, female    DOB: 27-Mar-1956, 68 y.o.   MRN: [id number]  HPI  Patient presents to clinic today for 42-month follow-up of chronic conditions.  HLD with CAD status post MI with PAD: Her last LDL was 44, triglycerides 876, 07/2023.  She denies myalgias on rosuvastatin  and vascepa .  She is taking aspirin as well.  She tries to consume a low-fat diet.  DM 2: Her last A1c 7.4 %, 11/2023.  She is taking toujeo , metformin , mounjaro, and farxiga  as prescribed.  Her sugars range 64-250.  She checks her feet routinely.  Her last eye exam was 07/2023.  Flu 01/2023.  Prevnar 20 10/2020.  COVID Pfizer x 3.  She follows with endocrinology.  CKD 3: Her last creatinine was 1.77, GFR 31, 12/2023.  She is on valsartan for renal protection.  She follows with nephrology.  HTN with cardiomyopathy: Her BP today is 98/64.  She is taking spironolactone  and entresto  as prescribed.  She was recently taken off her carvedilol .  ECG from 05/2023 reviewed.  CHF: She denies current cough, shortness of breath or lower extremity edema.  She is taking spironolactone  and entresto  as prescribed.  She was recently taken off her carvedilol .  Echo from 03/2022 reviewed.  GERD: Triggered by tomato based foods and laying down after eating.  She takes tums as needed with good relief of symptoms.  There is no upper GI on file.  OAB: She reports mainly urinary urgency.  She takes oxybutynin  as prescribed.  She does not follow with urology.  Review of Systems  Past Medical History:  Diagnosis Date  . Cardiomyopathy (HCC)   . CHF (congestive heart failure) (HCC)    Class I  . Coronary artery disease   . Diabetes mellitus without complication (HCC)   . GERD (gastroesophageal reflux disease)   . HFrEF (heart failure with reduced ejection fraction) (HCC)   . Hyperlipidemia   . Hypertension   . MI (myocardial infarction) (HCC)   . PAD (peripheral artery disease)     Current Outpatient  Medications  Medication Sig Dispense Refill  . aspirin EC 81 MG tablet Take 81 mg by mouth daily.    . carvedilol  (COREG ) 12.5 MG tablet Take 1 tablet (12.5 mg total) by mouth 2 (two) times daily. 180 tablet 3  . clobetasol  ointment (TEMOVATE ) 0.05 % Apply to affected area every night for 4 weeks, then every other day for 4 weeks and then twice a week for 4 weeks or until resolution. 30 g 5  . dapagliflozin  propanediol (FARXIGA ) 10 MG TABS tablet TAKE 1 TABLET BY MOUTH DAILY 90 tablet 2  . icosapent  Ethyl (VASCEPA ) 1 g capsule Take 2 capsules (2 g total) by mouth 2 (two) times daily. 360 capsule 2  . insulin  glargine, 2 Unit Dial, (TOUJEO  MAX SOLOSTAR) 300 UNIT/ML Solostar Pen INJECT 14 UNITS INTO THE SKIN  DAILY 6 mL 0  . metFORMIN  (GLUCOPHAGE ) 1000 MG tablet TAKE ONE-HALF TABLET BY MOUTH  TWICE DAILY WITH A MEAL 90 tablet 0  . Multiple Vitamin (MULTIVITAMIN) tablet Take 1 tablet by mouth daily.    . oxybutynin  (DITROPAN -XL) 5 MG 24 hr tablet TAKE 1 TABLET BY MOUTH AT  BEDTIME 90 tablet 1  . rosuvastatin  (CRESTOR ) 40 MG tablet TAKE 1 TABLET BY MOUTH DAILY 90 tablet 3  . sacubitril-valsartan (ENTRESTO ) 49-51 MG Take 1 tablet by mouth 2 (two) times daily. 180 tablet 3  . spironolactone  (ALDACTONE ) 25 MG  tablet Take 0.5 tablets (12.5 mg total) by mouth daily. 45 tablet 3  . TRULICITY 0.75 MG/0.5ML SOAJ Inject 0.75 mg into the skin once a week.     No current facility-administered medications for this visit.    Allergies  Allergen Reactions  . Ticagrelor Rash and Itching  . Chlorhexidine Itching    Only w/ wipes  . Tape Rash    Family History  Problem Relation Age of Onset  . Heart attack Mother   . COPD Father   . Thyroid cancer Sister   . Breast cancer Sister 47  . Diabetes Sister   . Diabetes Brother     Social History   Socioeconomic History  . Marital status: Widowed    Spouse name: Not on file  . Number of children: Not on file  . Years of education: Not on file  .  Highest education level: Some college, no degree  Occupational History  . Not on file  Tobacco Use  . Smoking status: Former    Current packs/day: 0.00    Average packs/day: 1 pack/day for 33.0 years (33.0 ttl pk-yrs)    Types: Cigarettes    Start date: 60    Quit date: 2005    Years since quitting: 20.7  . Smokeless tobacco: Never  Vaping Use  . Vaping status: Never Used  Substance and Sexual Activity  . Alcohol use: Yes    Comment: occasional  . Drug use: Never  . Sexual activity: Not Currently    Birth control/protection: Post-menopausal  Other Topics Concern  . Not on file  Social History Narrative  . Not on file   Social Drivers of Health   Financial Resource Strain: Low Risk  (01/18/2024)   Overall Financial Resource Strain (CARDIA)   . Difficulty of Paying Living Expenses: Not hard at all  Food Insecurity: No Food Insecurity (01/18/2024)   Hunger Vital Sign   . Worried About Programme researcher, broadcasting/film/video in the Last Year: Never true   . Ran Out of Food in the Last Year: Never true  Transportation Needs: No Transportation Needs (01/18/2024)   PRAPARE - Transportation   . Lack of Transportation (Medical): No   . Lack of Transportation (Non-Medical): No  Physical Activity: Insufficiently Active (01/18/2024)   Exercise Vital Sign   . Days of Exercise per Week: 2 days   . Minutes of Exercise per Session: 20 min  Stress: No Stress Concern Present (01/18/2024)   [employer] of Occupational Health - Occupational Stress Questionnaire   . Feeling of Stress: Not at all  Social Connections: Socially Isolated (01/18/2024)   Social Connection and Isolation Panel   . Frequency of Communication with Friends and Family: Three times a week   . Frequency of Social Gatherings with Friends and Family: Twice a week   . Attends Religious Services: Never   . Active Member of Clubs or Organizations: No   . Attends Banker Meetings: Not on file   . Marital Status: Widowed   Intimate Partner Violence: Not At Risk (07/19/2023)   Humiliation, Afraid, Rape, and Kick questionnaire   . Fear of Current or Ex-Partner: No   . Emotionally Abused: No   . Physically Abused: No   . Sexually Abused: No     Constitutional: Denies fever, malaise, fatigue, headache or abrupt weight changes.  HEENT: Denies eye pain, eye redness, ear pain, ringing in the ears, wax buildup, runny nose, nasal congestion, bloody nose, or sore throat. Respiratory: Denies  difficulty breathing, shortness of breath, cough or sputum production.   Cardiovascular: Denies chest pain, chest tightness, palpitations or swelling in the hands or feet.  Gastrointestinal: Patient reports constipation.  Denies abdominal pain, bloating, diarrhea or blood in the stool.  GU: Patient reports urinary urgency.  Denies frequency, pain with urination, burning sensation, blood in urine, odor or discharge. Musculoskeletal: Denies decrease in range of motion, difficulty with gait, muscle pain or joint pain and swelling.  Skin: Denies redness, rashes, lesions or ulcercations.  Neurological: Denies dizziness, difficulty with memory, difficulty with speech or problems with balance and coordination.  Psych: Denies anxiety, depression, SI/HI.  No other specific complaints in a complete review of systems (except as listed in HPI above).     Objective:   Physical Exam BP 98/64 (BP Location: Right Arm, Patient Position: Sitting, Cuff Size: Normal)   Ht 5' 3 (1.6 m)   Wt 164 lb 12.8 oz (74.8 kg)   BMI 29.19 kg/m     Wt Readings from Last 3 Encounters:  07/19/23 164 lb (74.4 kg)  07/19/23 164 lb (74.4 kg)  06/01/23 172 lb (78 kg)    General: Appears her stated age, overweight,, in NAD. Skin: Warm, dry and intact. No ulcerations noted. HEENT: Head: normal shape and size; Eyes: sclera white, no icterus, conjunctiva pink, PERRLA and EOMs intact;  Cardiovascular: Normal rate and rhythm. S1,S2 noted.  No murmur, rubs or  gallops noted. No JVD or BLE edema. No carotid bruits noted. Pulmonary/Chest: Normal effort and positive vesicular breath sounds. No respiratory distress. No wheezes, rales or ronchi noted.  Abdomen: Soft and nontender. Normal bowel sounds.  Musculoskeletal: No difficulty with gait.  Neurological: Alert and oriented. Coordination normal.  Psychiatric: Mood and affect normal. Behavior is normal. Judgment and thought content normal.   BMET    Component Value Date/Time   NA 139 07/19/2023 0829   NA 139 06/12/2019 0840   K 4.4 07/19/2023 0829   CL 108 07/19/2023 0829   CO2 22 07/19/2023 0829   GLUCOSE 199 (H) 07/19/2023 0829   BUN 44 (H) 07/19/2023 0829   BUN 28 (H) 06/12/2019 0840   CREATININE 2.55 (H) 07/19/2023 0829   CALCIUM  9.3 07/19/2023 0829   GFRNONAA 42 (L) 02/10/2022 0928   GFRAA >60 11/03/2019 1139    Lipid Panel     Component Value Date/Time   CHOL 106 07/19/2023 0829   TRIG 123 07/19/2023 0829   HDL 42 (L) 07/19/2023 0829   CHOLHDL 2.5 07/19/2023 0829   VLDL 53 (H) 08/31/2020 0922   LDLCALC 44 07/19/2023 0829    CBC    Component Value Date/Time   WBC 10.2 07/19/2023 0829   RBC 3.41 (L) 07/19/2023 0829   HGB 9.8 (L) 07/19/2023 0829   HCT 29.7 (L) 07/19/2023 0829   PLT 230 07/19/2023 0829   MCV 87.1 07/19/2023 0829   MCH 28.7 07/19/2023 0829   MCHC 33.0 07/19/2023 0829   RDW 14.8 07/19/2023 0829    Hgb A1C Lab Results  Component Value Date   HGBA1C 7.5 (H) 07/19/2023          Assessment & Plan:     RTC in 6 months, follow-up chronic conditions [place], NP

## 2024-01-22 NOTE — Assessment & Plan Note (Signed)
 No angina Continue rosuvastatin  40 mg, vascepa  2 g and aspirin 81 mg daily Encourage low-fat diet

## 2024-01-22 NOTE — Assessment & Plan Note (Signed)
 Continue oxybutynin  5 mg XL daily Encouraged timed voiding and kegel exercises

## 2024-01-23 ENCOUNTER — Ambulatory Visit: Payer: Self-pay | Admitting: Internal Medicine

## 2024-01-23 LAB — MICROALBUMIN / CREATININE URINE RATIO
Creatinine, Urine: 107 mg/dL (ref 20–275)
Microalb Creat Ratio: 77 mg/g{creat} — ABNORMAL HIGH (ref <30)
Microalb, Ur: 8.2 mg/dL

## 2024-01-23 NOTE — Progress Notes (Signed)
 Remote ICD Transmission

## 2024-02-16 ENCOUNTER — Ambulatory Visit: Payer: Self-pay | Admitting: Cardiology

## 2024-03-10 ENCOUNTER — Telehealth: Payer: Self-pay | Admitting: Cardiology

## 2024-03-10 DIAGNOSIS — I428 Other cardiomyopathies: Secondary | ICD-10-CM

## 2024-03-10 NOTE — Telephone Encounter (Signed)
 12/02 echo has been cancelled. Patient had a death in her family and she is in [place] . Order expires on 12/02. New order is needed or order needs to be extended. Please assist.

## 2024-03-10 NOTE — Telephone Encounter (Signed)
 New order placed as requested.

## 2024-03-11 ENCOUNTER — Ambulatory Visit: Payer: Medicare Other

## 2024-03-20 ENCOUNTER — Encounter: Payer: Self-pay | Admitting: Cardiology

## 2024-04-07 ENCOUNTER — Other Ambulatory Visit: Payer: Self-pay | Admitting: Cardiology

## 2024-04-21 ENCOUNTER — Ambulatory Visit: Payer: Medicare Other

## 2024-04-21 ENCOUNTER — Ambulatory Visit: Attending: Cardiovascular Disease

## 2024-04-21 DIAGNOSIS — I428 Other cardiomyopathies: Secondary | ICD-10-CM | POA: Insufficient documentation

## 2024-04-21 DIAGNOSIS — I255 Ischemic cardiomyopathy: Secondary | ICD-10-CM

## 2024-04-21 LAB — ECHOCARDIOGRAM COMPLETE
AR max vel: 2.15 cm2
AV Area VTI: 2.2 cm2
AV Area mean vel: 2.17 cm2
AV Mean grad: 4 mmHg
AV Peak grad: 6.8 mmHg
Ao pk vel: 1.3 m/s
Area-P 1/2: 5.02 cm2
S' Lateral: 4.05 cm

## 2024-04-22 LAB — CUP PACEART REMOTE DEVICE CHECK
Battery Remaining Longevity: 52 mo
Battery Remaining Percentage: 50 %
Battery Voltage: 2.95 V
Brady Statistic RV Percent Paced: 1 %
Date Time Interrogation Session: 20260112020019
HighPow Impedance: 80 Ohm
HighPow Impedance: 80 Ohm
Implantable Lead Connection Status: 753985
Implantable Lead Implant Date: 20200707
Implantable Lead Location: 753860
Implantable Pulse Generator Implant Date: 20200707
Lead Channel Impedance Value: 640 Ohm
Lead Channel Pacing Threshold Amplitude: 0.75 V
Lead Channel Pacing Threshold Pulse Width: 0.5 ms
Lead Channel Sensing Intrinsic Amplitude: 11.7 mV
Lead Channel Setting Pacing Amplitude: 2.5 V
Lead Channel Setting Pacing Pulse Width: 0.5 ms
Lead Channel Setting Sensing Sensitivity: 0.5 mV
Pulse Gen Serial Number: 9816687

## 2024-04-23 ENCOUNTER — Ambulatory Visit: Payer: Self-pay | Admitting: Cardiology

## 2024-04-24 ENCOUNTER — Encounter: Payer: Self-pay | Admitting: Cardiology

## 2024-04-24 ENCOUNTER — Ambulatory Visit: Attending: Cardiology | Admitting: Cardiology

## 2024-04-24 VITALS — BP 110/58 | HR 87 | Ht 63.0 in | Wt 163.0 lb

## 2024-04-24 DIAGNOSIS — I2510 Atherosclerotic heart disease of native coronary artery without angina pectoris: Secondary | ICD-10-CM | POA: Insufficient documentation

## 2024-04-24 DIAGNOSIS — Z95810 Presence of automatic (implantable) cardiac defibrillator: Secondary | ICD-10-CM | POA: Diagnosis not present

## 2024-04-24 DIAGNOSIS — I255 Ischemic cardiomyopathy: Secondary | ICD-10-CM | POA: Insufficient documentation

## 2024-04-24 NOTE — Patient Instructions (Signed)

## 2024-04-24 NOTE — Progress Notes (Signed)
 " Cardiology Office Note:    Date:  04/24/2024   ID:  Monique Franklin, DOB 05/27/1955, MRN [id number]  PCP:  Antonette Angeline ORN, NP  Cardiologist:  Redell Cave, MD  Electrophysiologist:  None   Referring MD: Antonette Angeline ORN, NP   Chief Complaint  Patient presents with   Follow-up    12 month follow up Echo on done on 04/21/24 / pt has been doing well with no complaints of chest pain, chest pressure or SOB, medication reviewed verbally with patient .     History of Present Illness:    Monique Franklin is a 69 y.o. female with a hx of hypertension, hyperlipidemia, former smoker x20+ years, CAD, PCI to LAD and LCx(5 stents total), status post CABG x2 (LIMA to LAD, SVG to OM 2019), HFrEF EF35-40%, s/p ICD 2020([employer] Jude[employer] device), PAD(left femoral thrombo-endarterectomy with patch angioplasty 12/2017) who presents for follow-up.  Doing okay, denies chest pain or shortness of breath, denies edema.  Was previously on carvedilol , this was reduced and eventually stopped due to hypotension with systolic in the 80s.  Follows up closely with nephrology due to CKD.  Tolerating Entresto  and spironolactone  as prescribed.   Prior notes Echo 03/2022 EF 30 to 35% Echo 02/2021 EF 35 to 40% Echo 01/2019 EF 30 to 35% Patient was originally seen to establish care.  She used to be followed at [facility] and later moved into the area.   After her CABG in 2019, she had a left femoral artery complication requiring left femoral patch angioplasty.  Earlier in 2020, she had an ICD placed due to low EF with ejection fraction of 31%.      Past Medical History:  Diagnosis Date   Cardiomyopathy (HCC)    CHF (congestive heart failure) (HCC)    Class I   Coronary artery disease    Diabetes mellitus without complication (HCC)    GERD (gastroesophageal reflux disease)    HFrEF (heart failure with reduced ejection fraction) (HCC)    Hyperlipidemia    Hypertension    MI (myocardial infarction) (HCC)    PAD  (peripheral artery disease)     Past Surgical History:  Procedure Laterality Date   BREAST BIOPSY Bilateral 2016   neg   CARDIAC CATHETERIZATION     CARDIAC DEFIBRILLATOR PLACEMENT     CATARACT EXTRACTION Bilateral    CATARACT EXTRACTION W/PHACO Right 11/09/2020   Procedure: CATARACT EXTRACTION PHACO AND INTRAOCULAR LENS PLACEMENT (IOC) RIGHT VIVITY  DIABETIC;  Surgeon: Jaye Fallow, MD;  Location: [facility];  Service: Ophthalmology;  Laterality: Right;  2.85 00:33.2   CATARACT EXTRACTION W/PHACO Left 11/30/2020   Procedure: CATARACT EXTRACTION PHACO AND INTRAOCULAR LENS PLACEMENT (IOC) LEFT DIABETIC VIVITY 3.86 00:28.3;  Surgeon: Jaye Fallow, MD;  Location: [facility];  Service: Ophthalmology;  Laterality: Left;   COLONOSCOPY WITH PROPOFOL  N/A 10/31/2019   Procedure: COLONOSCOPY WITH PROPOFOL ;  Surgeon: Janalyn Keene NOVAK, MD;  Location: [facility];  Service: Endoscopy;  Laterality: N/A;  priority 4   COLONOSCOPY WITH PROPOFOL  N/A 01/19/2022   Procedure: COLONOSCOPY WITH PROPOFOL ;  Surgeon: Therisa Bi, MD;  Location: [facility]COPY;  Service: Gastroenterology;  Laterality: N/A;   COMBINED AUGMENTATION MAMMAPLASTY AND ABDOMINOPLASTY     CORONARY ARTERY BYPASS GRAFT  08/2017   X2 with LIMA to LAD; 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;Severe 3 vessel CAD-PCI to LAD and LC x(5stents) Coronary dissection of LM to LAD    CORONARY ARTERY BYPASS GRAFT     CORONARY STENT PLACEMENT  PERCUTANEOUS CORONARY STENT INTERVENTION (PCI-S)     REDUCTION MAMMAPLASTY Bilateral 2017   TONSILLECTOMY      Current Medications: Current Meds  Medication Sig   aspirin EC 81 MG tablet Take 81 mg by mouth daily.   clobetasol  ointment (TEMOVATE ) 0.05 % Apply to affected area every night for 4 weeks, then every other day for 4 weeks and then twice a week for 4 weeks or until resolution.   dapagliflozin  propanediol (FARXIGA ) 10 MG TABS tablet TAKE 1 TABLET BY MOUTH DAILY   icosapent   Ethyl (VASCEPA ) 1 g capsule TAKE 2 CAPSULES BY MOUTH TWICE  DAILY   insulin  glargine, 2 Unit Dial, (TOUJEO  MAX SOLOSTAR) 300 UNIT/ML Solostar Pen INJECT 14 UNITS INTO THE SKIN  DAILY   MOUNJARO 5 MG/0.5ML Pen Inject 5 mg into the skin.   Multiple Vitamin (MULTIVITAMIN) tablet Take 1 tablet by mouth daily.   oxybutynin  (DITROPAN -XL) 5 MG 24 hr tablet TAKE 1 TABLET BY MOUTH AT  BEDTIME   rosuvastatin  (CRESTOR ) 40 MG tablet TAKE 1 TABLET BY MOUTH DAILY   sacubitril-valsartan (ENTRESTO ) 49-51 MG TAKE 1 TABLET BY MOUTH TWICE  DAILY   spironolactone  (ALDACTONE ) 25 MG tablet Take 0.5 tablets (12.5 mg total) by mouth daily.     Allergies:   Ticagrelor, Chlorhexidine, and Tape   Social History   Socioeconomic History   Marital status: Widowed    Spouse name: Not on file   Number of children: Not on file   Years of education: Not on file   Highest education level: Some college, no degree  Occupational History   Not on file  Tobacco Use   Smoking status: Former    Current packs/day: 0.00    Average packs/day: 1 pack/day for 33.0 years (33.0 ttl pk-yrs)    Types: Cigarettes    Start date: 81    Quit date: 2005    Years since quitting: 21.0   Smokeless tobacco: Never  Vaping Use   Vaping status: Never Used  Substance and Sexual Activity   Alcohol use: Yes    Comment: occasional   Drug use: Never   Sexual activity: Not Currently    Birth control/protection: Post-menopausal  Other Topics Concern   Not on file  Social History Narrative   Not on file   Social Drivers of Health   Tobacco Use: Medium Risk (04/24/2024)   Patient History    Smoking Tobacco Use: Former    Smokeless Tobacco Use: Never    Passive Exposure: Not on Actuary Strain: Low Risk (01/18/2024)   Overall Financial Resource Strain (CARDIA)    Difficulty of Paying Living Expenses: Not hard at all  Food Insecurity: No Food Insecurity (01/18/2024)   Epic    Worried About Programme Researcher, Broadcasting/film/video in the  Last Year: Never true    Ran Out of Food in the Last Year: Never true  Transportation Needs: No Transportation Needs (01/18/2024)   Epic    Lack of Transportation (Medical): No    Lack of Transportation (Non-Medical): No  Physical Activity: Insufficiently Active (01/18/2024)   Exercise Vital Sign    Days of Exercise per Week: 2 days    Minutes of Exercise per Session: 20 min  Stress: No Stress Concern Present (01/18/2024)   [employer] of Occupational Health - Occupational Stress Questionnaire    Feeling of Stress: Not at all  Social Connections: Socially Isolated (01/18/2024)   Social Connection and Isolation Panel    Frequency of Communication  with Friends and Family: Three times a week    Frequency of Social Gatherings with Friends and Family: Twice a week    Attends Religious Services: Never    Database Administrator or Organizations: No    Attends Engineer, Structural: Not on file    Marital Status: Widowed  Depression (PHQ2-9): Low Risk (01/22/2024)   Depression (PHQ2-9)    PHQ-2 Score: 0  Alcohol Screen: Low Risk (01/18/2024)   Alcohol Screen    Last Alcohol Screening Score (AUDIT): 1  Housing: Low Risk (01/18/2024)   Epic    Unable to Pay for Housing in the Last Year: No    Number of Times Moved in the Last Year: 0    Homeless in the Last Year: No  Utilities: Not At Risk (07/19/2023)   AHC Utilities    Threatened with loss of utilities: No  Health Literacy: Adequate Health Literacy (07/19/2023)   B1300 Health Literacy    Frequency of need for help with medical instructions: Never     Family History: The patient's family history includes Breast cancer (age of onset: 20) in her sister; COPD in her father; Diabetes in her brother and sister; Heart attack in her mother; Thyroid cancer in her sister.  ROS:   Please see the history of present illness.     All other systems reviewed and are negative.  EKGs/Labs/Other Studies Reviewed:    The following  studies were reviewed today:  EKG Interpretation Date/Time:  Thursday April 24 2024 08:42:18 EST Ventricular Rate:  87 PR Interval:  164 QRS Duration:  90 QT Interval:  356 QTC Calculation: 428 R Axis:   -15  Text Interpretation: Normal sinus rhythm Low voltage QRS Inferior infarct (cited on or before 01-Jun-2023) Confirmed by Darliss Rogue ([id number]) on 04/24/2024 9:24:00 AM    Recent Labs: 07/19/2023: ALT 12; BUN 44; Creat 2.55; Hemoglobin 9.8; Platelets 230; Potassium 4.4; Sodium 139  Recent Lipid Panel    Component Value Date/Time   CHOL 106 07/19/2023 0829   TRIG 123 07/19/2023 0829   HDL 42 (L) 07/19/2023 0829   CHOLHDL 2.5 07/19/2023 0829   VLDL 53 (H) 08/31/2020 0922   LDLCALC 44 07/19/2023 0829   LDLDIRECT 59.0 05/12/2020 1537    Physical Exam:    VS:  BP (!) 110/58 (BP Location: Left Arm, Patient Position: Sitting, Cuff Size: Normal)   Pulse 87   Ht 5' 3 (1.6 m)   Wt 163 lb (73.9 kg)   SpO2 99%   BMI 28.87 kg/m     Wt Readings from Last 3 Encounters:  04/24/24 163 lb (73.9 kg)  01/22/24 164 lb 12.8 oz (74.8 kg)  07/19/23 164 lb (74.4 kg)     GEN:  Well nourished, well developed in no acute distress HEENT: Normal NECK: No JVD; No carotid bruits CARDIAC: RRR, no murmurs, rubs, gallops RESPIRATORY:  Clear to auscultation without rales, wheezing or rhonchi  ABDOMEN: Soft, non-tender, non-distended MUSCULOSKELETAL:  No edema; No deformity  SKIN: Warm and dry NEUROLOGIC:  Alert and oriented x 3 PSYCHIATRIC:  Normal affect   ASSESSMENT:    1. Ischemic cardiomyopathy   2. Coronary artery disease involving native coronary artery of native heart without angina pectoris   3. ICD (implantable cardioverter-defibrillator) in place    PLAN:    In order of problems listed above:  Ischemic cardiomyopathy EF 30-35 on echo 1/26, s/p ICD.  NYHA class II symptoms.  Denies shortness of breath, euvolemic on exam.  BP low normal.  Coreg  previously stopped due to  low BP. Continue Entresto  49-51 mg twice daily, spironolactone  12.5 mg daily, Farxiga  10 mg daily.    CAD, PCI's, CABG x2 in 2019.  Denies chest pain. continue aspirin 81 mg, Crestor  40 mg daily. S/p AICD 2020.  Keep appointments with device clinic..  Follow-up in 12 months   Medication Adjustments/Labs and Tests Ordered: Current medicines are reviewed at length with the patient today.  Concerns regarding medicines are outlined above.  Orders Placed This Encounter  Procedures   EKG 12-Lead    No orders of the defined types were placed in this encounter.    Patient Instructions  Medication Instructions:  Your physician recommends that you continue on your current medications as directed. Please refer to the Current Medication list given to you today.   *If you need a refill on your cardiac medications before your next appointment, please call your pharmacy*  Lab Work: No labs ordered today  If you have labs (blood work) drawn today and your tests are completely normal, you will receive your results only by: MyChart Message (if you have MyChart) OR A paper copy in the mail If you have any lab test that is abnormal or we need to change your treatment, we will call you to review the results.  Testing/Procedures: No test ordered today   Follow-Up: At [facility], you and your health needs are our priority.  As part of our continuing mission to provide you with exceptional heart care, our providers are all part of one team.  This team includes your primary Cardiologist (physician) and Advanced Practice Providers or APPs (Physician Assistants and Nurse Practitioners) who all work together to provide you with the care you need, when you need it.  Your next appointment:   1 year(s)  Provider:   You may see Redell Cave, MD or one of the following Advanced Practice Providers on your designated Care Team:   Lonni Meager, NP Lesley Maffucci, PA-C Bernardino Bring,  PA-C Cadence Morton, PA-C Tylene Lunch, NP Barnie Hila, NP    We recommend signing up for the patient portal called MyChart.  Sign up information is provided on this After Visit Summary.  MyChart is used to connect with patients for Virtual Visits (Telemedicine).  Patients are able to view lab/test results, encounter notes, upcoming appointments, etc.  Non-urgent messages can be sent to your provider as well.   To learn more about what you can do with MyChart, go to [email or web address].               Signed, Redell Cave, MD  04/24/2024 9:45 AM    [facility] "

## 2024-04-25 NOTE — Progress Notes (Signed)
 Remote ICD Transmission

## 2024-04-27 ENCOUNTER — Ambulatory Visit: Payer: Self-pay | Admitting: Cardiology

## 2024-06-17 ENCOUNTER — Ambulatory Visit: Admitting: Cardiology

## 2024-07-21 ENCOUNTER — Ambulatory Visit: Payer: Medicare Other

## 2024-07-24 ENCOUNTER — Ambulatory Visit: Admitting: Internal Medicine

## 2024-07-25 ENCOUNTER — Ambulatory Visit

## 2024-07-30 ENCOUNTER — Ambulatory Visit

## 2024-10-20 ENCOUNTER — Ambulatory Visit: Payer: Medicare Other

## 2025-01-19 ENCOUNTER — Ambulatory Visit: Payer: Medicare Other

## 2025-04-20 ENCOUNTER — Ambulatory Visit: Payer: Medicare Other
